# Patient Record
Sex: Male | Born: 1970 | Race: Black or African American | Hispanic: No | Marital: Married | Smoking: Current every day smoker
Health system: Southern US, Community
[De-identification: ages and names within clinical notes are randomized; demographics above are authoritative.]

## PROBLEM LIST (undated history)

## (undated) DIAGNOSIS — L309 Dermatitis, unspecified: Secondary | ICD-10-CM

## (undated) DIAGNOSIS — M549 Dorsalgia, unspecified: Secondary | ICD-10-CM

## (undated) DIAGNOSIS — M1990 Unspecified osteoarthritis, unspecified site: Secondary | ICD-10-CM

## (undated) DIAGNOSIS — I10 Essential (primary) hypertension: Secondary | ICD-10-CM

## (undated) DIAGNOSIS — I219 Acute myocardial infarction, unspecified: Secondary | ICD-10-CM

## (undated) DIAGNOSIS — M5126 Other intervertebral disc displacement, lumbar region: Secondary | ICD-10-CM

## (undated) HISTORY — PX: NO PAST SURGERIES: SHX2092

---

## 2003-03-05 ENCOUNTER — Encounter: Payer: Self-pay | Admitting: Emergency Medicine

## 2003-03-05 ENCOUNTER — Emergency Department (HOSPITAL_COMMUNITY): Admission: EM | Admit: 2003-03-05 | Discharge: 2003-03-05 | Payer: Self-pay | Admitting: Emergency Medicine

## 2008-06-02 ENCOUNTER — Emergency Department (HOSPITAL_COMMUNITY): Admission: EM | Admit: 2008-06-02 | Discharge: 2008-06-02 | Payer: Self-pay | Admitting: Emergency Medicine

## 2008-09-21 ENCOUNTER — Emergency Department (HOSPITAL_COMMUNITY): Admission: EM | Admit: 2008-09-21 | Discharge: 2008-09-21 | Payer: Self-pay | Admitting: Emergency Medicine

## 2008-09-30 ENCOUNTER — Emergency Department (HOSPITAL_COMMUNITY): Admission: EM | Admit: 2008-09-30 | Discharge: 2008-09-30 | Payer: Self-pay | Admitting: Emergency Medicine

## 2008-10-04 ENCOUNTER — Emergency Department (HOSPITAL_COMMUNITY): Admission: EM | Admit: 2008-10-04 | Discharge: 2008-10-04 | Payer: Self-pay | Admitting: Emergency Medicine

## 2008-10-23 ENCOUNTER — Emergency Department (HOSPITAL_COMMUNITY): Admission: EM | Admit: 2008-10-23 | Discharge: 2008-10-23 | Payer: Self-pay | Admitting: Emergency Medicine

## 2008-12-11 ENCOUNTER — Emergency Department (HOSPITAL_COMMUNITY): Admission: EM | Admit: 2008-12-11 | Discharge: 2008-12-11 | Payer: Self-pay | Admitting: Emergency Medicine

## 2009-10-15 IMAGING — CR DG FEMUR 2V*L*
4 series · 4 of 4 positions shown · non-contrast
Comparison: None available.

CLINICAL DATA: Pain.

LEFT FEMUR - 2 VIEW

[view not recorded (1 of 4)]
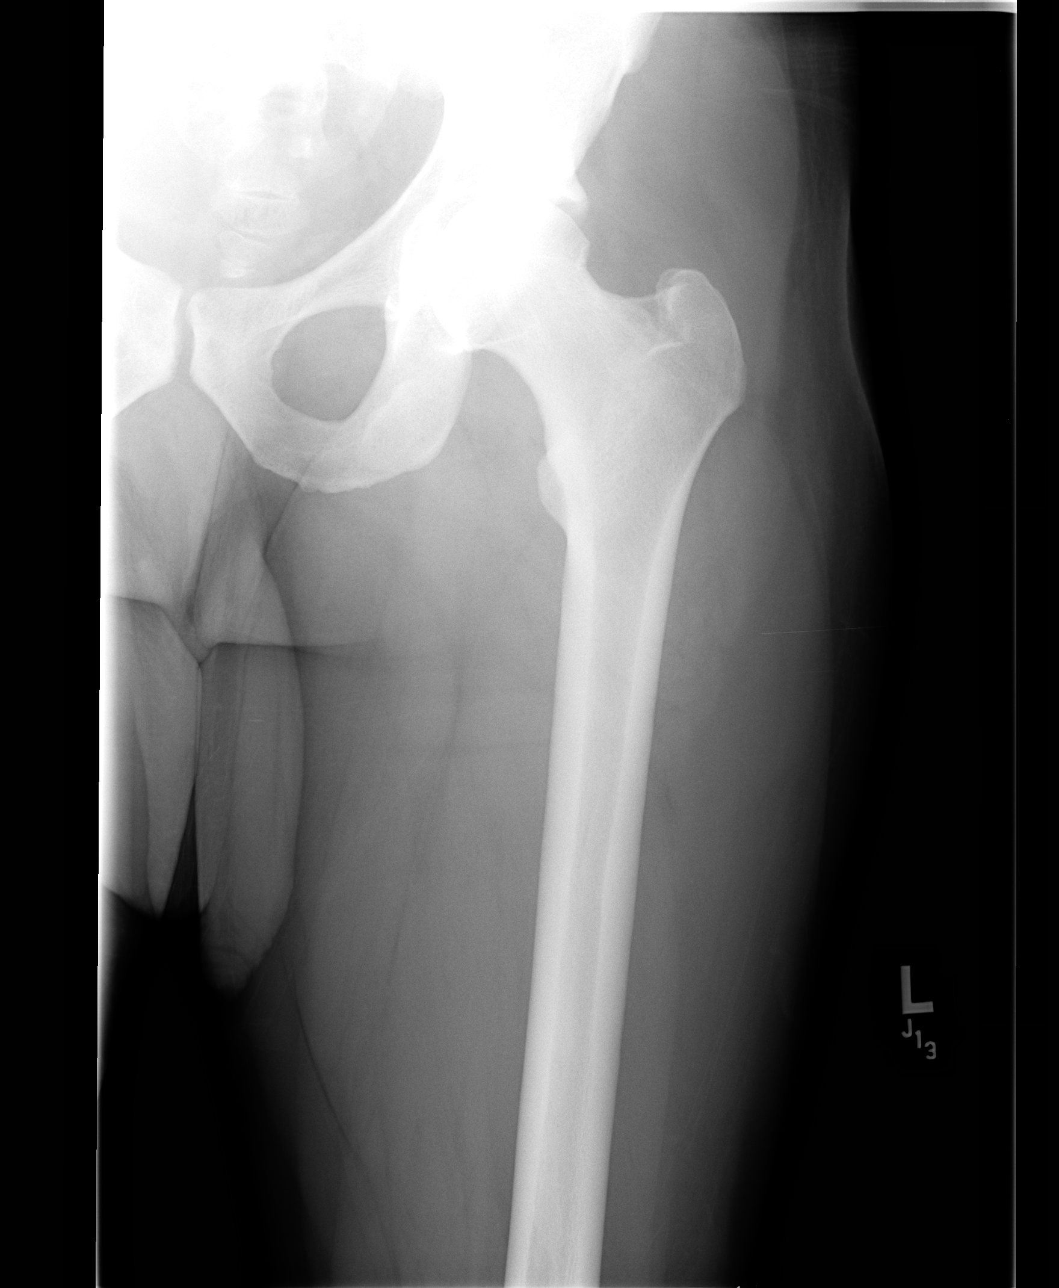

[view not recorded (2 of 4)]
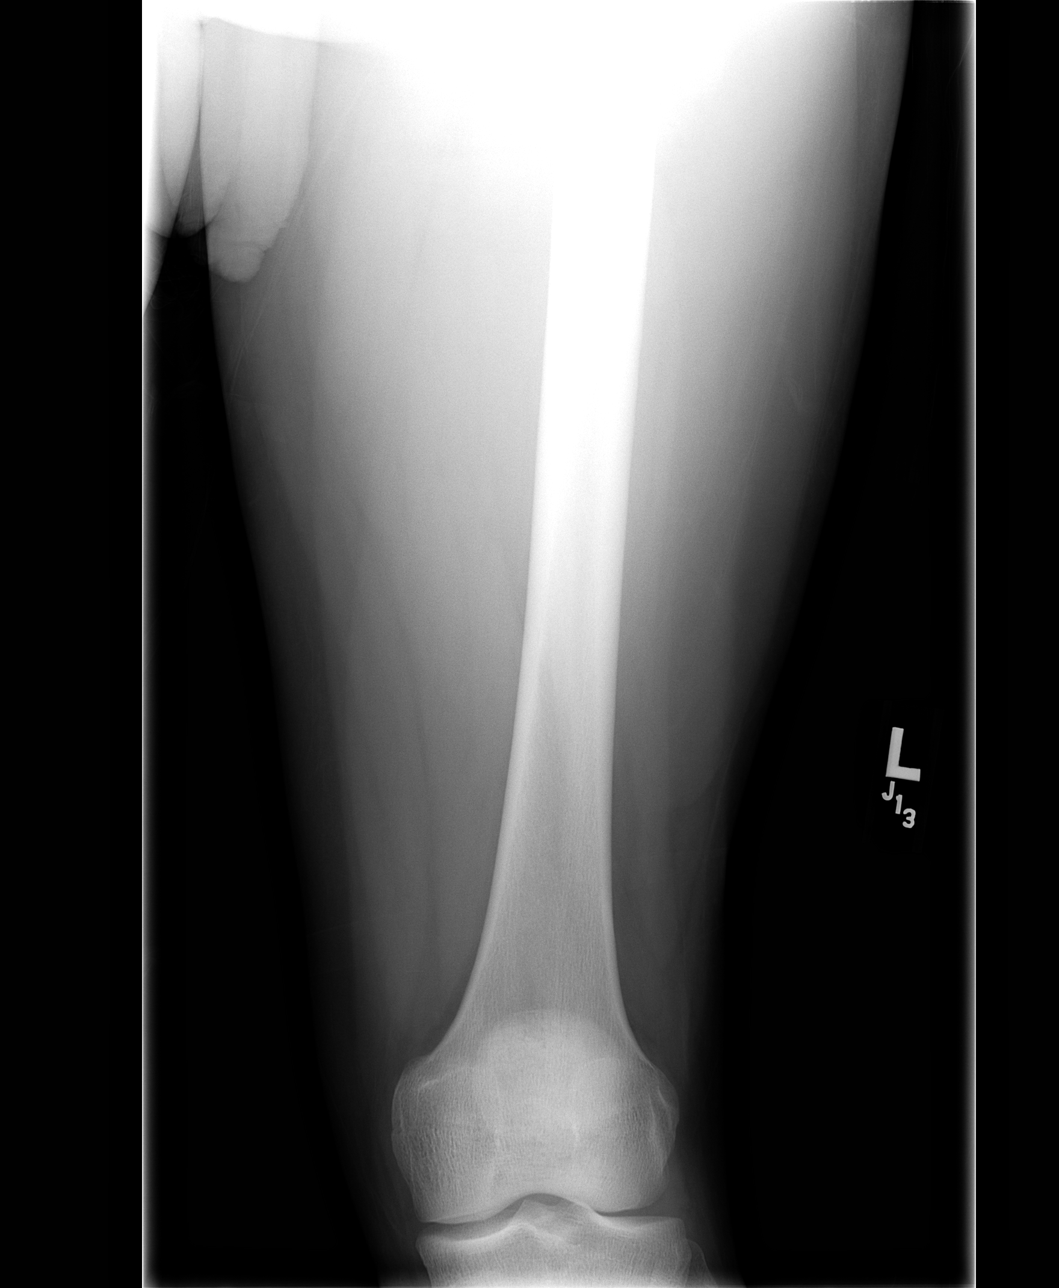

[view not recorded (3 of 4)]
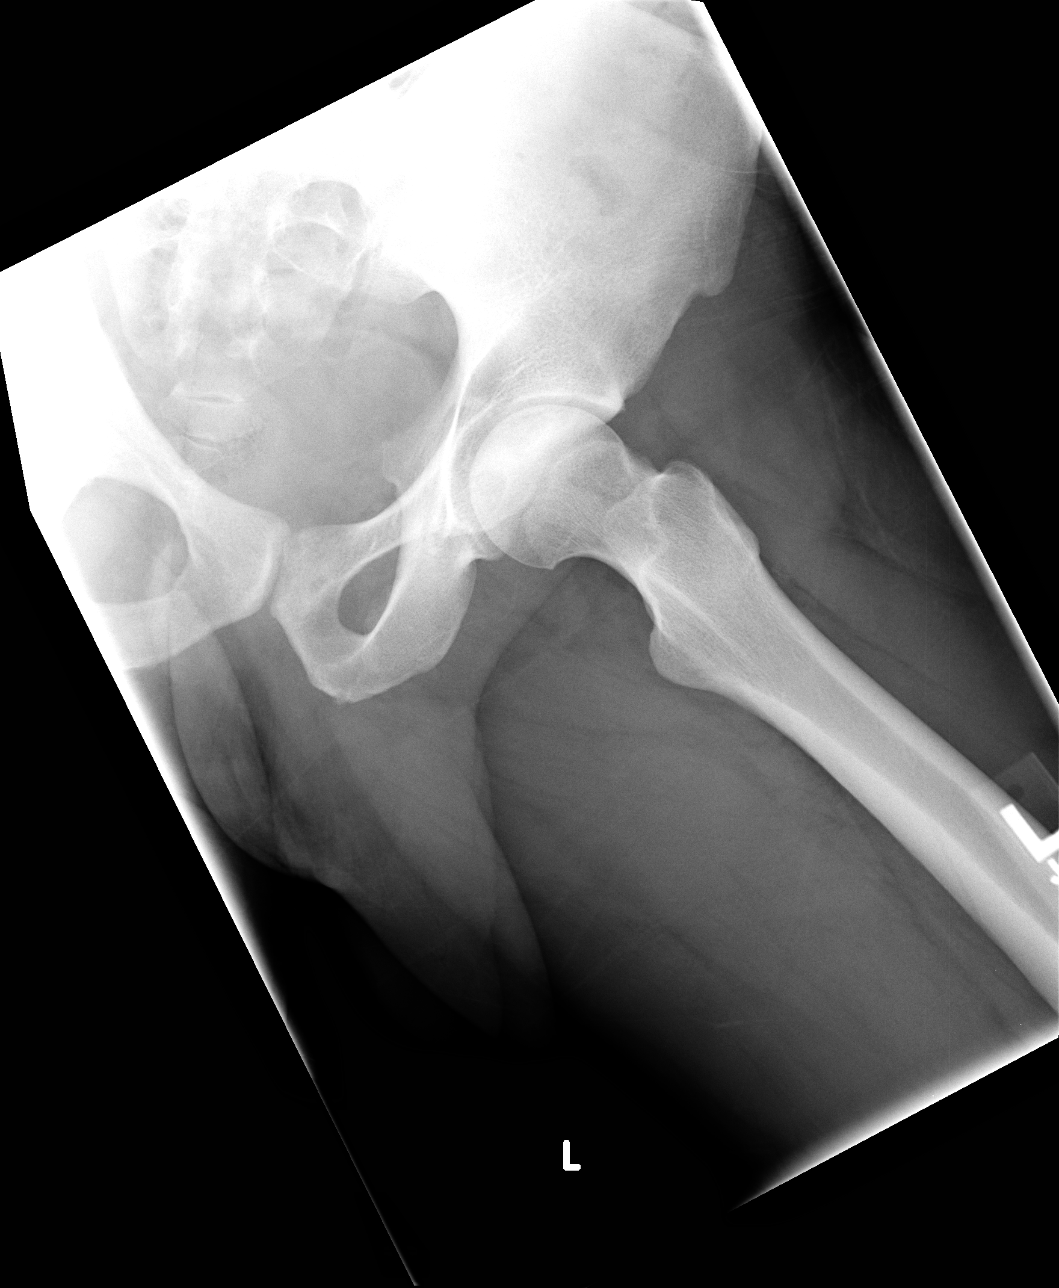

[view not recorded (4 of 4)]
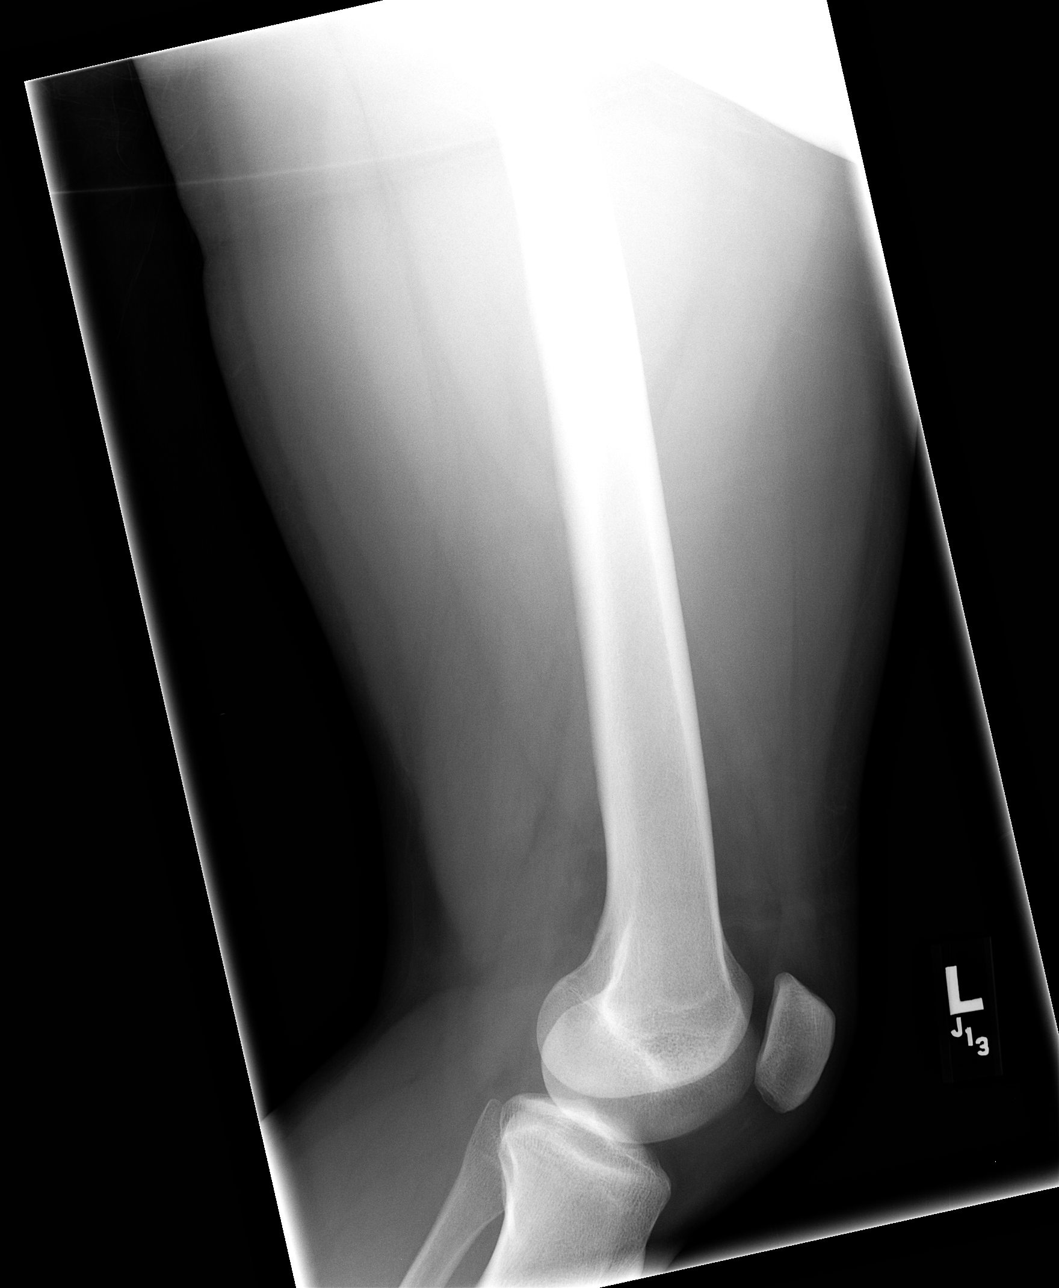

[4 of 4 positions shown; findings below may reference images not displayed]

FINDINGS: Imaged bones, joints and soft tissues appear normal.
IMPRESSION: Negative exam.

## 2010-02-01 ENCOUNTER — Emergency Department (HOSPITAL_COMMUNITY): Admission: EM | Admit: 2010-02-01 | Discharge: 2010-02-01 | Payer: Self-pay | Admitting: Emergency Medicine

## 2010-08-27 LAB — CBC
HCT: 42.5 % (ref 39.0–52.0)
Hemoglobin: 14.6 g/dL (ref 13.0–17.0)
MCHC: 34.3 g/dL (ref 30.0–36.0)
MCV: 89.7 fL (ref 78.0–100.0)
Platelets: 254 10*3/uL (ref 150–400)
RDW: 13.1 % (ref 11.5–15.5)

## 2010-08-27 LAB — RAPID URINE DRUG SCREEN, HOSP PERFORMED
Amphetamines: NOT DETECTED
Barbiturates: NOT DETECTED
Opiates: NOT DETECTED
Tetrahydrocannabinol: NOT DETECTED

## 2010-08-27 LAB — DIFFERENTIAL
Basophils Absolute: 0.1 10*3/uL (ref 0.0–0.1)
Basophils Relative: 1 % (ref 0–1)
Lymphocytes Relative: 41 % (ref 12–46)
Monocytes Absolute: 0.6 10*3/uL (ref 0.1–1.0)
Monocytes Relative: 7 % (ref 3–12)
Neutro Abs: 3.6 10*3/uL (ref 1.7–7.7)
Neutrophils Relative %: 47 % (ref 43–77)

## 2010-08-27 LAB — COMPREHENSIVE METABOLIC PANEL
Albumin: 4.4 g/dL (ref 3.5–5.2)
Alkaline Phosphatase: 90 U/L (ref 39–117)
BUN: 9 mg/dL (ref 6–23)
Creatinine, Ser: 1.19 mg/dL (ref 0.4–1.5)
Glucose, Bld: 88 mg/dL (ref 70–99)
Total Protein: 8.2 g/dL (ref 6.0–8.3)

## 2010-08-27 LAB — TYPE AND SCREEN
ABO/RH(D): O POS
Antibody Screen: NEGATIVE

## 2010-08-27 LAB — PROTIME-INR: INR: 1 (ref 0.00–1.49)

## 2010-10-01 NOTE — Consult Note (Signed)
NAMEMARLON, Tony Reynolds                ACCOUNT NO.:  [id number]   MEDICAL RECORD NO.:  [id number]          PATIENT TYPE:  EMS   LOCATION:  ED                            FACILITY:  [facility]   PHYSICIAN:  J. Darreld Mclean, M.D. DATE OF BIRTH:  1970/09/12   DATE OF CONSULTATION:  10/23/2008  DATE OF DISCHARGE:  10/23/2008                                 CONSULTATION   The patient seen in the ER at the request of Dr. [name].   The patient is a 40 year old male with a history of pain and tenderness  in his left knee.  It is progressive over the last several months.  He  denies any definitive trauma.  He has swelling at the knee.  He has  medial pain at the knee, it has altered his gait, and he is having some  back pain.  He has swelling at times, giving way at times, popping at  times.  He did have some ankle pain, but that is resolved.  X-rays today  are negative.  Please refer to the ER record for his vital signs and  other medical information and I will not repeat those, they can be  incorporated by reference.   His knee is stable.  Range of motion is from 5 degrees to 95 with pain.  He has got medial joint line tenderness.  Drawer sign is negative.  Lachman negative.  He has no distal edema.  He has got ankle bracelet  around his ankle, copper.  He has got some slight tenderness to his  back.  Range of motion of his back is good.  Reflex is normal.  CNS is  intact.  Skin in intact.   IMPRESSION:  Left knee pain, possible meniscus injury.   I have injected his left knee with solution of Xylocaine 1% and Depo-  Medrol 40, 1 mL.  He has knee immobilizer.  He is to use this.  Dr.  [name] has given some medication for pain.  I will see him in the  office next week,  for any difficulties he is to let me know.  He may  need an MRI to rule out meniscal injury.           ______________________________  Shela Commons. Darreld Mclean, M.D.     JWK/MEDQ  D:  10/23/2008  T:  10/24/2008  Job:  [id number]

## 2011-06-11 ENCOUNTER — Emergency Department (HOSPITAL_COMMUNITY)
Admission: EM | Admit: 2011-06-11 | Discharge: 2011-06-11 | Payer: Self-pay | Attending: Emergency Medicine | Admitting: Emergency Medicine

## 2011-06-11 ENCOUNTER — Encounter (HOSPITAL_COMMUNITY): Payer: Self-pay

## 2011-06-11 DIAGNOSIS — M25559 Pain in unspecified hip: Secondary | ICD-10-CM | POA: Insufficient documentation

## 2011-06-11 DIAGNOSIS — M25569 Pain in unspecified knee: Secondary | ICD-10-CM

## 2011-06-11 MED ORDER — OXYCODONE-ACETAMINOPHEN 5-325 MG PO TABS: 1.0000 | ORAL_TABLET | ORAL | Status: AC | PRN

## 2011-06-11 MED ORDER — IBUPROFEN 800 MG PO TABS: 800.0000 mg | ORAL_TABLET | Freq: Three times a day (TID) | ORAL | Status: AC

## 2011-06-11 MED ORDER — IBUPROFEN 800 MG PO TABS
800.0000 mg | ORAL_TABLET | Freq: Once | ORAL | Status: AC
  Administered 2011-06-11: 800 mg via ORAL
  Filled 2011-06-11: qty 1

## 2011-06-11 MED ORDER — OXYCODONE-ACETAMINOPHEN 5-325 MG PO TABS
1.0000 | ORAL_TABLET | Freq: Once | ORAL | Status: AC
  Administered 2011-06-11: 1 via ORAL
  Filled 2011-06-11: qty 1

## 2011-06-11 NOTE — ED Notes (Signed)
Pt asking about wait, says he needs to leave.  Mayer Camel, NP, notified.

## 2011-06-11 NOTE — ED Notes (Signed)
Alert, NAd, sitting up in chair, c/o pain rt thigh, says no known injury. Has had x-ray of hip for this pain.in past. And injection in rt knee.

## 2011-06-11 NOTE — ED Notes (Signed)
Pt says has been having pain in both hips over the past year.  Says pain getting worse.  Reports woke up this am and r leg was numb.

## 2011-06-11 NOTE — ED Provider Notes (Signed)
History     CSN: [id number]  Arrival date & time 06/11/11  1011   First MD Initiated Contact with Patient 06/11/11 1123      Chief Complaint  Patient presents with  . Hip Pain    HPILeonard KRISHNA DANCEL is a 41 y.o. male who presents to the ED for right knee pain. 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 The pain starts in the right knee and radiates to the hip. He denies fever, chills or other problems. The pain is better with rest and worse with activity. The history was provided by the patient.  History reviewed. No pertinent past medical history.  History reviewed. No pertinent past surgical history.  No family history on file.  History  Substance Use Topics  . Smoking status: Current Everyday Smoker  . Smokeless tobacco: Not on file  . Alcohol Use: Yes     occ      Review of Systems  Musculoskeletal:       Right knee, thigh and hip pain.  All other systems reviewed and are negative.    Allergies  Review of patient's allergies indicates no known allergies.  Home Medications  No current outpatient prescriptions on file.  BP 148/99  Pulse 67  Temp(Src) 98.4 F (36.9 C) (Oral)  Resp 18  Ht 5\' 6"  (1.676 m)  Wt 179 lb (81.194 kg)  BMI 28.89 kg/m2  SpO2 100%  Physical Exam  Nursing note and vitals reviewed. Constitutional: He is oriented to person, place, and time. He appears well-developed and well-nourished. No distress.  HENT:  Head: Normocephalic and atraumatic.  Eyes: EOM are normal.  Neck: Neck supple.  Cardiovascular: Normal rate.   Pulmonary/Chest: Effort normal.  Abdominal: Soft. There is no tenderness.  Musculoskeletal:       Right knee: He exhibits decreased range of motion. He exhibits no swelling, no effusion, no deformity and no erythema.       Pain with ROM of right knee. Tender lateral aspect on palpation. Unable to  reproduce the pain on palpation that the patient describes in the thigh and hip. Only present with ambulation. Full ROM of back without pain. Pedal pulses present and equal. Adequate circulation.   Neurological: He is alert and oriented to person, place, and time. No cranial nerve deficit.  Skin: Skin is warm and dry.  Psychiatric: He has a normal mood and affect. His behavior is normal. Judgment and thought content normal.   Assessment: Knee pain  Plan:  Knee immobilizer   Ibuprofen   Percocet   Follow up with ortho ED Course  Procedures    MDM          Kerrie Buffalo, NP 06/11/11 1215

## 2011-06-11 NOTE — ED Notes (Signed)
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  And that the KI will impede his walking.

## 2011-06-14 NOTE — ED Provider Notes (Signed)
Medical screening examination/treatment/procedure(s) were performed by non-physician practitioner and as supervising physician I was immediately available for consultation/collaboration.  Angeleah Labrake L Richa Shor, MD 06/14/11 1549 

## 2011-09-16 ENCOUNTER — Emergency Department (HOSPITAL_COMMUNITY)
Admission: EM | Admit: 2011-09-16 | Discharge: 2011-09-16 | Disposition: A | Discharge status: Home / Self Care | Payer: Self-pay | Attending: Emergency Medicine | Admitting: Emergency Medicine

## 2011-09-16 ENCOUNTER — Encounter (HOSPITAL_COMMUNITY): Payer: Self-pay

## 2011-09-16 DIAGNOSIS — M79609 Pain in unspecified limb: Secondary | ICD-10-CM | POA: Insufficient documentation

## 2011-09-16 DIAGNOSIS — M79651 Pain in right thigh: Secondary | ICD-10-CM

## 2011-09-16 DIAGNOSIS — F17200 Nicotine dependence, unspecified, uncomplicated: Secondary | ICD-10-CM | POA: Insufficient documentation

## 2011-09-16 MED ORDER — NAPROXEN 500 MG PO TABS: 500.0000 mg | ORAL_TABLET | Freq: Two times a day (BID) | ORAL | Status: DC

## 2011-09-16 MED ORDER — HYDROCODONE-ACETAMINOPHEN 5-325 MG PO TABS: 1.0000 | ORAL_TABLET | ORAL | Status: AC | PRN

## 2011-09-16 MED ORDER — CYCLOBENZAPRINE HCL 10 MG PO TABS: 10.0000 mg | ORAL_TABLET | Freq: Three times a day (TID) | ORAL | Status: AC | PRN

## 2011-09-16 NOTE — ED Notes (Signed)
Right leg pain off/on for "a long time", reports being to er several times for same, denies any known injury

## 2011-09-16 NOTE — ED Provider Notes (Signed)
History   This chart was scribed for Dione Booze, MD by Sofie Rower. The patient was seen in room [facility] and the patient's care was started at 9:11 AM     CSN: [id number]  Arrival date & time 09/16/11  8413   First MD Initiated Contact with Patient 09/16/11 [phone]      Chief Complaint  Patient presents with  . Leg Pain    (Consider location/radiation/quality/duration/timing/severity/associated sxs/prior treatment) HPI  Tony Reynolds is a 41 y.o. male who presents to the Emergency Department complaining of moderate, intermittent leg pain located in the right leg onset today with associated symptoms of right hip pain, difficulty sleeping due to pain. The pt states "the pain has began acting up this month, progressively getting worse this last week." The pt describes the pain as an "aching pain." The pt informs the EDP that "the pain is a 8.5/10". Modifying factors include raising and lowering the right leg which intensifies the pain, sitting too long which intensifies the pain. Pt has a hx of falling off of a truck in 2009, hx of being seen for similar symptoms at [facility] on 06/11/11.   Pt denies any known recent injury.   The pt states "he drinks a beer every now and then." Pt also informs EDP that he "is a heavy smoker." Smokes 1/2 ppd. Pt also informs EDP "he was unable to follow up with Orthopedist following visit at [facility] on 06/11/11."  History  Substance Use Topics  . Smoking status: Current Everyday Smoker    Types: Cigarettes  . Smokeless tobacco: Not on file  . Alcohol Use: Yes     occ      Review of Systems  All other systems reviewed and are negative.   10 Systems reviewed and all are negative for acute change except as noted in the HPI.    Allergies  Review of patient's allergies indicates no known allergies.  Home Medications  No current outpatient prescriptions on file.  BP 143/91  Pulse 76  Temp(Src) 98.4 F (36.9 C) (Oral)  Resp 18  Ht 5\' 6"   (1.676 m)  Wt 175 lb (79.379 kg)  BMI 28.25 kg/m2  SpO2 100%  Physical Exam  Nursing note and vitals reviewed. Constitutional: He is oriented to person, place, and time. He appears well-developed and well-nourished.  HENT:  Head: Normocephalic and atraumatic.  Right Ear: External ear normal.  Left Ear: External ear normal.  Nose: Nose normal.  Eyes: Conjunctivae and EOM are normal. Right eye exhibits no discharge. Left eye exhibits no discharge.  Neck: Normal range of motion. Neck supple.  Cardiovascular: Normal rate, regular rhythm and normal heart sounds.   Pulmonary/Chest: Effort normal.  Abdominal: Soft.  Musculoskeletal: He exhibits tenderness (Mild pain with range of motion of the right hip. Negative straght leg raise test. ). He exhibits no edema.  Neurological: He is alert and oriented to person, place, and time.  Skin: Skin is warm and dry.  Psychiatric: He has a normal mood and affect. His behavior is normal.    ED Course  Procedures (including critical care time)  DIAGNOSTIC STUDIES: Oxygen Saturation is 100% on room air, normal by my interpretation.    COORDINATION OF CARE:    1. Pain of right thigh     9:21AM- EDP at bedside discusses treatment plan concerning pain management and physical therapy.   MDM  Chronic right leg pain. Old records were reviewed and he was seen in January at which time  femur x-ray was negative. Do not see any indication for additional imaging at this point. He states he got no relief from ibuprofen and Percocet which was prescribed previously. You will be sent home with a prescription for Norco, naproxen, and Flexeril. I feel that he is most likely to get benefit from physical therapy. He was referred to Dr. Romeo Apple is on call for orthopedics. Of note, he had been referred to Dr. Romeo Apple previously and had not made a followup appointment.      I personally performed the services described in this documentation, which was scribed in  my presence. The recorded information has been reviewed and considered.        Dione Booze, MD 09/16/11 0930

## 2011-09-16 NOTE — Discharge Instructions (Signed)
Please make a followup appointment with the orthopedic doctor, Dr. Romeo Apple. Your leg pain has been going on a long time and probably will need physical therapy to get it improved and this needs to be set up through a physician.  Smoking Hazards Smoking cigarettes is extremely bad for your health. Tobacco smoke has over 200 known poisons in it. There are over 60 chemicals in tobacco smoke that cause cancer. Some of the chemicals found in cigarette smoke include:   Cyanide.   Benzene.   Formaldehyde.   Methanol (wood alcohol).   Acetylene (fuel used in welding torches).   Ammonia.  Cigarette smoke also contains the poisonous gases nitrogen oxide and carbon monoxide.  Cigarette smokers have an increased risk of many serious medical problems, including:  Lung cancer.   Lung disease (such as pneumonia, bronchitis, and emphysema).   Heart attack and chest pain due to the heart not getting enough oxygen (angina).   Heart disease and peripheral blood vessel disease.   Hypertension.   Stroke.   Oral cancer (cancer of the lip, mouth, or voice box).   Bladder cancer.   Pancreatic cancer.   Cervical cancer.   Pregnancy complications, including premature birth.   Low birthweight babies.   Early menopause.   Lower estrogen level for women.   Infertility.   Facial wrinkles.   Blindness.   Increased risk of broken bones (fractures).   Senile dementia.   Stillbirths and smaller newborn babies, birth defects, and genetic damage to sperm.   Stomach ulcers and internal bleeding.  Children of smokers have an increased risk of the following, because of secondhand smoke exposure:   Sudden infant death syndrome (SIDS).   Respiratory infections.   Lung cancer.   Heart disease.   Ear infections.  Smoking causes approximately:  90% of all lung cancer deaths in men.   80% of all lung cancer deaths in women.   90% of deaths from chronic obstructive lung disease.    Compared with nonsmokers, smoking increases the risk of:  Coronary heart disease by 2 to 4 times.   Stroke by 2 to 4 times.   Men developing lung cancer by 23 times.   Women developing lung cancer by 13 times.   Dying from chronic obstructive lung diseases by 12 times.  Someone who smokes 2 packs a day loses about 8 years of his or her life. Even smoking lightly shortens your life expectancy by several years. You can greatly reduce the risk of medical problems for you and your family by stopping now. Smoking is the most preventable cause of death and disease in our society. Within days of quitting smoking, your circulation returns to normal, you decrease the risk of having a heart attack, and your lung capacity improves. There may be some increased phlegm in the first few days after quitting, and it may take months for your lungs to clear up completely. Quitting for 10 years cuts your lung cancer risk to almost that of a nonsmoker. WHY IS SMOKING ADDICTIVE?  Nicotine is the chemical agent in tobacco that is capable of causing addiction or dependence.   When you smoke and inhale, nicotine is absorbed rapidly into the bloodstream through your lungs. Nicotine absorbed through the lungs is capable of creating a powerful addiction. Both inhaled and non-inhaled nicotine may be addictive.   Addiction studies of cigarettes and spit tobacco show that addiction to nicotine occurs mainly during the teen years, when young people begin using tobacco products.  WHAT ARE THE BENEFITS OF QUITTING?  There are many health benefits to quitting smoking.   Likelihood of developing cancer and heart disease decreases. Health improvements are seen almost immediately.   Blood pressure, pulse rate, and breathing patterns start returning to normal soon after quitting.   People who quit may see an improvement in their overall quality of life.  Some people choose to quit all at once. Other options include nicotine  replacement products, such as patches, gum, and nasal sprays. Do not use these products without first checking with your caregiver. QUITTING SMOKING It is not easy to quit smoking. Nicotine is addicting, and longtime habits are hard to change. To start, you can write down all your reasons for quitting, tell your family and friends you want to quit, and ask for their help. Throw your cigarettes away, chew gum or cinnamon sticks, keep your hands busy, and drink extra water or juice. Go for walks and practice deep breathing to relax. Think of all the money you are saving: around $1,000 a year, for the average pack-a-day smoker. Nicotine patches and gum have been shown to improve success at efforts to stop smoking. Zyban (bupropion) is an anti-depressant drug that can be prescribed to reduce nicotine withdrawal symptoms and to suppress the urge to smoke. Smoking is an addiction with both physical and psychological effects. Joining a stop-smoking support group can help you cope with the emotional issues. For more information and advice on programs to stop smoking, call your doctor, your local hospital, or these organizations:  American Lung Association - 1-800-LUNGUSA   American [employer] Society - 1-800-ACS-2345  Document Released: 06/12/2004 Document Revised: 04/24/2011 Document Reviewed: 02/14/2009 [facility] Patient Information 2012 [place], [place].  Naproxen and naproxen sodium oral immediate-release tablets What is this medicine? NAPROXEN (na PROX en) is a non-steroidal anti-inflammatory drug (NSAID). It is used to reduce swelling and to treat pain. This medicine may be used for dental pain, headache, or painful monthly periods. It is also used for painful joint and muscular problems such as arthritis, tendinitis, bursitis, and gout. This medicine may be used for other purposes; ask your health care provider or pharmacist if you have questions. What should I tell my health care provider before I take this  medicine? They need to know if you have any of these conditions: -asthma -cigarette smoker -drink more than 3 alcohol containing drinks a day -heart disease or circulation problems such as heart failure or leg edema (fluid retention) -high blood pressure -kidney disease -liver disease -stomach bleeding or ulcers -an unusual or allergic reaction to naproxen, aspirin, other NSAIDs, other medicines, foods, dyes, or preservatives -pregnant or trying to get pregnant -breast-feeding How should I use this medicine? Take this medicine by mouth with a glass of water. Follow the directions on the prescription label. Take it with food if your stomach gets upset. Try to not lie down for at least 10 minutes after you take it. Take your medicine at regular intervals. Do not take your medicine more often than directed. Long-term, continuous use may increase the risk of heart attack or stroke. A special MedGuide will be given to you by the pharmacist with each prescription and refill. Be sure to read this information carefully each time. Talk to your pediatrician regarding the use of this medicine in children. Special care may be needed. Overdosage: If you think you have taken too much of this medicine contact a poison control center or emergency room at once. NOTE: This medicine is  only for you. Do not share this medicine with others. What if I miss a dose? If you miss a dose, take it as soon as you can. If it is almost time for your next dose, take only that dose. Do not take double or extra doses. What may interact with this medicine? -alcohol -aspirin -cidofovir -diuretics -lithium -methotrexate -other drugs for inflammation like ketorolac or prednisone -pemetrexed -probenecid -warfarin This list may not describe all possible interactions. Give your health care provider a list of all the medicines, herbs, non-prescription drugs, or dietary supplements you use. Also tell them if you smoke, drink  alcohol, or use illegal drugs. Some items may interact with your medicine. What should I watch for while using this medicine? Tell your doctor or health care professional if your pain does not get better. Talk to your doctor before taking another medicine for pain. Do not treat yourself. This medicine does not prevent heart attack or stroke. In fact, this medicine may increase the chance of a heart attack or stroke. The chance may increase with longer use of this medicine and in people who have heart disease. If you take aspirin to prevent heart attack or stroke, talk with your doctor or health care professional. Do not take other medicines that contain aspirin, ibuprofen, or naproxen with this medicine. Side effects such as stomach upset, nausea, or ulcers may be more likely to occur. Many medicines available without a prescription should not be taken with this medicine. This medicine can cause ulcers and bleeding in the stomach and intestines at any time during treatment. Do not smoke cigarettes or drink alcohol. These increase irritation to your stomach and can make it more susceptible to damage from this medicine. Ulcers and bleeding can happen without warning symptoms and can cause death. You may get drowsy or dizzy. Do not drive, use machinery, or do anything that needs mental alertness until you know how this medicine affects you. Do not stand or sit up quickly, especially if you are an older patient. This reduces the risk of dizzy or fainting spells. This medicine can cause you to bleed more easily. Try to avoid damage to your teeth and gums when you brush or floss your teeth. What side effects may I notice from receiving this medicine? Side effects that you should report to your doctor or health care professional as soon as possible: -black or bloody stools, blood in the urine or vomit -blurred vision -chest pain -difficulty breathing or wheezing -nausea or vomiting -severe stomach  pain -skin rash, skin redness, blistering or peeling skin, hives, or itching -slurred speech or weakness on one side of the body -swelling of eyelids, throat, lips -unexplained weight gain or swelling -unusually weak or tired -yellowing of eyes or skin Side effects that usually do not require medical attention (report to your doctor or health care professional if they continue or are bothersome): -constipation -headache -heartburn This list may not describe all possible side effects. Call your doctor for medical advice about side effects. You may report side effects to FDA at 1-800-FDA-1088. Where should I keep my medicine? Keep out of the reach of children. Store at room temperature between 15 and 30 degrees C (59 and 86 degrees F). Keep container tightly closed. Throw away any unused medicine after the expiration date. NOTE: This sheet is a summary. It may not cover all possible information. If you have questions about this medicine, talk to your doctor, pharmacist, or health care provider.  2012, [employer]  Standard. (05/07/2009 8:10:16 PM)  Acetaminophen; Hydrocodone tablets or capsules What is this medicine? ACETAMINOPHEN; HYDROCODONE (a set a MEE noe fen; hye droe KOE done) is a pain reliever. It is used to treat mild to moderate pain. This medicine may be used for other purposes; ask your health care provider or pharmacist if you have questions. What should I tell my health care provider before I take this medicine? They need to know if you have any of these conditions: -brain tumor -Crohn's disease, inflammatory bowel disease, or ulcerative colitis -drink more than 3 alcohol-containing drinks per day -drug abuse or addiction -head injury -heart or circulation problems -kidney disease or problems going to the bathroom -liver disease -lung disease, asthma, or breathing problems -an unusual or allergic reaction to acetaminophen, hydrocodone, other opioid analgesics, other  medicines, foods, dyes, or preservatives -pregnant or trying to get pregnant -breast-feeding How should I use this medicine? Take this medicine by mouth. Swallow it with a full glass of water. Follow the directions on the prescription label. If the medicine upsets your stomach, take the medicine with food or milk. Do not take more than you are told to take. Talk to your pediatrician regarding the use of this medicine in children. This medicine is not approved for use in children. Overdosage: If you think you have taken too much of this medicine contact a poison control center or emergency room at once. NOTE: This medicine is only for you. Do not share this medicine with others. What if I miss a dose? If you miss a dose, take it as soon as you can. If it is almost time for your next dose, take only that dose. Do not take double or extra doses. What may interact with this medicine? -alcohol or medicines that contain alcohol -antihistamines -isoniazid -medicines for depression, anxiety, or psychotic disturbances -medicines for pain including pentazocine, buprenorphine, butorphanol, nalbuphine, tramadol, and propoxyphene -medicines for sleep -muscle relaxants -naltrexone -phenobarbital -ritonavir This list may not describe all possible interactions. Give your health care provider a list of all the medicines, herbs, non-prescription drugs, or dietary supplements you use. Also tell them if you smoke, drink alcohol, or use illegal drugs. Some items may interact with your medicine. What should I watch for while using this medicine? Tell your doctor or health care professional if your pain does not go away, if it gets worse, or if you have new or a different type of pain. You may develop tolerance to the medicine. Tolerance means that you will need a higher dose of the medicine for pain relief. Tolerance is normal and is expected if you take the medicine for a long time. Do not suddenly stop taking  your medicine because you may develop a severe reaction. Your body becomes used to the medicine. This does NOT mean you are addicted. Addiction is a behavior related to getting and using a drug for a non-medical reason. If you have pain, you have a medical reason to take pain medicine. Your doctor will tell you how much medicine to take. If your doctor wants you to stop the medicine, the dose will be slowly lowered over time to avoid any side effects. You may get drowsy or dizzy when you first start taking the medicine or change doses. Do not drive, use machinery, or do anything that may be dangerous until you know how the medicine affects you. Stand or sit up slowly. The medicine will cause constipation. Try to have a bowel movement at least every  2 to 3 days. If you do not have a bowel movement for 3 days, call your doctor or health care professional. Too much acetaminophen can be very dangerous. Do not take Tylenol (acetaminophen) or medicines that contain acetaminophen with this medicine. Many non-prescription medicines contain acetaminophen. Always read the labels carefully. What side effects may I notice from receiving this medicine? Side effects that you should report to your doctor or health care professional as soon as possible: -allergic reactions like skin rash, itching or hives, swelling of the face, lips, or tongue -breathing problems -confusion -feeling faint or lightheaded, falls -stomach pain -yellowing of the eyes or skin Side effects that usually do not require medical attention (report to your doctor or health care professional if they continue or are bothersome): -nausea, vomiting -stomach upset This list may not describe all possible side effects. Call your doctor for medical advice about side effects. You may report side effects to FDA at 1-800-FDA-1088. Where should I keep my medicine? Keep out of the reach of children. This medicine can be abused. Keep your medicine in a safe  place to protect it from theft. Do not share this medicine with anyone. Selling or giving away this medicine is dangerous and against the law. Store at room temperature between 15 and 30 degrees C (59 and 86 degrees F). Protect from light. Keep container tightly closed. Throw away any unused medicine after the expiration date. NOTE: This sheet is a summary. It may not cover all possible information. If you have questions about this medicine, talk to your doctor, pharmacist, or health care provider.  2012, [employer] Standard. (07/27/2007 10:25:07 AM)   Cyclobenzaprine tablets What is this medicine? CYCLOBENZAPRINE (sye kloe BEN za preen) is a muscle relaxer. It is used to treat muscle pain, spasms, and stiffness. This medicine may be used for other purposes; ask your health care provider or pharmacist if you have questions. What should I tell my health care provider before I take this medicine? They need to know if you have any of these conditions: -heart disease, irregular heartbeat, or previous heart attack -liver disease -thyroid problem -an unusual or allergic reaction to cyclobenzaprine, tricyclic antidepressants, lactose, other medicines, foods, dyes, or preservatives -pregnant or trying to get pregnant -breast-feeding How should I use this medicine? Take this medicine by mouth with a glass of water. Follow the directions on the prescription label. If this medicine upsets your stomach, take it with food or milk. Take your medicine at regular intervals. Do not take it more often than directed. Talk to your pediatrician regarding the use of this medicine in children. Special care may be needed. Overdosage: If you think you have taken too much of this medicine contact a poison control center or emergency room at once. NOTE: This medicine is only for you. Do not share this medicine with others. What if I miss a dose? If you miss a dose, take it as soon as you can. If it is almost time  for your next dose, take only that dose. Do not take double or extra doses. What may interact with this medicine? Do not take this medicine with any of the following medications: -cisapride -droperidol -flecainide -grepafloxacin -halofantrine -levomethadyl -MAOIs like Carbex, Eldepryl, Marplan, Nardil, and Parnate -nilotinib -pimozide -probucol -sertindole This medicine may also interact with the following medications: -abarelix -alcohol -contrast dyes -dolasetron -guanethidine -medicines for cancer -medicines for depression, anxiety, or psychotic disturbances -medicines to treat an irregular heartbeat -medicines used for sleep or numbness  during surgery or procedure -methadone -octreotide -ondansetron -palonosetron -phenothiazines like chlorpromazine, mesoridazine, prochlorperazine, thioridazine -some medicines for infection like alfuzosin, chloroquine, clarithromycin, levofloxacin, mefloquine, pentamidine, troleandomycin -tramadol -vardenafil This list may not describe all possible interactions. Give your health care provider a list of all the medicines, herbs, non-prescription drugs, or dietary supplements you use. Also tell them if you smoke, drink alcohol, or use illegal drugs. Some items may interact with your medicine. What should I watch for while using this medicine? Check with your doctor or health care professional if your condition does not improve within 1 to 3 weeks. You may get drowsy or dizzy when you first start taking the medicine or change doses. Do not drive, use machinery, or do anything that may be dangerous until you know how the medicine affects you. Stand or sit up slowly. Your mouth may get dry. Drinking water, chewing sugarless gum, or sucking on hard candy may help. What side effects may I notice from receiving this medicine? Side effects that you should report to your doctor or health care professional as soon as possible: -allergic reactions like  skin rash, itching or hives, swelling of the face, lips, or tongue -chest pain -fast heartbeat -hallucinations -seizures -vomiting Side effects that usually do not require medical attention (report to your doctor or health care professional if they continue or are bothersome): -headache This list may not describe all possible side effects. Call your doctor for medical advice about side effects. You may report side effects to FDA at 1-800-FDA-1088. Where should I keep my medicine? Keep out of the reach of children. Store at room temperature between 15 and 30 degrees C (59 and 86 degrees F). Keep container tightly closed. Throw away any unused medicine after the expiration date. NOTE: This sheet is a summary. It may not cover all possible information. If you have questions about this medicine, talk to your doctor, pharmacist, or health care provider.  2012, [employer] Standard. (08/16/2007 10:26:21 PM)

## 2011-12-01 ENCOUNTER — Encounter (HOSPITAL_COMMUNITY): Payer: Self-pay | Admitting: *Deleted

## 2011-12-01 ENCOUNTER — Emergency Department (HOSPITAL_COMMUNITY)
Admission: EM | Admit: 2011-12-01 | Discharge: 2011-12-01 | Discharge status: Against Medical Advice | Payer: Self-pay | Attending: Emergency Medicine | Admitting: Emergency Medicine

## 2011-12-01 DIAGNOSIS — Z0389 Encounter for observation for other suspected diseases and conditions ruled out: Secondary | ICD-10-CM | POA: Insufficient documentation

## 2011-12-01 HISTORY — DX: Dermatitis, unspecified: L30.9

## 2011-12-01 NOTE — ED Notes (Signed)
No answer

## 2011-12-01 NOTE — ED Notes (Signed)
Eczema rash to arms and neck. For 5 days

## 2011-12-02 ENCOUNTER — Encounter (HOSPITAL_COMMUNITY): Payer: Self-pay

## 2011-12-02 ENCOUNTER — Emergency Department (HOSPITAL_COMMUNITY)
Admission: EM | Admit: 2011-12-02 | Discharge: 2011-12-02 | Disposition: A | Discharge status: Home / Self Care | Payer: Self-pay | Attending: Emergency Medicine | Admitting: Emergency Medicine

## 2011-12-02 DIAGNOSIS — L309 Dermatitis, unspecified: Secondary | ICD-10-CM

## 2011-12-02 DIAGNOSIS — R21 Rash and other nonspecific skin eruption: Secondary | ICD-10-CM | POA: Insufficient documentation

## 2011-12-02 DIAGNOSIS — F17200 Nicotine dependence, unspecified, uncomplicated: Secondary | ICD-10-CM | POA: Insufficient documentation

## 2011-12-02 MED ORDER — DEXAMETHASONE 4 MG PO TABS: ORAL_TABLET | ORAL | Status: DC

## 2011-12-02 MED ORDER — DEXAMETHASONE SODIUM PHOSPHATE 4 MG/ML IJ SOLN
8.0000 mg | Freq: Once | INTRAMUSCULAR | Status: AC
  Administered 2011-12-02: 8 mg via INTRAMUSCULAR
  Filled 2011-12-02: qty 2

## 2011-12-02 MED ORDER — HYDROXYZINE PAMOATE 25 MG PO CAPS: ORAL_CAPSULE | ORAL | Status: DC

## 2011-12-02 NOTE — ED Provider Notes (Signed)
Medical screening examination/treatment/procedure(s) were performed by non-physician practitioner and as supervising physician I was immediately available for consultation/collaboration.   Lynda Wanninger L Nikkie Liming, MD 12/02/11 0828 

## 2011-12-02 NOTE — ED Notes (Addendum)
Pt reports has eczema and working out in the heat has caused it to flare up.

## 2011-12-02 NOTE — ED Provider Notes (Signed)
History     CSN: [id number]  Arrival date & time 12/02/11  7846   First MD Initiated Contact with Patient 12/02/11 [phone]      Chief Complaint  Patient presents with  . Rash    (Consider location/radiation/quality/duration/timing/severity/associated sxs/prior treatment) Patient is a 41 y.o. male presenting with rash. The history is provided by the patient.  Rash  This is a recurrent problem. The current episode started more than 1 week ago. The problem has been gradually worsening. Associated with: Pt has hx of eczema.  There has been no fever. The rash is present on the neck, back, left arm and right arm. The patient is experiencing no pain. Associated symptoms include itching.    Past Medical History  Diagnosis Date  . Eczema     History reviewed. No pertinent past surgical history.  No family history on file.  History  Substance Use Topics  . Smoking status: Current Everyday Smoker    Types: Cigarettes  . Smokeless tobacco: Not on file  . Alcohol Use: Yes     occ      Review of Systems  Constitutional: Negative for activity change.       All ROS Neg except as noted in HPI  HENT: Negative for nosebleeds and neck pain.   Eyes: Negative for photophobia and discharge.  Respiratory: Negative for cough, shortness of breath and wheezing.   Cardiovascular: Negative for chest pain and palpitations.  Gastrointestinal: Negative for abdominal pain and blood in stool.  Genitourinary: Negative for dysuria, frequency and hematuria.  Musculoskeletal: Negative for back pain and arthralgias.  Skin: Positive for itching and rash.  Neurological: Negative for dizziness, seizures and speech difficulty.  Psychiatric/Behavioral: Negative for hallucinations and confusion.    Allergies  Review of patient's allergies indicates no known allergies.  Home Medications  No current outpatient prescriptions on file.  BP 147/94  Pulse 76  Temp 98.4 F (36.9 C)  Resp 18  Ht 5\' 6"   (1.676 m)  Wt 175 lb (79.379 kg)  BMI 28.25 kg/m2  SpO2 97%  Physical Exam  Nursing note and vitals reviewed. Constitutional: He is oriented to person, place, and time. He appears well-developed and well-nourished.  Non-toxic appearance.  HENT:  Head: Normocephalic.  Right Ear: Tympanic membrane and external ear normal.  Left Ear: Tympanic membrane and external ear normal.  Eyes: EOM and lids are normal. Pupils are equal, round, and reactive to light.  Neck: Normal range of motion. Neck supple. Carotid bruit is not present.  Cardiovascular: Normal rate, regular rhythm, normal heart sounds, intact distal pulses and normal pulses.   Pulmonary/Chest: Breath sounds normal. No respiratory distress.  Abdominal: Soft. Bowel sounds are normal. There is no tenderness. There is no guarding.  Musculoskeletal: Normal range of motion.  Lymphadenopathy:       Head (right side): No submandibular adenopathy present.       Head (left side): No submandibular adenopathy present.    He has no cervical adenopathy.  Neurological: He is alert and oriented to person, place, and time. He has normal strength. No cranial nerve deficit or sensory deficit.  Skin: Skin is warm and dry. Rash noted.       Pt has a dry scaling rash of the neck, arms and upper back.   Psychiatric: He has a normal mood and affect. His speech is normal.    ED Course  Procedures (including critical care time)  Labs Reviewed - No data to display No results found.  No diagnosis found.    MDM  I have reviewed nursing notes, vital signs, and all appropriate lab and imaging results for this patient. Pt has hx of eczema. It usually gets worse during the summer in the heat. Rx for dexamethasone and vistaril given to the patient.       Kathie Dike, [place] 12/02/11 [phone]

## 2012-01-18 ENCOUNTER — Emergency Department (HOSPITAL_COMMUNITY)
Admission: EM | Admit: 2012-01-18 | Discharge: 2012-01-18 | Disposition: A | Discharge status: Home / Self Care | Payer: Self-pay | Attending: Emergency Medicine | Admitting: Emergency Medicine

## 2012-01-18 ENCOUNTER — Encounter (HOSPITAL_COMMUNITY): Payer: Self-pay | Admitting: Emergency Medicine

## 2012-01-18 DIAGNOSIS — L309 Dermatitis, unspecified: Secondary | ICD-10-CM

## 2012-01-18 DIAGNOSIS — F17200 Nicotine dependence, unspecified, uncomplicated: Secondary | ICD-10-CM | POA: Insufficient documentation

## 2012-01-18 DIAGNOSIS — L259 Unspecified contact dermatitis, unspecified cause: Secondary | ICD-10-CM | POA: Insufficient documentation

## 2012-01-18 MED ORDER — PREDNISONE 20 MG PO TABS: 40.0000 mg | ORAL_TABLET | Freq: Every day | ORAL | Status: AC

## 2012-01-18 MED ORDER — PREDNISONE 20 MG PO TABS
40.0000 mg | ORAL_TABLET | Freq: Once | ORAL | Status: AC
  Administered 2012-01-18: 40 mg via ORAL
  Filled 2012-01-18: qty 2

## 2012-01-18 MED ORDER — HYDROXYZINE HCL 25 MG PO TABS: 25.0000 mg | ORAL_TABLET | Freq: Four times a day (QID) | ORAL | Status: AC

## 2012-01-18 NOTE — ED Notes (Signed)
Patient c/o eczema exacerbation; states has not been able to sleep due to itching.

## 2012-01-18 NOTE — ED Provider Notes (Signed)
History     CSN: [id number]  Arrival date & time 01/18/12  1478   First MD Initiated Contact with Patient 01/18/12 [phone]      Chief Complaint  Patient presents with  . Eczema    (Consider location/radiation/quality/duration/timing/severity/associated sxs/prior treatment) HPI Comments: 50 male with a history of eczema presents with a complaint of rash over his upper extremities, lower strumming his trunk and face. This is a dry scaling rash which is consistent with his prior eczema flares and has gotten worse over the last 2 days. He denies any pustules, petechiae or purpura and has no lesions in his mouth. He denies fevers chills nausea or vomiting. He has not had a medication recently for this. It is gradually getting worse  The history is provided by the patient and medical records.    Past Medical History  Diagnosis Date  . Eczema     History reviewed. No pertinent past surgical history.  No family history on file.  History  Substance Use Topics  . Smoking status: Current Everyday Smoker    Types: Cigarettes  . Smokeless tobacco: Not on file  . Alcohol Use: Yes     occ      Review of Systems  Constitutional: Negative for fever and chills.  Skin: Positive for rash.    Allergies  Review of patient's allergies indicates no known allergies.  Home Medications   Current Outpatient Rx  Name Route Sig Dispense Refill  . DEXAMETHASONE 4 MG PO TABS  1 po daily with food 6 tablet 1  . HYDROXYZINE HCL 25 MG PO TABS Oral Take 1 tablet (25 mg total) by mouth every 6 (six) hours. 12 tablet 0  . HYDROXYZINE PAMOATE 25 MG PO CAPS  1 po q6h prn itching. 20 capsule 0  . PREDNISONE 20 MG PO TABS Oral Take 2 tablets (40 mg total) by mouth daily. 10 tablet 0    BP 152/95  Pulse 72  Temp 98.3 F (36.8 C) (Oral)  Resp 18  Ht 5\' 6"  (1.676 m)  Wt 170 lb (77.111 kg)  BMI 27.44 kg/m2  SpO2 100%  Physical Exam  Nursing note and vitals reviewed. Constitutional: He appears  well-developed and well-nourished. No distress.  HENT:       Normocephalic, atraumatic, oropharynx clear without lesions, mucous membranes moist  Eyes: Conjunctivae are normal. No scleral icterus.  Pulmonary/Chest: Effort normal.  Neurological: He is alert. Coordination normal.       Normal gait, normal speech  Skin: Skin is warm and dry. Rash ( Scaling dry rash to the bilateral upper extremities, trunk, face) noted. He is not diaphoretic.       No petechiae or purpura.  Psychiatric: He has a normal mood and affect.    ED Course  Procedures (including critical care time)  Labs Reviewed - No data to display No results found.   1. Eczema       MDM  The patient has an eczema flare which is diffuse, will require oral steroids, Vistaril prescribed as well.        Vida Roller, MD 01/18/12 [phone]

## 2012-01-30 ENCOUNTER — Emergency Department (HOSPITAL_COMMUNITY)
Admission: EM | Admit: 2012-01-30 | Discharge: 2012-01-30 | Disposition: A | Discharge status: Home / Self Care | Payer: Self-pay | Attending: Emergency Medicine | Admitting: Emergency Medicine

## 2012-01-30 ENCOUNTER — Encounter (HOSPITAL_COMMUNITY): Payer: Self-pay | Admitting: Emergency Medicine

## 2012-01-30 DIAGNOSIS — N489 Disorder of penis, unspecified: Secondary | ICD-10-CM

## 2012-01-30 DIAGNOSIS — N4889 Other specified disorders of penis: Secondary | ICD-10-CM | POA: Insufficient documentation

## 2012-01-30 DIAGNOSIS — F17200 Nicotine dependence, unspecified, uncomplicated: Secondary | ICD-10-CM | POA: Insufficient documentation

## 2012-01-30 LAB — RPR: RPR Ser Ql: NONREACTIVE

## 2012-01-30 MED ORDER — PENICILLIN G BENZATHINE 1200000 UNIT/2ML IM SUSP
2.4000 10*6.[IU] | Freq: Once | INTRAMUSCULAR | Status: AC
  Administered 2012-01-30: 2.4 10*6.[IU] via INTRAMUSCULAR
  Filled 2012-01-30 (×2): qty 2

## 2012-01-30 NOTE — ED Notes (Signed)
Pt states he has a sore on his penis and has had unprotected sex.

## 2012-01-30 NOTE — ED Notes (Signed)
Pt refused to wait shot time.  Notified edp

## 2012-01-30 NOTE — ED Provider Notes (Signed)
History  This chart was scribed for Donnetta Hutching, MD by Shari Heritage. The patient was seen in room APA19/APA19. Patient's care was started at 0823.     CSN: [id number]  Arrival date & time 01/30/12  0734   First MD Initiated Contact with Patient 01/30/12 [phone]      Chief Complaint  Patient presents with  . Penis Pain    The history is provided by the patient. No language interpreter was used.    Tony Reynolds is a 41 y.o. male who presents to the Emergency Department complaining of a nontender ulcer to the glans of the distal penis onset a couple of days ago. Patient denies urethral discharge, fever, chills, or dysuria. Patient is sexually active and has had unprotected sex recently. Patient has a history of eczema and is a current everyday smoker.   Past Medical History  Diagnosis Date  . Eczema     History reviewed. No pertinent past surgical history.  History reviewed. No pertinent family history.  History  Substance Use Topics  . Smoking status: Current Every Day Smoker    Types: Cigarettes  . Smokeless tobacco: Not on file  . Alcohol Use: Yes     occ      Review of Systems A complete 10 system review of systems was obtained and all systems are negative except as noted in the HPI and PMH.   Allergies  Review of patient's allergies indicates no known allergies.  Home Medications   Current Outpatient Rx  Name Route Sig Dispense Refill  . DEXAMETHASONE 4 MG PO TABS  1 po daily with food 6 tablet 1  . HYDROXYZINE PAMOATE 25 MG PO CAPS  1 po q6h prn itching. 20 capsule 0    BP 153/104  Pulse 78  Temp 98.2 F (36.8 C) (Oral)  Resp 18  SpO2 99%  Physical Exam  Nursing note and vitals reviewed. Constitutional: He is oriented to person, place, and time. He appears well-developed and well-nourished.  HENT:  Head: Normocephalic and atraumatic.  Eyes: Conjunctivae normal and EOM are normal. Pupils are equal, round, and reactive to light.  Genitourinary:  Rectum normal and prostate normal. No penile tenderness. No discharge found.       Several 2-3 mm painless ulcerations on his distal glans. No urethral discharge.   Musculoskeletal: Normal range of motion.  Neurological: He is alert and oriented to person, place, and time.  Skin: Skin is warm and dry.  Psychiatric: He has a normal mood and affect.    ED Course  Procedures (including critical care time) DIAGNOSTIC STUDIES: Oxygen Saturation is 99% on room air, normal by my interpretation.    COORDINATION OF CARE: 8:35am- Patient informed of current plan for treatment and evaluation and agrees with plan at this time. Will order syphilis test, GC chlamydia and herpes culture. Patient's symptoms are consistent with syphilis, will treat with IM penicillin.  9:13am- Collected samples for culture tests.   Labs Reviewed - No data to display  No results found.   No diagnosis found.    MDM  Painless ulceration on the glans of penis suggestive. Rx IM Bicillin LA 2.4 million units. Cultures for herpes, gonorrhea, Chlamydia;  RPR blood work      I personally performed the services described in this documentation, which was scribed in my presence. The recorded information has been reviewed and considered.     Donnetta Hutching, MD 01/30/12 [phone]

## 2012-01-31 LAB — GC/CHLAMYDIA PROBE AMP, GENITAL: Chlamydia, DNA Probe: NEGATIVE

## 2012-02-02 LAB — HERPES SIMPLEX VIRUS CULTURE

## 2012-02-06 ENCOUNTER — Telehealth (HOSPITAL_COMMUNITY): Payer: Self-pay | Admitting: *Deleted

## 2012-02-06 NOTE — ED Notes (Signed)
Patient informed of positive results after id'd x 2 and informed of need to notify partner to be treated. Patient requests that rx be called to [facility] pharmacy [phone]

## 2012-02-06 NOTE — ED Notes (Signed)
+   Herpes  Order written for Acyclovir 400 mg po TID x 7 days . All sexual partners and future sexual partners should be notified,tested, and treated per Trixie Dredge.

## 2012-02-07 NOTE — ED Notes (Signed)
rx called to pharmacy by Kaye [facility]. 

## 2012-04-11 ENCOUNTER — Emergency Department (HOSPITAL_COMMUNITY)
Admission: EM | Admit: 2012-04-11 | Discharge: 2012-04-11 | Disposition: A | Discharge status: Home / Self Care | Payer: Self-pay | Attending: Emergency Medicine | Admitting: Emergency Medicine

## 2012-04-11 ENCOUNTER — Encounter (HOSPITAL_COMMUNITY): Payer: Self-pay | Admitting: *Deleted

## 2012-04-11 DIAGNOSIS — F17200 Nicotine dependence, unspecified, uncomplicated: Secondary | ICD-10-CM | POA: Insufficient documentation

## 2012-04-11 DIAGNOSIS — L259 Unspecified contact dermatitis, unspecified cause: Secondary | ICD-10-CM | POA: Insufficient documentation

## 2012-04-11 DIAGNOSIS — L309 Dermatitis, unspecified: Secondary | ICD-10-CM

## 2012-04-11 MED ORDER — HYDROXYZINE HCL 25 MG PO TABS
50.0000 mg | ORAL_TABLET | Freq: Once | ORAL | Status: AC
  Administered 2012-04-11: 50 mg via ORAL
  Filled 2012-04-11: qty 2

## 2012-04-11 MED ORDER — HYDROXYZINE HCL 50 MG PO TABS: ORAL_TABLET | ORAL | Status: DC

## 2012-04-11 MED ORDER — PREDNISONE 20 MG PO TABS: ORAL_TABLET | ORAL | Status: DC

## 2012-04-11 MED ORDER — PREDNISONE 50 MG PO TABS
60.0000 mg | ORAL_TABLET | Freq: Once | ORAL | Status: AC
  Administered 2012-04-11: 60 mg via ORAL
  Filled 2012-04-11: qty 1

## 2012-04-11 NOTE — ED Notes (Signed)
eczema to bil hands, arms, legs and back x 1 wk.

## 2012-04-11 NOTE — ED Provider Notes (Signed)
History     CSN: [id number]  Arrival date & time 04/11/12  1140   First MD Initiated Contact with Patient 04/11/12 1249      Chief Complaint  Patient presents with  . Rash    (Consider location/radiation/quality/duration/timing/severity/associated sxs/prior treatment) Patient is a 40 y.o. male presenting with rash. The history is provided by the patient.  Rash  This is a recurrent problem. The current episode started more than 2 days ago. The problem has been gradually worsening. Associated with: eczema flare up. There has been no fever. The rash is present on the right hand, left hand, right wrist, left arm and right arm (Elbow creases and bilateral posterior knees). The pain is at a severity of 0/10. The patient is experiencing no pain. Associated symptoms include itching. Pertinent negatives include no blisters and no weeping. He has tried antihistamines for the symptoms. The treatment provided no relief.    Past Medical History  Diagnosis Date  . Eczema     History reviewed. No pertinent past surgical history.  No family history on file.  History  Substance Use Topics  . Smoking status: Current Every Day Smoker    Types: Cigarettes  . Smokeless tobacco: Not on file  . Alcohol Use: Yes     Comment: occ      Review of Systems  Constitutional: Negative for fever and chills.  HENT: Negative for facial swelling.   Respiratory: Negative for shortness of breath and wheezing.   Skin: Positive for itching and rash.  Neurological: Negative for numbness.    Allergies  Review of patient's allergies indicates no known allergies.  Home Medications   Current Outpatient Rx  Name  Route  Sig  Dispense  Refill  . GOODYS BODY PAIN PO   Oral   Take 1 Package by mouth 2 (two) times daily as needed. For leg pain         . HYDROXYZINE HCL 50 MG PO TABS      Take one or two tablets every 6 hours as needed for itching   30 tablet   0   . PREDNISONE 20 MG PO TABS     Take 6 tabs daily by mouth for 1 day,  Then 5 tabs daily for 2 days,  4 tabs daily for 2 days,  3 tabs daily for 2 days,  2 tabs daily for 2 days,  Then 1 tab daily for 2 days.   36 tablet   0     BP 164/102  Pulse 83  Temp 98.2 F (36.8 C) (Oral)  Resp 16  Ht 5\' 6"  (1.676 m)  Wt 175 lb (79.379 kg)  BMI 28.25 kg/m2  SpO2 100%  Physical Exam  Constitutional: He appears well-developed and well-nourished. No distress.  HENT:  Head: Normocephalic.  Neck: Neck supple.  Cardiovascular: Normal rate.   Pulmonary/Chest: Effort normal. He has no wheezes.  Musculoskeletal: Normal range of motion. He exhibits no edema.  Skin: Rash noted. Rash is macular. No erythema.       Slightly raised, plaque-like patches of dry,  Scaly,  Thickened skin with excoriations,  Worst on palmar hands,  In finger webs,  Volar wrists,  Elbows and posterior knees,  Finer less obvious sandpaper quality rash on neck and upper back.    ED Course  Procedures (including critical care time)  Labs Reviewed - No data to display No results found.   1. Eczema       MDM  Prescribed  prednisone,  First dose given in ed.  Encouraged trial of atarax in place of benadryl for itching,  eucerin cream applied to damp skin.  Referral to Dr Margo Aye prn worsened or persistent sx.        Burgess Amor, [place] 04/11/12 2133

## 2012-04-12 NOTE — ED Provider Notes (Signed)
Medical screening examination/treatment/procedure(s) were performed by non-physician practitioner and as supervising physician I was immediately available for consultation/collaboration.   Mckayla Mulcahey L Kervin Bones, MD 04/12/12 1309 

## 2012-05-13 ENCOUNTER — Emergency Department (HOSPITAL_COMMUNITY)
Admission: EM | Admit: 2012-05-13 | Discharge: 2012-05-13 | Disposition: A | Discharge status: Home / Self Care | Payer: Self-pay | Attending: Emergency Medicine | Admitting: Emergency Medicine

## 2012-05-13 ENCOUNTER — Emergency Department (HOSPITAL_COMMUNITY): Payer: Self-pay

## 2012-05-13 ENCOUNTER — Encounter (HOSPITAL_COMMUNITY): Payer: Self-pay

## 2012-05-13 DIAGNOSIS — M5416 Radiculopathy, lumbar region: Secondary | ICD-10-CM

## 2012-05-13 DIAGNOSIS — F17200 Nicotine dependence, unspecified, uncomplicated: Secondary | ICD-10-CM | POA: Insufficient documentation

## 2012-05-13 DIAGNOSIS — IMO0002 Reserved for concepts with insufficient information to code with codable children: Secondary | ICD-10-CM | POA: Insufficient documentation

## 2012-05-13 MED ORDER — PREDNISONE 20 MG PO TABS: 60.0000 mg | ORAL_TABLET | Freq: Every day | ORAL | Status: DC

## 2012-05-13 MED ORDER — PREDNISONE 50 MG PO TABS
60.0000 mg | ORAL_TABLET | Freq: Once | ORAL | Status: AC
  Administered 2012-05-13: 60 mg via ORAL
  Filled 2012-05-13: qty 1

## 2012-05-13 MED ORDER — OXYCODONE-ACETAMINOPHEN 5-325 MG PO TABS: 1.0000 | ORAL_TABLET | ORAL | Status: AC | PRN

## 2012-05-13 MED ORDER — OXYCODONE-ACETAMINOPHEN 5-325 MG PO TABS
1.0000 | ORAL_TABLET | Freq: Once | ORAL | Status: AC
  Administered 2012-05-13: 1 via ORAL
  Filled 2012-05-13: qty 1

## 2012-05-13 NOTE — ED Notes (Signed)
C/o back pain that radiates down leg. Denies any injury. Ambulated without difficulty to triage

## 2012-05-13 NOTE — ED Provider Notes (Signed)
History     CSN: [id number]  Arrival date & time 05/13/12  1310   First MD Initiated Contact with Patient 05/13/12 1541      Chief Complaint  Patient presents with  . Back Pain    (Consider location/radiation/quality/duration/timing/severity/associated sxs/prior treatment) HPI Comments: Tony Reynolds  presents with acute on chronic low back pain which has which has been present for the past several months,  Worse the past few days.   Patient denies any new injury specifically.  There is constant aching  radiation into his right leg to the mid calf level.  There has been no weakness or numbness in the lower extremities and no urinary or bowel retention or incontinence.  Patient does not have a history of cancer or IVDU.  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   The history is provided by the patient.    Past Medical History  Diagnosis Date  . Eczema     History reviewed. No pertinent past surgical history.  No family history on file.  History  Substance Use Topics  . Smoking status: Current Every Day Smoker    Types: Cigarettes  . Smokeless tobacco: Not on file  . Alcohol Use: Yes     Comment: occ      Review of Systems  Constitutional: Negative for fever.  Respiratory: Negative for shortness of breath.   Cardiovascular: Negative for chest pain and leg swelling.  Gastrointestinal: Negative for abdominal pain, constipation and abdominal distention.  Genitourinary: Negative for dysuria, urgency, frequency, flank pain and difficulty urinating.  Musculoskeletal: Positive for back pain. Negative for joint swelling and gait problem.  Skin: Negative for rash.  Neurological: Negative for weakness and numbness.    Allergies  Review of patient's allergies indicates no known allergies.  Home Medications   Current Outpatient Rx  Name  Route  Sig  Dispense  Refill  . GOODYS BODY PAIN PO   Oral   Take 1 Package by mouth 2 (two) times  daily as needed. For leg pain         . HYDROXYZINE HCL 50 MG PO TABS      Take one or two tablets every 6 hours as needed for itching   30 tablet   0   . OXYCODONE-ACETAMINOPHEN 5-325 MG PO TABS   Oral   Take 1 tablet by mouth every 4 (four) hours as needed for pain.   20 tablet   0   . PREDNISONE 20 MG PO TABS      Take 6 tabs daily by mouth for 1 day,  Then 5 tabs daily for 2 days,  4 tabs daily for 2 days,  3 tabs daily for 2 days,  2 tabs daily for 2 days,  Then 1 tab daily for 2 days.   36 tablet   0   . PREDNISONE 20 MG PO TABS   Oral   Take 3 tablets (60 mg total) by mouth daily.   15 tablet   0     BP 174/97  Pulse 66  Temp 97.9 F (36.6 C)  Resp 16  Ht 5\' 6"  (1.676 m)  Wt 175 lb (79.379 kg)  BMI 28.25 kg/m2  SpO2 100%  Physical Exam  Nursing note and vitals reviewed. Constitutional: He appears well-developed and well-nourished.  HENT:  Head: Normocephalic.  Eyes: Conjunctivae normal are normal.  Neck: Normal range of motion. Neck supple.  Cardiovascular: Normal rate and intact distal  pulses.        Pedal pulses normal.  Pulmonary/Chest: Effort normal.  Abdominal: Soft. Bowel sounds are normal. He exhibits no distension and no mass.  Musculoskeletal: Normal range of motion. He exhibits no edema.       Lumbar back: He exhibits tenderness. He exhibits no swelling, no edema, no spasm and normal pulse.       Right paralumbar ttp with no midline pain and no SI joint pain.    Neurological: He is alert. He has normal strength. He displays no atrophy and no tremor. No sensory deficit. Gait normal.  Reflex Scores:      Patellar reflexes are 2+ on the right side and 2+ on the left side.      Achilles reflexes are 2+ on the right side and 2+ on the left side.      No strength deficit noted in hip and knee flexor and extensor muscle groups.  Ankle flexion and extension intact.  Skin: Skin is warm and dry.  Psychiatric: He has a normal mood and affect.    ED  Course  Procedures (including critical care time)  Labs Reviewed - No data to display No results found.   1. Lumbar radiculopathy, chronic    Pt given prednisone,  Oxycodone tablet in ed   MDM  Patients labs and/or radiological studies were reviewed during the medical decision making and disposition process. Patient with chronic low back pain with radiculopathy.  No neuro deficit on exam or by history to suggest emergent or surgical presentation.  Also discussed worsened sx that should prompt immediate re-evaluation including distal weakness, bowel/bladder retention/incontinence.   Prescribed prednisone 60 mg 5 day pulse dose,  Oxycodone.  Referral to Dr. Romeo Apple prn.             Burgess Amor, [place] 05/13/12 1724

## 2012-05-13 NOTE — Discharge Instructions (Signed)
Lumbosacral Radiculopathy Lumbosacral radiculopathy is a pinched nerve or nerves in the low back (lumbosacral area). When this happens you may have weakness in your legs and may not be able to stand on your toes. You may have pain going down into your legs. There may be difficulties with walking normally. There are many causes of this problem. Sometimes this may happen from an injury, or simply from arthritis or boney problems. It may also be caused by other illnesses such as diabetes. If there is no improvement after treatment, further studies may be done to find the exact cause. DIAGNOSIS  X-rays may be needed if the problems become long standing. Electromyograms may be done. This study is one in which the working of nerves and muscles is studied. HOME CARE INSTRUCTIONS   Applications of ice packs may be helpful. Ice can be used in a plastic bag with a towel around it to prevent frostbite to skin. This may be used every 2 hours for 20 to 30 minutes, or as needed, while awake, or as directed by your caregiver.  Only take over-the-counter or prescription medicines for pain, discomfort, or fever as directed by your caregiver.  If physical therapy was prescribed, follow your caregiver's directions. SEEK IMMEDIATE MEDICAL CARE IF:   You have pain not controlled with medications.  You seem to be getting worse rather than better.  You develop increasing weakness in your legs.  You develop loss of bowel or bladder control.  You have difficulty with walking or balance, or develop clumsiness in the use of your legs.  You have a fever. MAKE SURE YOU:   Understand these instructions.  Will watch your condition.  Will get help right away if you are not doing well or get worse. Document Released: 05/05/2005 Document Revised: 07/28/2011 Document Reviewed: 12/24/2007 [facility] Patient Information 2013 [place], [place].   Take your next dose of prednisone tomorrow morning.  Use the the other  medicine as directed.  Do not drive within 4 hours of taking oxycodone as this will make you drowsy.  Avoid lifting,  Bending,  Twisting or any other activity that worsens your pain over the next week.  Apply a heating pad to your lower back 20 minutes 3-4 times daily.  You should get rechecked if your symptoms are not better over the next 5 days,  Or you develop increased pain,  Weakness in your leg(s) or loss of bladder or bowel function - these are symptoms of a worse injury.

## 2012-05-24 NOTE — ED Provider Notes (Signed)
Medical screening examination/treatment/procedure(s) were performed by non-physician practitioner and as supervising physician I was immediately available for consultation/collaboration.   Reyes Aldaco M Itai Barbian, DO 05/24/12 1510 

## 2012-06-29 ENCOUNTER — Other Ambulatory Visit (HOSPITAL_COMMUNITY): Payer: Self-pay | Admitting: Physical Medicine and Rehabilitation

## 2012-06-29 DIAGNOSIS — M5126 Other intervertebral disc displacement, lumbar region: Secondary | ICD-10-CM

## 2012-07-01 ENCOUNTER — Ambulatory Visit (HOSPITAL_COMMUNITY): Payer: 59

## 2012-07-06 ENCOUNTER — Ambulatory Visit (HOSPITAL_COMMUNITY)
Admission: RE | Admit: 2012-07-06 | Discharge: 2012-07-06 | Disposition: A | Discharge status: Home / Self Care | Payer: 59 | Source: Ambulatory Visit | Attending: Physical Medicine and Rehabilitation | Admitting: Physical Medicine and Rehabilitation

## 2012-07-06 DIAGNOSIS — M5126 Other intervertebral disc displacement, lumbar region: Secondary | ICD-10-CM | POA: Insufficient documentation

## 2012-07-06 DIAGNOSIS — M545 Low back pain, unspecified: Secondary | ICD-10-CM | POA: Insufficient documentation

## 2012-07-06 DIAGNOSIS — R209 Unspecified disturbances of skin sensation: Secondary | ICD-10-CM | POA: Insufficient documentation

## 2012-12-06 ENCOUNTER — Emergency Department (HOSPITAL_COMMUNITY)
Admission: EM | Admit: 2012-12-06 | Discharge: 2012-12-06 | Disposition: A | Discharge status: Home / Self Care | Payer: 59 | Attending: Emergency Medicine | Admitting: Emergency Medicine

## 2012-12-06 ENCOUNTER — Encounter (HOSPITAL_COMMUNITY): Payer: Self-pay

## 2012-12-06 DIAGNOSIS — F17200 Nicotine dependence, unspecified, uncomplicated: Secondary | ICD-10-CM | POA: Insufficient documentation

## 2012-12-06 DIAGNOSIS — I10 Essential (primary) hypertension: Secondary | ICD-10-CM | POA: Insufficient documentation

## 2012-12-06 DIAGNOSIS — L259 Unspecified contact dermatitis, unspecified cause: Secondary | ICD-10-CM | POA: Insufficient documentation

## 2012-12-06 DIAGNOSIS — L309 Dermatitis, unspecified: Secondary | ICD-10-CM

## 2012-12-06 MED ORDER — HYDROCORTISONE 1 % EX CREA: TOPICAL_CREAM | Freq: Two times a day (BID) | CUTANEOUS | Status: DC

## 2012-12-06 NOTE — ED Notes (Signed)
Pt has hx eczema. Past 4 days symptoms have became worse with areas of hands that are cracking open.others areas to arms and back as well.  Pt works as a Market researcher.

## 2012-12-06 NOTE — ED Provider Notes (Signed)
History    This chart was scribed for Tony Roller, MD, by Yevette Edwards, ED Scribe. This patient was seen in room APA12/APA12 and the patient's care was started at 8:12 AM  CSN: [id number] Arrival date & time 12/06/12  4098  First MD Initiated Contact with Patient 12/06/12 [phone]     Chief Complaint  Patient presents with  . Eczema    The history is provided by the patient. No language interpreter was used.   HPI Comments: Tony Reynolds is a 42 y.o. male who presents to the Emergency Department complaining of a h/o of eczema which began 16 years ago but has recently increased in the past month. He states that he has experienced eczema to his arms, hands, back, torso, and legs. He denies experiencing any vision problems, headaches, cough, emesis, or diarrhea. He also denies a h/o of any other known medical problems. The pt states that he has used [employer], but with little resolution.  Past Medical History  Diagnosis Date  . Eczema    History reviewed. No pertinent past surgical history. No family history on file. History  Substance Use Topics  . Smoking status: Current Every Day Smoker    Types: Cigarettes  . Smokeless tobacco: Not on file  . Alcohol Use: Yes     Comment: occ    Review of Systems A complete 10 system review of systems was obtained, and all systems were negative except where indicated in the HPI and PE.   Allergies  Review of patient's allergies indicates no known allergies.  Home Medications   Current Outpatient Rx  Name  Route  Sig  Dispense  Refill  . Aspirin-Acetaminophen (GOODYS BODY PAIN PO)   Oral   Take 1 Package by mouth 2 (two) times daily as needed. For leg pain         . hydrocortisone cream 1 %   Topical   Apply topically 2 (two) times daily.   30 g   2   . hydrOXYzine (ATARAX/VISTARIL) 50 MG tablet      Take one or two tablets every 6 hours as needed for itching   30 tablet   0   . predniSONE (DELTASONE) 20 MG tablet       Take 6 tabs daily by mouth for 1 day,  Then 5 tabs daily for 2 days,  4 tabs daily for 2 days,  3 tabs daily for 2 days,  2 tabs daily for 2 days,  Then 1 tab daily for 2 days.   36 tablet   0   . predniSONE (DELTASONE) 20 MG tablet   Oral   Take 3 tablets (60 mg total) by mouth daily.   15 tablet   0    Triage Vitals: BP 164/113  Pulse 71  Temp(Src) 98.7 F (37.1 C) (Oral)  Resp 18  Ht 5\' 6"  (1.676 m)  Wt 175 lb (79.379 kg)  BMI 28.26 kg/m2  SpO2 100%  Physical Exam  Nursing note and vitals reviewed. Constitutional: He appears well-developed and well-nourished. No distress.  HENT:  Head: Normocephalic and atraumatic.  Mouth/Throat: Oropharynx is clear and moist. No oropharyngeal exudate.  No lesions.   Eyes: Conjunctivae and EOM are normal. Pupils are equal, round, and reactive to light. Right eye exhibits no discharge. Left eye exhibits no discharge. No scleral icterus.  Neck: Normal range of motion. Neck supple. No JVD present. No thyromegaly present.  Cardiovascular: Normal rate, regular rhythm, normal heart  sounds and intact distal pulses.  Exam reveals no gallop and no friction rub.   No murmur heard. Pulmonary/Chest: Effort normal. No respiratory distress. He has no wheezes.  Musculoskeletal: Normal range of motion.  Lymphadenopathy:    He has no cervical adenopathy.  Neurological: He is alert. Coordination normal.  Skin: Skin is warm and dry. No erythema.  Dry and scaling lesions to the bilateral hands with some cracking, no petechiae, no purpura, no vesicles or pustules.  Non tender.  Has some itching with these.  Also scattered over UE's, mild on the trunk and mild on the LE's.   Psychiatric: He has a normal mood and affect. His behavior is normal.    ED Course  Procedures (including critical care time)  DIAGNOSTIC STUDIES: Oxygen Saturation is 100% on room air, normal by my interpretation.    COORDINATION OF CARE:  8:14 AM-Discussed treatment plan with  patient, and the patient agreed to the plan.   Labs Reviewed - No data to display No results found. 1. Eczema   2. Hypertension     MDM  The pt is well apearing, he has been counceled about his BP and need for expedited f/u for recheck.  Start a steroid topical, stable for d/c.  Meds given in ED:  Medications - No data to display  New Prescriptions   HYDROCORTISONE CREAM 1 %    Apply topically 2 (two) times daily.      I personally performed the services described in this documentation, which was scribed in my presence. The recorded information has been reviewed and is accurate.      Tony Roller, MD 12/06/12 0830

## 2012-12-28 ENCOUNTER — Encounter (HOSPITAL_COMMUNITY): Payer: Self-pay | Admitting: *Deleted

## 2012-12-28 ENCOUNTER — Emergency Department (HOSPITAL_COMMUNITY)
Admission: EM | Admit: 2012-12-28 | Discharge: 2012-12-28 | Disposition: A | Discharge status: Home / Self Care | Payer: 59 | Attending: Emergency Medicine | Admitting: Emergency Medicine

## 2012-12-28 DIAGNOSIS — F17200 Nicotine dependence, unspecified, uncomplicated: Secondary | ICD-10-CM | POA: Insufficient documentation

## 2012-12-28 DIAGNOSIS — L259 Unspecified contact dermatitis, unspecified cause: Secondary | ICD-10-CM | POA: Insufficient documentation

## 2012-12-28 DIAGNOSIS — L309 Dermatitis, unspecified: Secondary | ICD-10-CM

## 2012-12-28 MED ORDER — TRIAMCINOLONE ACETONIDE 0.1 % EX CREA
TOPICAL_CREAM | Freq: Two times a day (BID) | CUTANEOUS | Status: DC
Start: 2012-12-28 — End: 2013-06-22

## 2012-12-28 MED ORDER — PREDNISONE 10 MG PO TABS: ORAL_TABLET | ORAL | Status: DC

## 2012-12-28 MED ORDER — HYDROXYZINE HCL 25 MG PO TABS: 25.0000 mg | ORAL_TABLET | Freq: Four times a day (QID) | ORAL | Status: DC

## 2012-12-28 NOTE — ED Notes (Signed)
Pt with eczema to right hand, hx of same, states the cream his doctor has given him is not working

## 2012-12-28 NOTE — ED Provider Notes (Signed)
CSN: [id number]     Arrival date & time 12/28/12  1537 History     First MD Initiated Contact with Patient 12/28/12 1631     Chief Complaint  Patient presents with  . Skin Problem   (Consider location/radiation/quality/duration/timing/severity/associated sxs/prior Treatment) Patient is a 42 y.o. male presenting with rash. The history is provided by the patient.  Rash Location:  Full body Quality: dryness, itchiness, peeling and scaling   Severity:  Severe Onset quality:  Gradual Duration:  8 weeks Timing:  Constant Progression:  Worsening Chronicity:  Chronic Relieved by:  Antihistamines, topical steroids and moisturizers Worsened by:  Nothing tried Ineffective treatments:  Anti-itch cream Associated symptoms: no abdominal pain, no fever, no joint pain, no shortness of breath and not wheezing     Past Medical History  Diagnosis Date  . Eczema    History reviewed. No pertinent past surgical history. History reviewed. No pertinent family history. History  Substance Use Topics  . Smoking status: Current Every Day Smoker    Types: Cigarettes  . Smokeless tobacco: Not on file  . Alcohol Use: Yes     Comment: occ    Review of Systems  Constitutional: Negative for fever and activity change.       All ROS Neg except as noted in HPI  HENT: Negative for nosebleeds and neck pain.   Eyes: Negative for photophobia and discharge.  Respiratory: Negative for cough, shortness of breath and wheezing.   Cardiovascular: Negative for chest pain and palpitations.  Gastrointestinal: Negative for abdominal pain and blood in stool.  Genitourinary: Negative for dysuria, frequency and hematuria.  Musculoskeletal: Negative for back pain and arthralgias.  Skin: Positive for rash.  Neurological: Negative for dizziness, seizures and speech difficulty.  Psychiatric/Behavioral: Negative for hallucinations and confusion.    Allergies  Tomato  Home Medications   Current Outpatient Rx   Name  Route  Sig  Dispense  Refill  . Aspirin-Acetaminophen (GOODYS BODY PAIN PO)   Oral   Take 1 Package by mouth 2 (two) times daily as needed. For leg pain         . hydrocortisone cream 1 %   Topical   Apply topically 2 (two) times daily.   30 g   2   . hydrOXYzine (ATARAX/VISTARIL) 50 MG tablet      Take one or two tablets every 6 hours as needed for itching   30 tablet   0   . predniSONE (DELTASONE) 20 MG tablet      Take 6 tabs daily by mouth for 1 day,  Then 5 tabs daily for 2 days,  4 tabs daily for 2 days,  3 tabs daily for 2 days,  2 tabs daily for 2 days,  Then 1 tab daily for 2 days.   36 tablet   0   . predniSONE (DELTASONE) 20 MG tablet   Oral   Take 3 tablets (60 mg total) by mouth daily.   15 tablet   0    BP 180/112  Pulse 84  Temp(Src) 98.9 F (37.2 C) (Oral)  Resp 18  Ht 5\' 6"  (1.676 m)  Wt 175 lb (79.379 kg)  BMI 28.26 kg/m2  SpO2 100% Physical Exam  Nursing note and vitals reviewed. Constitutional: He is oriented to person, place, and time. He appears well-developed and well-nourished.  Non-toxic appearance.  HENT:  Head: Normocephalic.  Right Ear: Tympanic membrane and external ear normal.  Left Ear: Tympanic membrane and external ear normal.  Eyes: EOM and lids are normal. Pupils are equal, round, and reactive to light.  Neck: Normal range of motion. Neck supple. Carotid bruit is not present.  Cardiovascular: Normal rate, regular rhythm, normal heart sounds, intact distal pulses and normal pulses.   Pulmonary/Chest: Breath sounds normal. No respiratory distress.  Abdominal: Soft. Bowel sounds are normal. There is no tenderness. There is no guarding.  Musculoskeletal: Normal range of motion.  Lymphadenopathy:       Head (right side): No submandibular adenopathy present.       Head (left side): No submandibular adenopathy present.    He has no cervical adenopathy.  Neurological: He is alert and oriented to person, place, and time. He  has normal strength. No cranial nerve deficit or sensory deficit.  Skin: Skin is warm and dry.  Dry scaling rash over most of the body. Hands, knees, and elbows, worse than other areas. No hot areas.   Psychiatric: He has a normal mood and affect. His speech is normal.    ED Course   Procedures (including critical care time)  Labs Reviewed - No data to display No results found. No diagnosis found.  MDM  **I have reviewed nursing notes, vital signs, and all appropriate lab and imaging results for this patient.* Pt has hx of eczema. States he is having a more difficult bout this week than usual. He has tried hydrocortisone 10 without relief. Rx for vistaril, prednisone taper, and triamcinolone given to the patient. Pt strongly encouraged to establish a primary MD for additional evaluation.  Kathie Dike, PA-C 12/28/12 1722

## 2012-12-29 NOTE — ED Provider Notes (Signed)
Medical screening examination/treatment/procedure(s) were performed by non-physician practitioner and as supervising physician I was immediately available for consultation/collaboration. Wake Conlee, MD, FACEP   Shruti Arrey L Avayah Raffety, MD 12/29/12 1150 

## 2013-02-26 ENCOUNTER — Encounter (HOSPITAL_COMMUNITY): Payer: Self-pay | Admitting: Emergency Medicine

## 2013-02-26 ENCOUNTER — Emergency Department (HOSPITAL_COMMUNITY)
Admission: EM | Admit: 2013-02-26 | Discharge: 2013-02-26 | Disposition: A | Discharge status: Home / Self Care | Payer: 59 | Attending: Emergency Medicine | Admitting: Emergency Medicine

## 2013-02-26 DIAGNOSIS — L259 Unspecified contact dermatitis, unspecified cause: Secondary | ICD-10-CM | POA: Insufficient documentation

## 2013-02-26 DIAGNOSIS — M79651 Pain in right thigh: Secondary | ICD-10-CM

## 2013-02-26 DIAGNOSIS — F17200 Nicotine dependence, unspecified, uncomplicated: Secondary | ICD-10-CM | POA: Insufficient documentation

## 2013-02-26 DIAGNOSIS — IMO0002 Reserved for concepts with insufficient information to code with codable children: Secondary | ICD-10-CM | POA: Insufficient documentation

## 2013-02-26 DIAGNOSIS — M25559 Pain in unspecified hip: Secondary | ICD-10-CM | POA: Insufficient documentation

## 2013-02-26 MED ORDER — IBUPROFEN 800 MG PO TABS: 800.0000 mg | ORAL_TABLET | Freq: Three times a day (TID) | ORAL | Status: DC

## 2013-02-26 MED ORDER — IBUPROFEN 800 MG PO TABS
800.0000 mg | ORAL_TABLET | Freq: Once | ORAL | Status: AC
  Administered 2013-02-26: 800 mg via ORAL
  Filled 2013-02-26: qty 1

## 2013-02-26 NOTE — ED Provider Notes (Signed)
CSN: [id number]     Arrival date & time 02/26/13  0810 History  This chart was scribed for Dagmar Hait, MD by Leone Payor, ED Scribe. This patient was seen in room APA09/APA09 and the patient's care was started 8:14 AM.    Chief Complaint  Patient presents with  . Inguinal Hernia    Patient is a 42 y.o. male presenting with musculoskeletal pain. The history is provided by the patient. No language interpreter was used.  Muscle Pain This is a chronic problem. The current episode started more than 1 week ago. The problem occurs constantly. The problem has not changed since onset.The symptoms are aggravated by walking. Nothing relieves the symptoms. He has tried nothing for the symptoms. The treatment provided no relief.    HPI Comments: Tony Reynolds is a 42 y.o. male who presents to the Emergency Department complaining of an ongoing, constant right upper thigh muscle pain that began 3-4 months ago. Pt states it does not hurt to touch. He states it hurts to walk. He states it does not change in size when standing or sitting. He denies dysuria, testicular pain, penile discharge, concern for STD.   Past Medical History  Diagnosis Date  . Eczema    History reviewed. No pertinent past surgical history. No family history on file. History  Substance Use Topics  . Smoking status: Current Every Day Smoker    Types: Cigarettes  . Smokeless tobacco: Not on file  . Alcohol Use: Yes     Comment: occ    Review of Systems  Genitourinary: Negative for dysuria, discharge and testicular pain.  Musculoskeletal: Positive for myalgias (right upper thigh).  All other systems reviewed and are negative.    Allergies  Tomato  Home Medications   Current Outpatient Rx  Name  Route  Sig  Dispense  Refill  . hydrOXYzine (ATARAX/VISTARIL) 25 MG tablet   Oral   Take 1 tablet (25 mg total) by mouth every 6 (six) hours.   20 tablet   0   . ibuprofen (ADVIL,MOTRIN) 800 MG tablet   Oral  Take 1 tablet (800 mg total) by mouth 3 (three) times daily.   21 tablet   0   . predniSONE (DELTASONE) 10 MG tablet      6,5,4,3,2,1 - take with food   21 tablet   0   . triamcinolone cream (KENALOG) 0.1 %   Topical   Apply topically 2 (two) times daily.   454 g   0    BP 161/104  Pulse 84  Temp(Src) 98.5 F (36.9 C) (Oral)  Resp 16  Ht 5\' 6"  (1.676 m)  Wt 175 lb (79.379 kg)  BMI 28.26 kg/m2  SpO2 96% Physical Exam  Nursing note and vitals reviewed. Constitutional: He is oriented to person, place, and time. He appears well-developed and well-nourished.  HENT:  Head: Normocephalic and atraumatic.  Eyes: Conjunctivae and EOM are normal. Pupils are equal, round, and reactive to light.  Neck: Normal range of motion. Neck supple.  Cardiovascular: Normal rate, regular rhythm and normal heart sounds.   Pulmonary/Chest: Effort normal and breath sounds normal.  Abdominal: Soft. Bowel sounds are normal. Hernia confirmed negative in the right inguinal area and confirmed negative in the left inguinal area.  Genitourinary: Right testis shows no mass and no tenderness. Left testis shows no mass and no tenderness. No penile tenderness. No discharge found.  Tender to the right medial thigh in the inguinal region.   Musculoskeletal:  Normal range of motion.  Lymphadenopathy:       Right: No inguinal adenopathy present.       Left: No inguinal adenopathy present.  Neurological: He is alert and oriented to person, place, and time.  Skin: Skin is warm and dry.  Psychiatric: He has a normal mood and affect.    ED Course  Procedures   DIAGNOSTIC STUDIES: Oxygen Saturation is 100% on RA, normal by my interpretation.    COORDINATION OF CARE: 8:14 AM Will prescribe anti-inflammatories. Discussed treatment plan with pt at bedside and pt agreed to plan.   Labs Review Labs Reviewed - No data to display Imaging Review No results found.  EKG Interpretation   None       MDM   1.  Thigh pain, musculoskeletal, right    42 year old male presents with eye pain. States it's been present for 3-4 months. Per nursing notes, he stated he had a right inguinal hernia. I hernia, he denied. Patient has his right thigh pain it just hurts. He denies any testicular pain, penile discharge, concern for STDs. Denies any genitourinary lesions. Hears vitals are stable. He has normal right lower extremity strength and sensation and reflexes. He has tenderness in the right proximal medial thigh just below the inguinal canal. He has no inguinal hernia. No testicular pain or penile pain. He has no inguinal lymphadenopathy. He does has tenderness to palpation of the muscles medially of his right proximal thigh. I will give NSAIDs for muscle pain and instructed PCP f/u.   I personally performed the services described in this documentation, which was scribed in my presence. The recorded information has been reviewed and is accurate.     Dagmar Hait, MD 02/26/13 [phone]

## 2013-02-26 NOTE — ED Notes (Signed)
Instructions, prescriptions, and f/u information given/reviewed - verbalizes understanding. A&ox4; in no apparent distress.  Ambulatory with steady gait.

## 2013-02-26 NOTE — ED Notes (Signed)
Pt states right inguinal hernia x 1 week.

## 2013-06-22 ENCOUNTER — Emergency Department (HOSPITAL_COMMUNITY)
Admission: EM | Admit: 2013-06-22 | Discharge: 2013-06-22 | Disposition: A | Discharge status: Home / Self Care | Payer: 59 | Attending: Emergency Medicine | Admitting: Emergency Medicine

## 2013-06-22 ENCOUNTER — Encounter (HOSPITAL_COMMUNITY): Payer: Self-pay | Admitting: Emergency Medicine

## 2013-06-22 DIAGNOSIS — L259 Unspecified contact dermatitis, unspecified cause: Secondary | ICD-10-CM

## 2013-06-22 DIAGNOSIS — L309 Dermatitis, unspecified: Secondary | ICD-10-CM

## 2013-06-22 DIAGNOSIS — F17200 Nicotine dependence, unspecified, uncomplicated: Secondary | ICD-10-CM | POA: Insufficient documentation

## 2013-06-22 DIAGNOSIS — I889 Nonspecific lymphadenitis, unspecified: Secondary | ICD-10-CM

## 2013-06-22 MED ORDER — CLOTRIMAZOLE-BETAMETHASONE 1-0.05 % EX CREA: TOPICAL_CREAM | CUTANEOUS | Status: DC

## 2013-06-22 MED ORDER — SULFAMETHOXAZOLE-TRIMETHOPRIM 800-160 MG PO TABS: 1.0000 | ORAL_TABLET | Freq: Two times a day (BID) | ORAL | Status: DC

## 2013-06-22 NOTE — ED Provider Notes (Addendum)
CSN: [id number]     Arrival date & time 06/22/13  [id number] History  This chart was scribed for Ward GivensIva L Sukaina Toothaker, MD by Leone PayorSonum Patel, ED Scribe. This patient was seen in room APA03/APA03 and the patient's care was started 11:56 AM.     Chief Complaint  Patient presents with  . Leg Pain    The history is provided by the patient. No language interpreter was used.    HPI Comments: Tony Reynolds is a 43 y.o. male with past medical history of eczema who presents to the Emergency Department who was initially a very difficult historian (states the rash started "a minute ago", or "a while back").  complaining of a flare up of eczema described as burning and dryness to the right knee that worsened 2 weeks ago. He reports the area worsened after wearing a right knee brace 2 weeks ago.  Pt reports being being seen by Dr. Margo AyeHall, dermatologist last year  and was prescribed a cream which he used with relief of all the excema but on his right knee. Pt states his eczema initially began 10 years ago and reports a paternal family history of it. He has not tried OTC creams for his symptoms. He also complains of an enlarged area to the right groin which he noticed a few months ago. He denies fever. He states his knee rash "is the same as it was last year when I saw Dr Margo AyeHall".   No PCP   Past Medical History  Diagnosis Date  . Eczema    History reviewed. No pertinent past surgical history. No family history on file. History  Substance Use Topics  . Smoking status: Current Every Day Smoker    Types: Cigarettes  . Smokeless tobacco: Not on file  . Alcohol Use: Yes     Comment: occ  employed Drinks 1-2 beers daily Smokes 1/2 ppd  Review of Systems  Constitutional: Negative for fever.  Genitourinary:       Pain and swelling to right groin  Skin: Positive for rash.  All other systems reviewed and are negative.    Allergies  Tomato  Home Medications   Current Outpatient Rx  Name  Route  Sig  Dispense  Refill   . ibuprofen (ADVIL,MOTRIN) 200 MG tablet   Oral   Take 600 mg by mouth every 6 (six) hours as needed for moderate pain.          BP 179/111  Pulse 78  Temp(Src) 98.7 F (37.1 C) (Oral)  Resp 20  Ht 5\' 6"  (1.676 m)  Wt 175 lb (79.379 kg)  BMI 28.26 kg/m2  SpO2 99%  Vital signs normal except hypertension   Physical Exam  Nursing note and vitals reviewed. Constitutional: He is oriented to person, place, and time. He appears well-developed and well-nourished.  Non-toxic appearance. He does not appear ill. No distress.  HENT:  Head: Normocephalic and atraumatic.  Right Ear: External ear normal.  Left Ear: External ear normal.  Nose: Nose normal. No mucosal edema or rhinorrhea.  Mouth/Throat: Oropharynx is clear and moist and mucous membranes are normal. No dental abscesses or uvula swelling.  Eyes: Conjunctivae and EOM are normal. Pupils are equal, round, and reactive to light.  Neck: Normal range of motion and full passive range of motion without pain. Neck supple.  Pulmonary/Chest: Effort normal. No respiratory distress. He has no rhonchi. He exhibits no crepitus.  Abdominal: Normal appearance.  Genitourinary:  Has tender swollen lymph node in femoral  triangle.   Musculoskeletal: Normal range of motion. He exhibits no edema and no tenderness.  Moves all extremities well.   Lymphadenopathy:  Pt has a tender right femoral lymph node  Neurological: He is alert and oriented to person, place, and time. He has normal strength. No cranial nerve deficit.  Skin: Skin is warm, dry and intact. No rash noted. No erythema. No pallor.  Well demarkated area of scaly skin and redness to right knee. See photo.   When asked if his knee brace was similar to the neoprene glove he was wearing on his hands he states "there isn't anything on my hands". Obviously there was.   Psychiatric: He has a normal mood and affect. His speech is normal and behavior is normal. His mood appears not anxious.          ED Course  Procedures (including critical care time)  DIAGNOSTIC STUDIES: Oxygen Saturation is 99% on RA, normal by my interpretation.    COORDINATION OF CARE: 11:56 AM Discussed treatment plan with pt at bedside and pt agreed to plan.   Labs Review Labs Reviewed - No data to display Imaging Review No results found.  EKG Interpretation   None       MDM  Pt with contact dermatitis vs eczema.    1. Eczema   2. Contact dermatitis   3. Lymphadenitis     Discharge Medication List as of 06/22/2013  1:14 PM    START taking these medications   Details  clotrimazole-betamethasone (LOTRISONE) cream Apply to affected area 2 times daily prn, Print    sulfamethoxazole-trimethoprim (SEPTRA DS) 800-160 MG per tablet Take 1 tablet by mouth every 12 (twelve) hours., Starting 06/22/2013, Until Discontinued, Print        Plan discharge   Devoria Albe, MD, FACEP   I personally performed the services described in this documentation, which was scribed in my presence. The recorded information has been reviewed and considered.  Devoria Albe, MD, FACEP   Ward Givens, MD 06/22/13 1555  Ward Givens, MD 06/22/13 1556

## 2013-06-22 NOTE — Discharge Instructions (Signed)
Stop wearing the knee brace if it is making your rash worse. Use the ointment on your rash, you can also use topical cortisone cream OTC mixed with lotrimin OTC cream. Take the antibiotic until gone in case you are trying to get an infection in the rash. You can take zyrtec OTC for itching. Have Dr Margo AyeHall recheck you if not improving in the next week.     Eczema Eczema, also called atopic dermatitis, is a skin disorder that causes inflammation of the skin. It causes a red rash and dry, scaly skin. The skin becomes very itchy. Eczema is generally worse during the cooler winter months and often improves with the warmth of summer. Eczema usually starts showing signs in infancy. Some children outgrow eczema, but it may last through adulthood.  CAUSES  The exact cause of eczema is not known, but it appears to run in families. People with eczema often have a family history of eczema, allergies, asthma, or hay fever. Eczema is not contagious. Flare-ups of the condition may be caused by:   Contact with something you are sensitive or allergic to.   Stress. SIGNS AND SYMPTOMS  Dry, scaly skin.   Red, itchy rash.   Itchiness. This may occur before the skin rash and may be very intense.  DIAGNOSIS  The diagnosis of eczema is usually made based on symptoms and medical history. TREATMENT  Eczema cannot be cured, but symptoms usually can be controlled with treatment and other strategies. A treatment plan might include:  Controlling the itching and scratching.   Use over-the-counter antihistamines as directed for itching. This is especially useful at night when the itching tends to be worse.   Use over-the-counter steroid creams as directed for itching.   Avoid scratching. Scratching makes the rash and itching worse. It may also result in a skin infection (impetigo) due to a break in the skin caused by scratching.   Keeping the skin well moisturized with creams every day. This will seal in  moisture and help prevent dryness. Lotions that contain alcohol and water should be avoided because they can dry the skin.   Limiting exposure to things that you are sensitive or allergic to (allergens).   Recognizing situations that cause stress.   Developing a plan to manage stress.  HOME CARE INSTRUCTIONS   Only take over-the-counter or prescription medicines as directed by your health care provider.   Do not use anything on the skin without checking with your health care provider.   Keep baths or showers short (5 minutes) in warm (not hot) water. Use mild cleansers for bathing. These should be unscented. You may add nonperfumed bath oil to the bath water. It is best to avoid soap and bubble bath.   Immediately after a bath or shower, when the skin is still damp, apply a moisturizing ointment to the entire body. This ointment should be a petroleum ointment. This will seal in moisture and help prevent dryness. The thicker the ointment, the better. These should be unscented.   Keep fingernails cut short. Children with eczema may need to wear soft gloves or mittens at night after applying an ointment.   Dress in clothes made of cotton or cotton blends. Dress lightly, because heat increases itching.   A child with eczema should stay away from anyone with fever blisters or cold sores. The virus that causes fever blisters (herpes simplex) can cause a serious skin infection in children with eczema. SEEK MEDICAL CARE IF:   Your itching  interferes with sleep.   Your rash gets worse or is not better within 1 week after starting treatment.   You see pus or soft yellow scabs in the rash area.   You have a fever.   You have a rash flare-up after contact with someone who has fever blisters.  Document Released: 05/02/2000 Document Revised: 02/23/2013 Document Reviewed: 12/06/2012 [facility] Patient Information 2014 [place], [place].

## 2013-06-22 NOTE — ED Notes (Signed)
Pt reports has pain and swelling in r groin for the past month.  Pt says came today because pain intensified after working last night.

## 2013-07-06 ENCOUNTER — Emergency Department (HOSPITAL_COMMUNITY)
Admission: EM | Admit: 2013-07-06 | Discharge: 2013-07-06 | Disposition: A | Discharge status: Home / Self Care | Payer: 59 | Attending: Emergency Medicine | Admitting: Emergency Medicine

## 2013-07-06 ENCOUNTER — Encounter (HOSPITAL_COMMUNITY): Payer: Self-pay | Admitting: Emergency Medicine

## 2013-07-06 DIAGNOSIS — Z79899 Other long term (current) drug therapy: Secondary | ICD-10-CM | POA: Insufficient documentation

## 2013-07-06 DIAGNOSIS — L259 Unspecified contact dermatitis, unspecified cause: Secondary | ICD-10-CM | POA: Insufficient documentation

## 2013-07-06 DIAGNOSIS — L309 Dermatitis, unspecified: Secondary | ICD-10-CM

## 2013-07-06 DIAGNOSIS — F17200 Nicotine dependence, unspecified, uncomplicated: Secondary | ICD-10-CM | POA: Insufficient documentation

## 2013-07-06 MED ORDER — METHYLPREDNISOLONE 4 MG PO KIT: PACK | ORAL | Status: DC

## 2013-07-06 MED ORDER — CETIRIZINE HCL 10 MG PO TABS: ORAL_TABLET | ORAL | Status: DC

## 2013-07-06 MED ORDER — HYDROXYZINE HCL 25 MG PO TABS
50.0000 mg | ORAL_TABLET | Freq: Once | ORAL | Status: AC
  Administered 2013-07-06: 50 mg via ORAL
  Filled 2013-07-06: qty 2

## 2013-07-06 MED ORDER — HYDROXYZINE HCL 50 MG/ML IM SOLN: 50.0000 mg | Freq: Once | INTRAMUSCULAR | Status: DC

## 2013-07-06 NOTE — ED Notes (Signed)
Patient c/o eczema itching; states having a hard time sleeping due to itching.

## 2013-07-06 NOTE — ED Notes (Signed)
Discharge instructions given and reviewed with patient.  Prescriptions given for Medrol dose pack and Zyrtec; effects and use explained.  Patient verbalized understanding to take medications as directed.  Patient ambulatory; discharged home in good condition.

## 2013-07-06 NOTE — ED Provider Notes (Signed)
CSN: [id number]     Arrival date & time 07/06/13  0035 History   First MD Initiated Contact with Patient 07/06/13 [phone]     Chief Complaint  Patient presents with  . Eczema     (Consider location/radiation/quality/duration/timing/severity/associated sxs/prior Treatment) HPI This is a 43 year old male with a history of eczema. He is here with an eczema flare that began about 4 days ago. It acutely worsened over the past few hours. It is generalized and severely pruritic. He is unable to sleep due to the itching. He has not taken anything for the itching. He previously had some Lotrisone cream but is out. He denies any difficulty breathing or throat swelling. He is not aware of any trigger for this.  Past Medical History  Diagnosis Date  . Eczema    History reviewed. No pertinent past surgical history. No family history on file. History  Substance Use Topics  . Smoking status: Current Every Day Smoker    Types: Cigarettes  . Smokeless tobacco: Not on file  . Alcohol Use: Yes     Comment: occ    Review of Systems  All other systems reviewed and are negative.   Allergies  Tomato  Home Medications   Current Outpatient Rx  Name  Route  Sig  Dispense  Refill  . clotrimazole-betamethasone (LOTRISONE) cream      Apply to affected area 2 times daily prn   15 g   0   . ibuprofen (ADVIL,MOTRIN) 200 MG tablet   Oral   Take 600 mg by mouth every 6 (six) hours as needed for moderate pain.         Marland Kitchen sulfamethoxazole-trimethoprim (SEPTRA DS) 800-160 MG per tablet   Oral   Take 1 tablet by mouth every 12 (twelve) hours.   10 tablet   0    BP 172/103  Pulse 100  Temp(Src) 98.1 F (36.7 C) (Oral)  Resp 20  Ht 5\' 6"  (1.676 m)  Wt 165 lb (74.844 kg)  BMI 26.64 kg/m2  SpO2 100%  Physical Exam General: Well-developed, well-nourished male in no acute distress; appearance consistent with age of record HENT: normocephalic; atraumatic Eyes: pupils equal, round and reactive  to light; extraocular muscles intact; arcus senilis bilaterally Neck: supple Heart: regular rate and rhythm Lungs: clear to auscultation bilaterally Abdomen: soft; nondistended Extremities: No deformity; full range of motion Neurologic: Awake, alert and oriented; motor function intact in all extremities and symmetric; no facial droop Skin: Warm and dry; generalized eczematous rash Psychiatric: Normal mood and affect     ED Course  Procedures (including critical care time)  MDM      Hanley Seamen, MD 07/06/13 3748

## 2013-08-28 ENCOUNTER — Emergency Department (HOSPITAL_COMMUNITY): Payer: 59

## 2013-08-28 ENCOUNTER — Encounter (HOSPITAL_COMMUNITY): Payer: Self-pay | Admitting: Emergency Medicine

## 2013-08-28 ENCOUNTER — Emergency Department (HOSPITAL_COMMUNITY)
Admission: EM | Admit: 2013-08-28 | Discharge: 2013-08-28 | Disposition: A | Discharge status: Home / Self Care | Payer: 59 | Attending: Emergency Medicine | Admitting: Emergency Medicine

## 2013-08-28 DIAGNOSIS — I10 Essential (primary) hypertension: Secondary | ICD-10-CM

## 2013-08-28 DIAGNOSIS — M545 Low back pain, unspecified: Secondary | ICD-10-CM | POA: Insufficient documentation

## 2013-08-28 DIAGNOSIS — R21 Rash and other nonspecific skin eruption: Secondary | ICD-10-CM | POA: Insufficient documentation

## 2013-08-28 DIAGNOSIS — IMO0002 Reserved for concepts with insufficient information to code with codable children: Secondary | ICD-10-CM | POA: Insufficient documentation

## 2013-08-28 DIAGNOSIS — M549 Dorsalgia, unspecified: Secondary | ICD-10-CM

## 2013-08-28 DIAGNOSIS — F17200 Nicotine dependence, unspecified, uncomplicated: Secondary | ICD-10-CM | POA: Insufficient documentation

## 2013-08-28 DIAGNOSIS — L259 Unspecified contact dermatitis, unspecified cause: Secondary | ICD-10-CM | POA: Insufficient documentation

## 2013-08-28 MED ORDER — LISINOPRIL 20 MG PO TABS
20.0000 mg | ORAL_TABLET | Freq: Every day | ORAL | Status: DC
Start: 2013-08-28 — End: 2013-10-21

## 2013-08-28 MED ORDER — METHYLPREDNISOLONE SODIUM SUCC 125 MG IJ SOLR
125.0000 mg | Freq: Once | INTRAMUSCULAR | Status: AC
  Administered 2013-08-28: 125 mg via INTRAMUSCULAR
  Filled 2013-08-28: qty 2

## 2013-08-28 MED ORDER — METHYLPREDNISOLONE SODIUM SUCC 125 MG IJ SOLR: 125.0000 mg | Freq: Once | INTRAMUSCULAR | Status: DC

## 2013-08-28 MED ORDER — HYDROMORPHONE HCL PF 1 MG/ML IJ SOLN
1.0000 mg | Freq: Once | INTRAMUSCULAR | Status: AC
  Administered 2013-08-28: 1 mg via INTRAMUSCULAR
  Filled 2013-08-28: qty 1

## 2013-08-28 MED ORDER — ONDANSETRON 8 MG PO TBDP
8.0000 mg | ORAL_TABLET | Freq: Once | ORAL | Status: AC
  Administered 2013-08-28: 8 mg via ORAL
  Filled 2013-08-28: qty 1

## 2013-08-28 MED ORDER — PREDNISONE 10 MG PO TABS: 20.0000 mg | ORAL_TABLET | Freq: Every day | ORAL | Status: DC

## 2013-08-28 MED ORDER — ONDANSETRON HCL 4 MG/2ML IJ SOLN: 4.0000 mg | Freq: Once | INTRAMUSCULAR | Status: DC

## 2013-08-28 MED ORDER — HYDROMORPHONE HCL PF 1 MG/ML IJ SOLN: 1.0000 mg | Freq: Once | INTRAMUSCULAR | Status: DC

## 2013-08-28 MED ORDER — TRAMADOL HCL 50 MG PO TABS: 50.0000 mg | ORAL_TABLET | Freq: Four times a day (QID) | ORAL | Status: DC | PRN

## 2013-08-28 NOTE — ED Notes (Signed)
Patient c/o back pain x 2-3 years.  Patient c/o right leg pain due to back pain.

## 2013-08-28 NOTE — ED Provider Notes (Signed)
CSN: [id number]     Arrival date & time 08/28/13  0604 History   First MD Initiated Contact with Patient 08/28/13 [phone]     Chief Complaint  Patient presents with  . Back Pain     (Consider location/radiation/quality/duration/timing/severity/associated sxs/prior Treatment) Patient is a 43 y.o. male presenting with back pain. The history is provided by the patient (the pt complains of back pain).  Back Pain Location:  Lumbar spine Quality:  Aching Radiates to:  R thigh Pain severity:  Moderate Onset quality:  Gradual Timing:  Constant Progression:  Worsening Chronicity:  New Associated symptoms: no abdominal pain, no chest pain and no headaches     Past Medical History  Diagnosis Date  . Eczema    History reviewed. No pertinent past surgical history. No family history on file. History  Substance Use Topics  . Smoking status: Current Every Day Smoker    Types: Cigarettes  . Smokeless tobacco: Not on file  . Alcohol Use: Yes     Comment: occ    Review of Systems  Constitutional: Negative for appetite change and fatigue.  HENT: Negative for congestion, ear discharge and sinus pressure.   Eyes: Negative for discharge.  Respiratory: Negative for cough.   Cardiovascular: Negative for chest pain.  Gastrointestinal: Negative for abdominal pain and diarrhea.  Genitourinary: Negative for frequency and hematuria.  Musculoskeletal: Positive for back pain.  Skin: Positive for rash.  Neurological: Negative for seizures and headaches.  Psychiatric/Behavioral: Negative for hallucinations.      Allergies  Tomato  Home Medications   Current Outpatient Rx  Name  Route  Sig  Dispense  Refill  . cetirizine (ZYRTEC) 10 MG tablet      Take 1 tablet at bedtime as needed for itching. If needed may be taken every 12 hours.         Marland Kitchen. ibuprofen (ADVIL,MOTRIN) 200 MG tablet   Oral   Take 600 mg by mouth every 6 (six) hours as needed for moderate pain.         .  clotrimazole-betamethasone (LOTRISONE) cream      Apply to affected area 2 times daily prn   15 g   0   . lisinopril (PRINIVIL,ZESTRIL) 20 MG tablet   Oral   Take 1 tablet (20 mg total) by mouth daily.   30 tablet   0   . methylPREDNISolone (MEDROL DOSEPAK) 4 MG tablet      Taper dose per package instructions.   21 tablet   0   . predniSONE (DELTASONE) 10 MG tablet   Oral   Take 2 tablets (20 mg total) by mouth daily.   14 tablet   0   . sulfamethoxazole-trimethoprim (SEPTRA DS) 800-160 MG per tablet   Oral   Take 1 tablet by mouth every 12 (twelve) hours.   10 tablet   0   . traMADol (ULTRAM) 50 MG tablet   Oral   Take 1 tablet (50 mg total) by mouth every 6 (six) hours as needed.   30 tablet   0    BP 165/111  Pulse 78  Temp(Src) 98.4 F (36.9 C)  Resp 18  Ht 5\' 6"  (1.676 m)  Wt 165 lb (74.844 kg)  BMI 26.64 kg/m2  SpO2 99% Physical Exam  Constitutional: He is oriented to person, place, and time. He appears well-developed.  HENT:  Head: Normocephalic.  Eyes: Conjunctivae and EOM are normal. No scleral icterus.  Neck: Neck supple. No thyromegaly present.  Cardiovascular: Normal rate and regular rhythm.  Exam reveals no gallop and no friction rub.   No murmur heard. Pulmonary/Chest: No stridor. He has no wheezes. He has no rales. He exhibits no tenderness.  Abdominal: He exhibits no distension. There is no tenderness. There is no rebound.  Musculoskeletal: Normal range of motion. He exhibits no edema.  Tender lumbar spine  Lymphadenopathy:    He has no cervical adenopathy.  Neurological: He is oriented to person, place, and time. He exhibits normal muscle tone. Coordination normal.  Pos. Straight leg raise right leg  Skin: Rash noted. No erythema.  Eczema right thigh and lower leg  Psychiatric: He has a normal mood and affect. His behavior is normal.    ED Course  Procedures (including critical care time) Labs Review Labs Reviewed - No data to  display Imaging Review Dg Lumbar Spine Complete  08/28/2013   CLINICAL DATA:  Lower back pain.  EXAM: LUMBAR SPINE - COMPLETE 4+ VIEW  COMPARISON:  May 13, 2012.  FINDINGS: There is no evidence of lumbar spine fracture. Alignment is normal. Intervertebral disc spaces are maintained. Posterior facet joints appear normal.  IMPRESSION: Normal lumbar spine.   Electronically Signed   By: Roque Lias M.D.   On: 08/28/2013 07:59     EKG Interpretation None      MDM   Final diagnoses:  Back pain  Hypertension        Benny Lennert, MD 08/28/13 [phone]

## 2013-08-28 NOTE — Discharge Instructions (Signed)
Follow up with Dr. Juanetta Gosling, or Dr. Phillips Odor or Dr. Lodema Hong next week for back and bp

## 2013-10-21 ENCOUNTER — Encounter (HOSPITAL_COMMUNITY): Payer: Self-pay | Admitting: Emergency Medicine

## 2013-10-21 ENCOUNTER — Emergency Department (HOSPITAL_COMMUNITY)
Admission: EM | Admit: 2013-10-21 | Discharge: 2013-10-21 | Disposition: A | Discharge status: Home / Self Care | Payer: 59 | Attending: Emergency Medicine | Admitting: Emergency Medicine

## 2013-10-21 DIAGNOSIS — IMO0002 Reserved for concepts with insufficient information to code with codable children: Secondary | ICD-10-CM | POA: Insufficient documentation

## 2013-10-21 DIAGNOSIS — L259 Unspecified contact dermatitis, unspecified cause: Secondary | ICD-10-CM | POA: Insufficient documentation

## 2013-10-21 DIAGNOSIS — L309 Dermatitis, unspecified: Secondary | ICD-10-CM

## 2013-10-21 DIAGNOSIS — R591 Generalized enlarged lymph nodes: Secondary | ICD-10-CM

## 2013-10-21 DIAGNOSIS — F17200 Nicotine dependence, unspecified, uncomplicated: Secondary | ICD-10-CM | POA: Insufficient documentation

## 2013-10-21 DIAGNOSIS — Z792 Long term (current) use of antibiotics: Secondary | ICD-10-CM | POA: Insufficient documentation

## 2013-10-21 DIAGNOSIS — M549 Dorsalgia, unspecified: Secondary | ICD-10-CM | POA: Insufficient documentation

## 2013-10-21 DIAGNOSIS — R599 Enlarged lymph nodes, unspecified: Secondary | ICD-10-CM | POA: Insufficient documentation

## 2013-10-21 HISTORY — DX: Dorsalgia, unspecified: M54.9

## 2013-10-21 MED ORDER — TRIAMCINOLONE ACETONIDE 0.1 % EX CREA: 1.0000 "application " | TOPICAL_CREAM | Freq: Three times a day (TID) | CUTANEOUS | Status: DC

## 2013-10-21 MED ORDER — DOXYCYCLINE HYCLATE 100 MG PO CAPS: 100.0000 mg | ORAL_CAPSULE | Freq: Two times a day (BID) | ORAL | Status: DC

## 2013-10-21 MED ORDER — HYDROCODONE-ACETAMINOPHEN 7.5-325 MG PO TABS: 1.0000 | ORAL_TABLET | Freq: Four times a day (QID) | ORAL | Status: DC | PRN

## 2013-10-21 NOTE — Discharge Instructions (Signed)
Eczema Eczema, also called atopic dermatitis, is a skin disorder that causes inflammation of the skin. It causes a red rash and dry, scaly skin. The skin becomes very itchy. Eczema is generally worse during the cooler winter months and often improves with the warmth of summer. Eczema usually starts showing signs in infancy. Some children outgrow eczema, but it may last through adulthood.  CAUSES  The exact cause of eczema is not known, but it appears to run in families. People with eczema often have a family history of eczema, allergies, asthma, or hay fever. Eczema is not contagious. Flare-ups of the condition may be caused by:   Contact with something you are sensitive or allergic to.   Stress. SIGNS AND SYMPTOMS  Dry, scaly skin.   Red, itchy rash.   Itchiness. This may occur before the skin rash and may be very intense.  DIAGNOSIS  The diagnosis of eczema is usually made based on symptoms and medical history. TREATMENT  Eczema cannot be cured, but symptoms usually can be controlled with treatment and other strategies. A treatment plan might include:  Controlling the itching and scratching.   Use over-the-counter antihistamines as directed for itching. This is especially useful at night when the itching tends to be worse.   Use over-the-counter steroid creams as directed for itching.   Avoid scratching. Scratching makes the rash and itching worse. It may also result in a skin infection (impetigo) due to a break in the skin caused by scratching.   Keeping the skin well moisturized with creams every day. This will seal in moisture and help prevent dryness. Lotions that contain alcohol and water should be avoided because they can dry the skin.   Limiting exposure to things that you are sensitive or allergic to (allergens).   Recognizing situations that cause stress.   Developing a plan to manage stress.  HOME CARE INSTRUCTIONS   Only take over-the-counter or  prescription medicines as directed by your health care provider.   Do not use anything on the skin without checking with your health care provider.   Keep baths or showers short (5 minutes) in warm (not hot) water. Use mild cleansers for bathing. These should be unscented. You may add nonperfumed bath oil to the bath water. It is best to avoid soap and bubble bath.   Immediately after a bath or shower, when the skin is still damp, apply a moisturizing ointment to the entire body. This ointment should be a petroleum ointment. This will seal in moisture and help prevent dryness. The thicker the ointment, the better. These should be unscented.   Keep fingernails cut short. Children with eczema may need to wear soft gloves or mittens at night after applying an ointment.   Dress in clothes made of cotton or cotton blends. Dress lightly, because heat increases itching.   A child with eczema should stay away from anyone with fever blisters or cold sores. The virus that causes fever blisters (herpes simplex) can cause a serious skin infection in children with eczema. SEEK MEDICAL CARE IF:   Your itching interferes with sleep.   Your rash gets worse or is not better within 1 week after starting treatment.   You see pus or soft yellow scabs in the rash area.   You have a fever.   You have a rash flare-up after contact with someone who has fever blisters.  Document Released: 05/02/2000 Document Revised: 02/23/2013 Document Reviewed: 12/06/2012 [employer] Patient Information 2014 [employer]  

## 2013-10-21 NOTE — ED Notes (Addendum)
Patient states he has had greater than 1 month pain in R leg and has a "knot" at the top of this thigh that pain radiates from.  States yesterday L hip and inner thigh began hurting.  Has been taking ibuprofen which hasn't helped.  Also having flare up of ezcema on R lower leg and ran out of his medication.

## 2013-10-21 NOTE — ED Provider Notes (Signed)
CSN: [id number]     Arrival date & time 10/21/13  1410 History   First MD Initiated Contact with Patient 10/21/13 1430     Chief Complaint  Patient presents with  . Leg Pain  . Eczema     (Consider location/radiation/quality/duration/timing/severity/associated sxs/prior Treatment) Patient is a 43 y.o. male presenting with leg pain. The history is provided by the patient.  Leg Pain Location:  Hip Injury: no   Hip location:  R hip Pain details:    Quality:  Aching and throbbing   Radiates to:  R leg   Severity:  Moderate   Onset quality:  Gradual   Timing:  Constant   Progression:  Unchanged Chronicity:  Chronic Dislocation: no   Prior injury to area:  Yes Relieved by:  Nothing Worsened by:  Activity and bearing weight Ineffective treatments:  NSAIDs Associated symptoms: back pain   Associated symptoms: no decreased ROM, no fatigue, no fever, no itching, no muscle weakness, no neck pain, no numbness, no swelling and no tingling     Patient also c/o exacerbation of his recurrent eczema to the right lower leg.  He states that he ran out of his cream that usually helps control his sx's.  He c/o worsening itching and states the "skin breaks open".  He states that he has seen a dermatologist for this in the past, but has not been evaluated recently.  He denies swelling, redness, drainage, fever, chills, numbness or weakness of the leg.    Past Medical History  Diagnosis Date  . Eczema   . Back pain    History reviewed. No pertinent past surgical history. History reviewed. No pertinent family history. History  Substance Use Topics  . Smoking status: Current Every Day Smoker -- 0.50 packs/day    Types: Cigarettes  . Smokeless tobacco: Not on file  . Alcohol Use: Yes     Comment: occ    Review of Systems  Constitutional: Negative for fever, chills and fatigue.  Genitourinary: Negative for dysuria, flank pain and difficulty urinating.  Musculoskeletal: Positive for  arthralgias and back pain. Negative for joint swelling, neck pain and neck stiffness.  Skin: Positive for rash. Negative for color change, itching and wound.       Itching, rash to the right lower leg  All other systems reviewed and are negative.     Allergies  Tomato  Home Medications   Prior to Admission medications   Medication Sig Start Date End Date Taking? Authorizing Provider  doxycycline (VIBRAMYCIN) 100 MG capsule Take 1 capsule (100 mg total) by mouth 2 (two) times daily. For 10 days 10/21/13   Torrion Witter L. Tipton Ballow, PA-C  HYDROcodone-acetaminophen (NORCO) 7.5-325 MG per tablet Take 1 tablet by mouth every 6 (six) hours as needed for moderate pain. 10/21/13   Greysen Swanton L. Earlene Bjelland, PA-C  triamcinolone cream (KENALOG) 0.1 % Apply 1 application topically 3 (three) times daily. 10/21/13   Jonothan Heberle L. Daren Yeagle, PA-C   BP 159/88  Pulse 75  Temp(Src) 98.6 F (37 C) (Oral)  Resp 20  Ht 5\' 6"  (1.676 m)  Wt 164 lb 14.4 oz (74.798 kg)  BMI 26.63 kg/m2  SpO2 100% Physical Exam  Nursing note and vitals reviewed. Constitutional: He is oriented to person, place, and time. He appears well-developed and well-nourished. No distress.  HENT:  Head: Normocephalic and atraumatic.  Cardiovascular: Normal rate, regular rhythm, normal heart sounds and intact distal pulses.   No murmur heard. Pulmonary/Chest: Effort normal and breath sounds normal.  No respiratory distress. He exhibits no tenderness.  Abdominal: Soft. He exhibits no distension and no mass. There is no tenderness. There is no rebound and no guarding. Hernia confirmed negative in the right inguinal area.  Genitourinary: Testes normal and penis normal. Cremasteric reflex is present. No penile tenderness.  No penile discharge, scrotum NT  Musculoskeletal: Normal range of motion.  Lymphadenopathy:       Right: Inguinal adenopathy present.  Several slightly enlarged, tender lymph nodes of the right groin.   Neurological: He is alert and oriented  to person, place, and time. He exhibits normal muscle tone. Coordination normal.  Skin: Skin is warm and dry.  Chronic appearing, plaque to the medial aspect of the right lower leg.  Appears excoriated w/o drainage, edema or erythema.      ED Course  Procedures (including critical care time) Labs Review Labs Reviewed - No data to display  Imaging Review No results found.   EKG Interpretation None      MDM   Final diagnoses:  Eczema  Lymphadenopathy   Previous ED chart reviewed, pt has been evaluated here in the past for same.  eczema appears chronic w/o secondary infection at this time.  Compartments of the right leg are soft.  NV intact.  Pt appears to have shaven the pubic region which may be cause for the lymphadenopathy.  No hx or concerning sx's for STD's    Pt is well appearing, ambulates with a steady gait.  No focal neuro deficits on exam.  I have advised pt that he will need to f/u with his PMD or dermatology for ongoing management of his eczema.  Pt agrees to plan and appears stable for d/c.     Malaney Mcbean L. Angeles Zehner, PA-C 10/23/13 1310

## 2013-10-21 NOTE — ED Notes (Signed)
Low back pain, with radiation down both legs.Also says he has a "knot" on rt upper thigh that is bothering him.

## 2013-10-25 NOTE — ED Provider Notes (Signed)
Medical screening examination/treatment/procedure(s) were performed by non-physician practitioner and as supervising physician I was immediately available for consultation/collaboration.   EKG Interpretation None        Emberlynn Riggan M Elvenia Godden, DO 10/25/13 1039 

## 2013-12-30 ENCOUNTER — Encounter (HOSPITAL_COMMUNITY): Payer: Self-pay | Admitting: Emergency Medicine

## 2013-12-30 ENCOUNTER — Emergency Department (HOSPITAL_COMMUNITY)
Admission: EM | Admit: 2013-12-30 | Discharge: 2013-12-30 | Disposition: A | Discharge status: Home / Self Care | Payer: 59 | Attending: Emergency Medicine | Admitting: Emergency Medicine

## 2013-12-30 DIAGNOSIS — M25519 Pain in unspecified shoulder: Secondary | ICD-10-CM | POA: Diagnosis not present

## 2013-12-30 DIAGNOSIS — Z791 Long term (current) use of non-steroidal anti-inflammatories (NSAID): Secondary | ICD-10-CM | POA: Insufficient documentation

## 2013-12-30 DIAGNOSIS — Z872 Personal history of diseases of the skin and subcutaneous tissue: Secondary | ICD-10-CM | POA: Diagnosis not present

## 2013-12-30 DIAGNOSIS — F17200 Nicotine dependence, unspecified, uncomplicated: Secondary | ICD-10-CM | POA: Diagnosis not present

## 2013-12-30 DIAGNOSIS — IMO0001 Reserved for inherently not codable concepts without codable children: Secondary | ICD-10-CM | POA: Diagnosis not present

## 2013-12-30 DIAGNOSIS — IMO0002 Reserved for concepts with insufficient information to code with codable children: Secondary | ICD-10-CM | POA: Diagnosis not present

## 2013-12-30 DIAGNOSIS — Z792 Long term (current) use of antibiotics: Secondary | ICD-10-CM | POA: Insufficient documentation

## 2013-12-30 DIAGNOSIS — M25561 Pain in right knee: Secondary | ICD-10-CM

## 2013-12-30 DIAGNOSIS — M25559 Pain in unspecified hip: Secondary | ICD-10-CM | POA: Diagnosis not present

## 2013-12-30 DIAGNOSIS — M25569 Pain in unspecified knee: Secondary | ICD-10-CM | POA: Diagnosis present

## 2013-12-30 DIAGNOSIS — M791 Myalgia, unspecified site: Secondary | ICD-10-CM

## 2013-12-30 MED ORDER — OXYCODONE-ACETAMINOPHEN 5-325 MG PO TABS: 1.0000 | ORAL_TABLET | ORAL | Status: DC | PRN

## 2013-12-30 MED ORDER — PREDNISONE 50 MG PO TABS: 50.0000 mg | ORAL_TABLET | Freq: Every day | ORAL | Status: DC

## 2013-12-30 MED ORDER — PREDNISONE 50 MG PO TABS
60.0000 mg | ORAL_TABLET | Freq: Once | ORAL | Status: AC
  Administered 2013-12-30: 60 mg via ORAL
  Filled 2013-12-30 (×2): qty 1

## 2013-12-30 NOTE — Discharge Instructions (Signed)
Arthritis, Nonspecific °Arthritis is inflammation of a joint. This usually means pain, redness, warmth or swelling are present. One or more joints may be involved. There are a number of types of arthritis. Your caregiver may not be able to tell what type of arthritis you have right away. °CAUSES  °The most common cause of arthritis is the wear and tear on the joint (osteoarthritis). This causes damage to the cartilage, which can break down over time. The knees, hips, back and neck are most often affected by this type of arthritis. °Other types of arthritis and common causes of joint pain include: °· Sprains and other injuries near the joint. Sometimes minor sprains and injuries cause pain and swelling that develop hours later. °· Rheumatoid arthritis. This affects hands, feet and knees. It usually affects both sides of your body at the same time. It is often associated with chronic ailments, fever, weight loss and general weakness. °· Crystal arthritis. Gout and pseudo gout can cause occasional acute severe pain, redness and swelling in the foot, ankle, or knee. °· Infectious arthritis. Bacteria can get into a joint through a break in overlying skin. This can cause infection of the joint. Bacteria and viruses can also spread through the blood and affect your joints. °· Drug, infectious and allergy reactions. Sometimes joints can become mildly painful and slightly swollen with these types of illnesses. °SYMPTOMS  °· Pain is the main symptom. °· Your joint or joints can also be red, swollen and warm or hot to the touch. °· You may have a fever with certain types of arthritis, or even feel overall ill. °· The joint with arthritis will hurt with movement. Stiffness is present with some types of arthritis. °DIAGNOSIS  °Your caregiver will suspect arthritis based on your description of your symptoms and on your exam. Testing may be needed to find the type of arthritis: °· Blood and sometimes urine tests. °· X-ray tests  and sometimes CT or MRI scans. °· Removal of fluid from the joint (arthrocentesis) is done to check for bacteria, crystals or other causes. Your caregiver (or a specialist) will numb the area over the joint with a local anesthetic, and use a needle to remove joint fluid for examination. This procedure is only minimally uncomfortable. °· Even with these tests, your caregiver may not be able to tell what kind of arthritis you have. Consultation with a specialist (rheumatologist) may be helpful. °TREATMENT  °Your caregiver will discuss with you treatment specific to your type of arthritis. If the specific type cannot be determined, then the following general recommendations may apply. °Treatment of severe joint pain includes: °· Rest. °· Elevation. °· Anti-inflammatory medication (for example, ibuprofen) may be prescribed. Avoiding activities that cause increased pain. °· Only take over-the-counter or prescription medicines for pain and discomfort as recommended by your caregiver. °· Cold packs over an inflamed joint may be used for 10 to 15 minutes every hour. Hot packs sometimes feel better, but do not use overnight. Do not use hot packs if you are diabetic without your caregiver's permission. °· A cortisone shot into arthritic joints may help reduce pain and swelling. °· Any acute arthritis that gets worse over the next 1 to 2 days needs to be looked at to be sure there is no joint infection. °Long-term arthritis treatment involves modifying activities and lifestyle to reduce joint stress jarring. This can include weight loss. Also, exercise is needed to nourish the joint cartilage and remove waste. This helps keep the muscles   around the joint strong. HOME CARE INSTRUCTIONS   Do not take aspirin to relieve pain if gout is suspected. This elevates uric acid levels.  Only take over-the-counter or prescription medicines for pain, discomfort or fever as directed by your caregiver.  Rest the joint as much as  possible.  If your joint is swollen, keep it elevated.  Use crutches if the painful joint is in your leg.  Drinking plenty of fluids may help for certain types of arthritis.  Follow your caregiver's dietary instructions.  Try low-impact exercise such as:  Swimming.  Water aerobics.  Biking.  Walking.  Morning stiffness is often relieved by a warm shower.  Put your joints through regular range-of-motion. SEEK MEDICAL CARE IF:   You do not feel better in 24 hours or are getting worse.  You have side effects to medications, or are not getting better with treatment. SEEK IMMEDIATE MEDICAL CARE IF:   You have a fever.  You develop severe joint pain, swelling or redness.  Many joints are involved and become painful and swollen.  There is severe back pain and/or leg weakness.  You have loss of bowel or bladder control. Document Released: 06/12/2004 Document Revised: 07/28/2011 Document Reviewed: 06/28/2008 [facility] Patient Information 2015 [place], [place]. This information is not intended to replace advice given to you by your health care provider. Make sure you discuss any questions you have with your health care provider.  Prednisone tablets What is this medicine? PREDNISONE (PRED ni sone) is a corticosteroid. It is commonly used to treat inflammation of the skin, joints, lungs, and other organs. Common conditions treated include asthma, allergies, and arthritis. It is also used for other conditions, such as blood disorders and diseases of the adrenal glands. This medicine may be used for other purposes; ask your health care provider or pharmacist if you have questions. COMMON BRAND NAME(S): Deltasone, Predone, Sterapred, Sterapred DS What should I tell my health care provider before I take this medicine? They need to know if you have any of these conditions: -Cushing's syndrome -diabetes -glaucoma -heart disease -high blood pressure -infection (especially a virus  infection such as chickenpox, cold sores, or herpes) -kidney disease -liver disease -mental illness -myasthenia gravis -osteoporosis -seizures -stomach or intestine problems -thyroid disease -an unusual or allergic reaction to lactose, prednisone, other medicines, foods, dyes, or preservatives -pregnant or trying to get pregnant -breast-feeding How should I use this medicine? Take this medicine by mouth with a glass of water. Follow the directions on the prescription label. Take this medicine with food. If you are taking this medicine once a day, take it in the morning. Do not take more medicine than you are told to take. Do not suddenly stop taking your medicine because you may develop a severe reaction. Your doctor will tell you how much medicine to take. If your doctor wants you to stop the medicine, the dose may be slowly lowered over time to avoid any side effects. Talk to your pediatrician regarding the use of this medicine in children. Special care may be needed. Overdosage: If you think you have taken too much of this medicine contact a poison control center or emergency room at once. NOTE: This medicine is only for you. Do not share this medicine with others. What if I miss a dose? If you miss a dose, take it as soon as you can. If it is almost time for your next dose, talk to your doctor or health care professional. You may need to  miss a dose or take an extra dose. Do not take double or extra doses without advice. What may interact with this medicine? Do not take this medicine with any of the following medications: -metyrapone -mifepristone This medicine may also interact with the following medications: -aminoglutethimide -amphotericin B -aspirin and aspirin-like medicines -barbiturates -certain medicines for diabetes, like glipizide or glyburide -cholestyramine -cholinesterase inhibitors -cyclosporine -digoxin -diuretics -ephedrine -male hormones, like estrogens and  birth control pills -isoniazid -ketoconazole -NSAIDS, medicines for pain and inflammation, like ibuprofen or naproxen -phenytoin -rifampin -toxoids -vaccines -warfarin This list may not describe all possible interactions. Give your health care provider a list of all the medicines, herbs, non-prescription drugs, or dietary supplements you use. Also tell them if you smoke, drink alcohol, or use illegal drugs. Some items may interact with your medicine. What should I watch for while using this medicine? Visit your doctor or health care professional for regular checks on your progress. If you are taking this medicine over a prolonged period, carry an identification card with your name and address, the type and dose of your medicine, and your doctor's name and address. This medicine may increase your risk of getting an infection. Tell your doctor or health care professional if you are around anyone with measles or chickenpox, or if you develop sores or blisters that do not heal properly. If you are going to have surgery, tell your doctor or health care professional that you have taken this medicine within the last twelve months. Ask your doctor or health care professional about your diet. You may need to lower the amount of salt you eat. This medicine may affect blood sugar levels. If you have diabetes, check with your doctor or health care professional before you change your diet or the dose of your diabetic medicine. What side effects may I notice from receiving this medicine? Side effects that you should report to your doctor or health care professional as soon as possible: -allergic reactions like skin rash, itching or hives, swelling of the face, lips, or tongue -changes in emotions or moods -changes in vision -depressed mood -eye pain -fever or chills, cough, sore throat, pain or difficulty passing urine -increased thirst -swelling of ankles, feet Side effects that usually do not require  medical attention (report to your doctor or health care professional if they continue or are bothersome): -confusion, excitement, restlessness -headache -nausea, vomiting -skin problems, acne, thin and shiny skin -trouble sleeping -weight gain This list may not describe all possible side effects. Call your doctor for medical advice about side effects. You may report side effects to FDA at 1-800-FDA-1088. Where should I keep my medicine? Keep out of the reach of children. Store at room temperature between 15 and 30 degrees C (59 and 86 degrees F). Protect from light. Keep container tightly closed. Throw away any unused medicine after the expiration date. NOTE: This sheet is a summary. It may not cover all possible information. If you have questions about this medicine, talk to your doctor, pharmacist, or health care provider.  2015, [employer] Standard. (2010-12-19 10:57:14)  Acetaminophen; Oxycodone tablets What is this medicine? ACETAMINOPHEN; OXYCODONE (a set a MEE noe fen; ox i KOE done) is a pain reliever. It is used to treat mild to moderate pain. This medicine may be used for other purposes; ask your health care provider or pharmacist if you have questions. COMMON BRAND NAME(S): Endocet, Magnacet, Narvox, Percocet, Perloxx, Primalev, Primlev, Roxicet, Xolox What should I tell my health care provider before  I take this medicine? They need to know if you have any of these conditions: -brain tumor -Crohn's disease, inflammatory bowel disease, or ulcerative colitis -drug abuse or addiction -head injury -heart or circulation problems -if you often drink alcohol -kidney disease or problems going to the bathroom -liver disease -lung disease, asthma, or breathing problems -an unusual or allergic reaction to acetaminophen, oxycodone, other opioid analgesics, other medicines, foods, dyes, or preservatives -pregnant or trying to get pregnant -breast-feeding How should I use this  medicine? Take this medicine by mouth with a full glass of water. Follow the directions on the prescription label. Take your medicine at regular intervals. Do not take your medicine more often than directed. Talk to your pediatrician regarding the use of this medicine in children. Special care may be needed. Patients over 41 years old may have a stronger reaction and need a smaller dose. Overdosage: If you think you have taken too much of this medicine contact a poison control center or emergency room at once. NOTE: This medicine is only for you. Do not share this medicine with others. What if I miss a dose? If you miss a dose, take it as soon as you can. If it is almost time for your next dose, take only that dose. Do not take double or extra doses. What may interact with this medicine? -alcohol -antihistamines -barbiturates like amobarbital, butalbital, butabarbital, methohexital, pentobarbital, phenobarbital, thiopental, and secobarbital -benztropine -drugs for bladder problems like solifenacin, trospium, oxybutynin, tolterodine, hyoscyamine, and methscopolamine -drugs for breathing problems like ipratropium and tiotropium -drugs for certain stomach or intestine problems like propantheline, homatropine methylbromide, glycopyrrolate, atropine, belladonna, and dicyclomine -general anesthetics like etomidate, ketamine, nitrous oxide, propofol, desflurane, enflurane, halothane, isoflurane, and sevoflurane -medicines for depression, anxiety, or psychotic disturbances -medicines for sleep -muscle relaxants -naltrexone -narcotic medicines (opiates) for pain -phenothiazines like perphenazine, thioridazine, chlorpromazine, mesoridazine, fluphenazine, prochlorperazine, promazine, and trifluoperazine -scopolamine -tramadol -trihexyphenidyl This list may not describe all possible interactions. Give your health care provider a list of all the medicines, herbs, non-prescription drugs, or dietary  supplements you use. Also tell them if you smoke, drink alcohol, or use illegal drugs. Some items may interact with your medicine. What should I watch for while using this medicine? Tell your doctor or health care professional if your pain does not go away, if it gets worse, or if you have new or a different type of pain. You may develop tolerance to the medicine. Tolerance means that you will need a higher dose of the medication for pain relief. Tolerance is normal and is expected if you take this medicine for a long time. Do not suddenly stop taking your medicine because you may develop a severe reaction. Your body becomes used to the medicine. This does NOT mean you are addicted. Addiction is a behavior related to getting and using a drug for a non-medical reason. If you have pain, you have a medical reason to take pain medicine. Your doctor will tell you how much medicine to take. If your doctor wants you to stop the medicine, the dose will be slowly lowered over time to avoid any side effects. You may get drowsy or dizzy. Do not drive, use machinery, or do anything that needs mental alertness until you know how this medicine affects you. Do not stand or sit up quickly, especially if you are an older patient. This reduces the risk of dizzy or fainting spells. Alcohol may interfere with the effect of this medicine. Avoid alcoholic drinks. There  are different types of narcotic medicines (opiates) for pain. If you take more than one type at the same time, you may have more side effects. Give your health care provider a list of all medicines you use. Your doctor will tell you how much medicine to take. Do not take more medicine than directed. Call emergency for help if you have problems breathing. The medicine will cause constipation. Try to have a bowel movement at least every 2 to 3 days. If you do not have a bowel movement for 3 days, call your doctor or health care professional. Do not take Tylenol  (acetaminophen) or medicines that have acetaminophen with this medicine. Too much acetaminophen can be very dangerous. Many nonprescription medicines contain acetaminophen. Always read the labels carefully to avoid taking more acetaminophen. What side effects may I notice from receiving this medicine? Side effects that you should report to your doctor or health care professional as soon as possible: -allergic reactions like skin rash, itching or hives, swelling of the face, lips, or tongue -breathing difficulties, wheezing -confusion -light headedness or fainting spells -severe stomach pain -unusually weak or tired -yellowing of the skin or the whites of the eyes Side effects that usually do not require medical attention (report to your doctor or health care professional if they continue or are bothersome): -dizziness -drowsiness -nausea -vomiting This list may not describe all possible side effects. Call your doctor for medical advice about side effects. You may report side effects to FDA at 1-800-FDA-1088. Where should I keep my medicine? Keep out of the reach of children. This medicine can be abused. Keep your medicine in a safe place to protect it from theft. Do not share this medicine with anyone. Selling or giving away this medicine is dangerous and against the law. Store at room temperature between 20 and 25 degrees C (68 and 77 degrees F). Keep container tightly closed. Protect from light. This medicine may cause accidental overdose and death if it is taken by other adults, children, or pets. Flush any unused medicine down the toilet to reduce the chance of harm. Do not use the medicine after the expiration date. NOTE: This sheet is a summary. It may not cover all possible information. If you have questions about this medicine, talk to your doctor, pharmacist, or health care provider.  2015, [employer] Standard. (2012-12-27 13:17:35)

## 2013-12-30 NOTE — ED Provider Notes (Signed)
CSN: [id number]     Arrival date & time 12/30/13  [id number] History   First MD Initiated Contact with Patient 12/30/13 [phone]     Chief Complaint  Patient presents with  . Muscle Pain     (Consider location/radiation/quality/duration/timing/severity/associated sxs/prior Treatment) Patient is a 43 y.o. male presenting with musculoskeletal pain. The history is provided by the patient.  Muscle Pain  He is a very difficult historian, but he apparently has been having pains in his shoulders and thighs and right knee for the last 2 weeks. This was pain is in his right knee. He rates pain at 10/10. This was with standing and walking and movement. On further questioning, he has been in the ED multiple times over the last one to 2 years and has had x-rays of his knee and MRI of his back which have not shown any obvious problem. He states he went him he actually got significant relief was when he was given a course of prednisone at an ED visit in about 18 months ago. Any history of trauma. His only medical problem is eczema.  Past Medical History  Diagnosis Date  . Eczema   . Back pain    History reviewed. No pertinent past surgical history. No family history on file. History  Substance Use Topics  . Smoking status: Current Every Day Smoker -- 0.50 packs/day    Types: Cigarettes  . Smokeless tobacco: Not on file  . Alcohol Use: Yes     Comment: occ    Review of Systems  All other systems reviewed and are negative.     Allergies  Tomato  Home Medications   Prior to Admission medications   Medication Sig Start Date End Date Taking? Authorizing Provider  ibuprofen (ADVIL,MOTRIN) 400 MG tablet Take 400 mg by mouth every 6 (six) hours as needed.   Yes Historical Provider, MD  doxycycline (VIBRAMYCIN) 100 MG capsule Take 1 capsule (100 mg total) by mouth 2 (two) times daily. For 10 days 10/21/13   Tammy L. Triplett, PA-C  HYDROcodone-acetaminophen (NORCO) 7.5-325 MG per tablet Take 1 tablet by  mouth every 6 (six) hours as needed for moderate pain. 10/21/13   Tammy L. Triplett, PA-C  triamcinolone cream (KENALOG) 0.1 % Apply 1 application topically 3 (three) times daily. 10/21/13   Tammy L. Triplett, PA-C   BP 144/103  Pulse 72  Temp(Src) 98.7 F (37.1 C) (Oral)  Resp 16  SpO2 100% Physical Exam  Nursing note and vitals reviewed.  43 year old male, resting comfortably and in no acute distress. Vital signs are significant for hypertension. Oxygen saturation is 100%, which is normal. Head is normocephalic and atraumatic. PERRLA, EOMI. Oropharynx is clear. Neck is nontender and supple without adenopathy or JVD. Back is nontender and there is no CVA tenderness. Lungs are clear without rales, wheezes, or rhonchi. Chest is nontender. Heart has regular rate and rhythm without murmur. Abdomen is soft, flat, nontender without masses or hepatosplenomegaly and peristalsis is normoactive. Extremities have no cyanosis or edema, full range of motion is present. There is a right inguinal lymph node which is moderately tender. There is pain on ROM right knee. There is no effusion or swelling. There is no palpable synovial thickening. Skin is warm and dry. Scattered plaques consistent with eczema. Neurologic: Mental status is normal, cranial nerves are intact, there are no motor or sensory deficits.  ED Course  Procedures (including critical care time)  MDM   Final diagnoses:  Pain in right  knee  Myalgia    Knee pain which appears to be chronic. There  Is also shoulder pain and thigh pain. I am concerned that he may have an autoimmune arthritis. He has eczema, but no nail changes to suggest psoriatic arthritis. Records are reviewed and he has had several visits for complaints of knee and shoulder pain and did receive a course of prednisone and an ED visit in December 2013. He will be referred to a rheumatologist, and is discharged with a prescription for prednisone and  oxycodone-acetaminophen.     Dione Booze, MD 12/30/13 [phone]

## 2013-12-30 NOTE — ED Notes (Signed)
Pt c/o pain in right leg and both arms x several weeks.

## 2014-02-16 ENCOUNTER — Encounter (HOSPITAL_COMMUNITY): Payer: Self-pay | Admitting: Emergency Medicine

## 2014-02-16 ENCOUNTER — Emergency Department (HOSPITAL_COMMUNITY)
Admission: EM | Admit: 2014-02-16 | Discharge: 2014-02-16 | Disposition: A | Discharge status: Home / Self Care | Payer: 59 | Attending: Emergency Medicine | Admitting: Emergency Medicine

## 2014-02-16 DIAGNOSIS — Z720 Tobacco use: Secondary | ICD-10-CM | POA: Diagnosis not present

## 2014-02-16 DIAGNOSIS — Z0000 Encounter for general adult medical examination without abnormal findings: Secondary | ICD-10-CM | POA: Diagnosis not present

## 2014-02-16 DIAGNOSIS — G8929 Other chronic pain: Secondary | ICD-10-CM | POA: Insufficient documentation

## 2014-02-16 DIAGNOSIS — I10 Essential (primary) hypertension: Secondary | ICD-10-CM

## 2014-02-16 DIAGNOSIS — M549 Dorsalgia, unspecified: Secondary | ICD-10-CM | POA: Insufficient documentation

## 2014-02-16 DIAGNOSIS — Z7952 Long term (current) use of systemic steroids: Secondary | ICD-10-CM | POA: Insufficient documentation

## 2014-02-16 DIAGNOSIS — Z792 Long term (current) use of antibiotics: Secondary | ICD-10-CM | POA: Insufficient documentation

## 2014-02-16 DIAGNOSIS — K625 Hemorrhage of anus and rectum: Secondary | ICD-10-CM | POA: Diagnosis present

## 2014-02-16 DIAGNOSIS — Z872 Personal history of diseases of the skin and subcutaneous tissue: Secondary | ICD-10-CM | POA: Insufficient documentation

## 2014-02-16 LAB — URINALYSIS, ROUTINE W REFLEX MICROSCOPIC
BILIRUBIN URINE: NEGATIVE
GLUCOSE, UA: NEGATIVE mg/dL
HGB URINE DIPSTICK: NEGATIVE
Ketones, ur: NEGATIVE mg/dL
Leukocytes, UA: NEGATIVE
Nitrite: NEGATIVE
PH: 6 (ref 5.0–8.0)
Protein, ur: NEGATIVE mg/dL
SPECIFIC GRAVITY, URINE: 1.015 (ref 1.005–1.030)
Urobilinogen, UA: 0.2 mg/dL (ref 0.0–1.0)

## 2014-02-16 LAB — I-STAT CHEM 8, ED
BUN: 14 mg/dL (ref 6–23)
CHLORIDE: 104 meq/L (ref 96–112)
CREATININE: 0.7 mg/dL (ref 0.50–1.35)
Calcium, Ion: 1.18 mmol/L (ref 1.12–1.23)
GLUCOSE: 90 mg/dL (ref 70–99)
HCT: 44 % (ref 39.0–52.0)
HEMOGLOBIN: 15 g/dL (ref 13.0–17.0)
POTASSIUM: 4 meq/L (ref 3.7–5.3)
SODIUM: 138 meq/L (ref 137–147)
TCO2: 25 mmol/L (ref 0–100)

## 2014-02-16 NOTE — ED Notes (Signed)
Pt reports he has some rectal bleeding for past 5 months. Came in to have "prostate checked". States last episode of blood in stool was 2 weeks ago. No abdominal pain.

## 2014-02-16 NOTE — Discharge Instructions (Signed)
Health Maintenance  As we discussed, your exam today does not rule out a prostate problem. Establish care with a regular Dr. Return to the ED if you develop new or worsening symptoms. A healthy lifestyle and preventative care can promote health and wellness.  Maintain regular health, dental, and eye exams.  Eat a healthy diet. Foods like vegetables, fruits, whole grains, low-fat dairy products, and lean protein foods contain the nutrients you need and are low in calories. Decrease your intake of foods high in solid fats, added sugars, and salt. Get information about a proper diet from your health care provider, if necessary.  Regular physical exercise is one of the most important things you can do for your health. Most adults should get at least 150 minutes of moderate-intensity exercise (any activity that increases your heart rate and causes you to sweat) each week. In addition, most adults need muscle-strengthening exercises on 2 or more days a week.   Maintain a healthy weight. The body mass index (BMI) is a screening tool to identify possible weight problems. It provides an estimate of body fat based on height and weight. Your health care provider can find your BMI and can help you achieve or maintain a healthy weight. For males 20 years and older:  A BMI below 18.5 is considered underweight.  A BMI of 18.5 to 24.9 is normal.  A BMI of 25 to 29.9 is considered overweight.  A BMI of 30 and above is considered obese.  Maintain normal blood lipids and cholesterol by exercising and minimizing your intake of saturated fat. Eat a balanced diet with plenty of fruits and vegetables. Blood tests for lipids and cholesterol should begin at age 77 and be repeated every 5 years. If your lipid or cholesterol levels are high, you are over age 60, or you are at high risk for heart disease, you may need your cholesterol levels checked more frequently.Ongoing high lipid and cholesterol levels should be  treated with medicines if diet and exercise are not working.  If you smoke, find out from your health care provider how to quit. If you do not use tobacco, do not start.  Lung cancer screening is recommended for adults aged 55-80 years who are at high risk for developing lung cancer because of a history of smoking. A yearly low-dose CT scan of the lungs is recommended for people who have at least a 30-pack-year history of smoking and are current smokers or have quit within the past 15 years. A pack year of smoking is smoking an average of 1 pack of cigarettes a day for 1 year (for example, a 30-pack-year history of smoking could mean smoking 1 pack a day for 30 years or 2 packs a day for 15 years). Yearly screening should continue until the smoker has stopped smoking for at least 15 years. Yearly screening should be stopped for people who develop a health problem that would prevent them from having lung cancer treatment.  If you choose to drink alcohol, do not have more than 2 drinks per day. One drink is considered to be 12 oz (360 mL) of beer, 5 oz (150 mL) of wine, or 1.5 oz (45 mL) of liquor.  Avoid the use of street drugs. Do not share needles with anyone. Ask for help if you need support or instructions about stopping the use of drugs.  High blood pressure causes heart disease and increases the risk of stroke. Blood pressure should be checked at least every 1-2  years. Ongoing high blood pressure should be treated with medicines if weight loss and exercise are not effective.  If you are 74-68 years old, ask your health care provider if you should take aspirin to prevent heart disease.  Diabetes screening involves taking a blood sample to check your fasting blood sugar level. This should be done once every 3 years after age 52 if you are at a normal weight and without risk factors for diabetes. Testing should be considered at a younger age or be carried out more frequently if you are overweight and  have at least 1 risk factor for diabetes.  Colorectal cancer can be detected and often prevented. Most routine colorectal cancer screening begins at the age of 13 and continues through age 69. However, your health care provider may recommend screening at an earlier age if you have risk factors for colon cancer. On a yearly basis, your health care provider may provide home test kits to check for hidden blood in the stool. A small camera at the end of a tube may be used to directly examine the colon (sigmoidoscopy or colonoscopy) to detect the earliest forms of colorectal cancer. Talk to your health care provider about this at age 12 when routine screening begins. A direct exam of the colon should be repeated every 5-10 years through age 65, unless early forms of precancerous polyps or small growths are found.  People who are at an increased risk for hepatitis B should be screened for this virus. You are considered at high risk for hepatitis B if:  You were born in a country where hepatitis B occurs often. Talk with your health care provider about which countries are considered high risk.  Your parents were born in a high-risk country and you have not received a shot to protect against hepatitis B (hepatitis B vaccine).  You have HIV or AIDS.  You use needles to inject street drugs.  You live with, or have sex with, someone who has hepatitis B.  You are a man who has sex with other men (MSM).  You get hemodialysis treatment.  You take certain medicines for conditions like cancer, organ transplantation, and autoimmune conditions.  Hepatitis C blood testing is recommended for all people born from [birth year] through 1963 and any individual with known risk factors for hepatitis C.  Healthy men should no longer receive prostate-specific antigen (PSA) blood tests as part of routine cancer screening. Talk to your health care provider about prostate cancer screening.  Testicular cancer screening is not  recommended for adolescents or adult males who have no symptoms. Screening includes self-exam, a health care provider exam, and other screening tests. Consult with your health care provider about any symptoms you have or any concerns you have about testicular cancer.  Practice safe sex. Use condoms and avoid high-risk sexual practices to reduce the spread of sexually transmitted infections (STIs).  You should be screened for STIs, including gonorrhea and chlamydia if:  You are sexually active and are younger than 24 years.  You are older than 24 years, and your health care provider tells you that you are at risk for this type of infection.  Your sexual activity has changed since you were last screened, and you are at an increased risk for chlamydia or gonorrhea. Ask your health care provider if you are at risk.  If you are at risk of being infected with HIV, it is recommended that you take a prescription medicine daily to prevent HIV infection.  This is called pre-exposure prophylaxis (PrEP). You are considered at risk if:  You are a man who has sex with other men (MSM).  You are a heterosexual man who is sexually active with multiple partners.  You take drugs by injection.  You are sexually active with a partner who has HIV.  Talk with your health care provider about whether you are at high risk of being infected with HIV. If you choose to begin PrEP, you should first be tested for HIV. You should then be tested every 3 months for as long as you are taking PrEP.  Use sunscreen. Apply sunscreen liberally and repeatedly throughout the day. You should seek shade when your shadow is shorter than you. Protect yourself by wearing long sleeves, pants, a wide-brimmed hat, and sunglasses year round whenever you are outdoors.  Tell your health care provider of new moles or changes in moles, especially if there is a change in shape or color. Also, tell your health care provider if a mole is larger  than the size of a pencil eraser.  A one-time screening for abdominal aortic aneurysm (AAA) and surgical repair of large AAAs by ultrasound is recommended for men aged 65-75 years who are current or former smokers.  Stay current with your vaccines (immunizations). Document Released: 11/01/2007 Document Revised: 05/10/2013 Document Reviewed: 09/30/2010 [facility] Patient Information 2015 [place], [place]. This information is not intended to replace advice given to you by your health care provider. Make sure you discuss any questions you have with your health care provider.  Emergency Department Resource Guide 1) Find a Doctor and Pay Out of Pocket Although you won't have to find out who is covered by your insurance plan, it is a good idea to ask around and get recommendations. You will then need to call the office and see if the doctor you have chosen will accept you as a new patient and what types of options they offer for patients who are self-pay. Some doctors offer discounts or will set up payment plans for their patients who do not have insurance, but you will need to ask so you aren't surprised when you get to your appointment.  2) Contact Your Local Health Department Not all health departments have doctors that can see patients for sick visits, but many do, so it is worth a call to see if yours does. If you don't know where your local health department is, you can check in your phone book. The CDC also has a tool to help you locate your state's health department, and many state websites also have listings of all of their local health departments.  3) Find a Walk-in Clinic If your illness is not likely to be very severe or complicated, you may want to try a walk in clinic. These are popping up all over the country in pharmacies, drugstores, and shopping centers. They're usually staffed by nurse practitioners or physician assistants that have been trained to treat common illnesses and complaints.  They're usually fairly quick and inexpensive. However, if you have serious medical issues or chronic medical problems, these are probably not your best option.  No Primary Care Doctor: - Call Health Connect at  [phone] - they can help you locate a primary care doctor that  accepts your insurance, provides certain services, etc. - Physician Referral Service- [phone]  Chronic Pain Problems: Organization         Address  Phone   Notes  [facility]  [phone]  [phone] Patients need to be referred by their primary care doctor.   Medication Assistance: Organization         Address  Phone   Notes  [facility] Medication [facility] [address] [place] [place], [place] [address] [phone] --Must be a resident of [facility] -- Must have NO insurance coverage whatsoever (no Medicaid/ Medicare, etc.) -- The pt. MUST have a primary care doctor that directs their care regularly and follows them in the community   [facility]  [phone]   [employer]  [phone]    Agencies that provide inexpensive medical care: Organization         Address  Phone   Notes  [facility]  [phone]   [facility]    [phone]   [facility] [address] [place], [place] [address] [phone]   [facility] [address] [place] [place] [phone]   Planned Parenthood    [phone]   [facility]    [phone]   [facility]  [address], [place] Phone:  [phone], Fax:  [phone] Hours of Operation:  9 am - 6 pm, M-F.  Also accepts Medicaid/Medicare and self-pay.  [facility]  [address], [place] Phone: [phone], Fax: [phone]. Hours of Operation:  8:30 am - 5:30 pm, M-F.  Also accepts Medicaid and self-pay.  [facility] [place] [address], [place]  Phone: [phone]   [facility] [address] Natasha Bence [place], [place] [phone], Ext. [phone] Mondays & Thursdays: 7-9 AM.  First 15 patients are seen on a first come, first serve basis.    Medicaid-accepting [facility] Providers:  Organization         Address  Phone   Notes  [facility] [address],  [phone] Also accepts self-pay patients.  [facility] [address] Laurell Josephs [place], [place]  [phone]   [facility] [address], [place] [phone]   [facility] [address] [place] [phone]   Renaye Rakers [address], [place]   [phone] Only accepts [place] patients after they have their name applied to their card.   Self-Pay (no insurance) in [facility]:  Organization         Address  Phone   Notes  Sickle Cell Patients, [facility] [address] [place], [place] [phone]   [facility] [address] [place], [place] [phone]   [facility] [place]  [address], [place] [phone]   [facility]/Dr. Osei-Bonsu  [address] [place] or [address], [place] [phone] Phone number for both [place] and [place] locations is the same.  [facility] [address], [place] [phone]   [facility] [address] [place] or [address] [phone]   [facility] [address], [place] [phone], phone; [phone], fax Sees patients 1st and 3rd Saturday of every month.  Must not qualify for public or private insurance (i.e. Medicaid, Medicare, [place] Health Choice, Veterans' Benefits)  Household  income should be no more than 200% of the poverty level The clinic cannot  treat you if you are pregnant or think you are pregnant  Sexually transmitted diseases are not treated at the clinic.    Dental Care: Organization         Address  Phone  Notes  [facility] Department of [facility] [address] [place], [place] [phone] Accepts children up to age 67 who are enrolled in [place] or [place] Health Choice; pregnant women with a Medicaid card; and children who have applied for Medicaid or [place] Health Choice, but were declined, whose parents can pay a reduced fee at time of service.  [facility] Department of [facility]  [address], [place] [phone] Accepts children up to age 36 who are enrolled in [place] or [place] Health Choice; pregnant women with a Medicaid card; and children who have applied for Medicaid or [place] Health Choice, but were declined, whose parents can pay a reduced fee at time of service.  [facility]  [address] [place], [place] [phone] Patients are seen by appointment only. Walk-ins are not accepted. [facility] will see patients 91 years of age and older. Monday - Tuesday (8am-5pm) Most Wednesdays (8:30-5pm) $30 per visit, cash only  [facility] Adult Dental Access PRO[facility]  [address], [facility] [phone] Patients are seen by appointment only. Walk-ins are not accepted. [facility] will see patients 31 years of age and older. One Wednesday Evening (Monthly: Volunteer Based).  $30 per visit, cash only  [employer]  [phone] for adults; Children under age 81, call Graduate Pediatric Dentistry at [phone]. Children aged 21-14, please call [phone] to request a pediatric application.  Dental services are provided in all areas of dental care including fillings, crowns and bridges, complete and partial dentures, implants, gum treatment, root canals, and extractions. Preventive care is also provided.  Treatment is provided to both adults and children. Patients are selected via a lottery and there is often a waiting list.   [facility] [address], [place]  [phone] [email or web address]   [facility] [address] [place], [place] [phone], Ext. [phone] Second and Fourth Thursday of each month, opens at 6:30 AM; Clinic ends at 9 AM.  Patients are seen on a first-come first-served basis, and a limited number are seen during each clinic.   [facility]  [address] Ether Griffins [place], [place] [phone]   Eligibility Requirements You must have lived in [place], [place], or [place] counties for at least the last three months.   You cannot be eligible for state or federal sponsored [place], including [employer], [place], or [employer].   You generally cannot be eligible for healthcare insurance through your employer.    How to apply: Eligibility screenings are held every Tuesday and Wednesday afternoon from 1:00 pm until 4:00 pm. You do not need an appointment for the interview!  [facility] [address], [place], [place] [phone]   [facility] Health Department  [phone]   [facility] Health Department  [phone]   [facility] Health Department  [phone]    Behavioral Health Resources in the Community: Intensive Outpatient Programs Organization         Address  Phone  Notes  [facility] [address], [place], [place]  [phone]   [facility] [address], [place], [place] [phone]   [facility] [address], [place], [place]  [phone]   [facility] [address],  [place], [place] [phone] or [phone]   Substance Abuse Resources Organization         Address  Phone  Notes  [facility]  [phone]   [facility]   [phone]   [facility]  [phone]   [facility]  [phone]   Residential & Outpatient Substance Abuse Program  [phone]   Psychological Services Organization         Address  Phone  Notes  [facility]  [phone]- [phone]   [facility]  [phone]- [phone]   [facility] [address], [place] [phone] or [phone]    Mobile Crisis Teams Organization         Address  Phone  Notes  [facility], Mobile Crisis Care Unit  [phone]   [facility]  [address] [place], [place] [phone]   Doristine LocksSharon DeEsch [address] [place] [phone]    Self-Help/Support Groups Organization         Address  Phone             Notes  [facility] [place] - variety of support groups  336- [phone] Call for more information  Narcotics Anonymous (NA), [facility] [address], [place]  2 meetings at this location   Statisticianesidential Treatment Programs Organization         Address  Phone  Notes  [facility] [address],    [place]  [phone]   [facility]  [address], [place] [phone], [place], [place] [phone]   [facility] [address] [place] [address], [place] [phone] Admissions: 8am-3pm M-F  [facility] [address]    [place], [place] [phone]   [facility] [address], [place], [place] [phone]   [facility] [address]  [place], [place] [phone]   [facility] - Intensive Outpatient [address], [place], [place] [phone]   [facility] ([facility] [address]  [place], [place] [phone] or [phone]   [facility] ([facility]) [address] [place], [place] [phone] Accepts Medicaid  [facility] [address]  [place] [phone] Substance  Abuse/Addiction Treatment   [facility] Resources Organization         Address  Phone  Notes  [facility]  [phone]   Angie FavaJulie Brannon, PhD [address] [place], [place]   [phone] or [phone]   [facility]   [address] [place], [place] [phone]   [facility] [address], [place], [place] [phone] Insurance/Medicaid/sponsorship through [facility] [address] [place] [address]                                    [place], [place] [phone] Therapy/tele-psych/case  [facility] [address]   [place], [place] [phone]    Dr. Lolly MustacheArfeen  [phone]   [facility] [place]  [employer] [facility] 1) [address], [place] 2) [address] [facility]  [address], [place] °3)  [address], [place] [phone] °([phone] ° °([phone]   °[place] Child Abuse Hotline (336) [phone] or ([phone] (After Hours)    ° ° ° °

## 2014-02-16 NOTE — ED Provider Notes (Signed)
CSN: [id number]     Arrival date & time 02/16/14  0725 History  This chart was scribed for Tony Octave, MD by Tonye Royalty, ED Scribe. This patient was seen in room APA09/APA09 and the patient's care was started at 7:41 AM.    Chief Complaint  Patient presents with  . Rectal Bleeding   The history is provided by the patient. No language interpreter was used.   HPI Comments: Tony Reynolds is a 43 y.o. male who presents to the Emergency Department requesting to have his prostate checked. He states that people have been telling him that he should do so because of his age. He reports noticing seeing some red streaks upon wiping after bowel movements "once in a blue moon" in the past 5 months, the last of which was 2 weeks ago. He states there is no particular reasons he came in today rather than another day; he states he does not have a PCP. He states he is making an appointment today with Dr. Loleta Chance in [place]. He states his cousin has prostate cancer but denies other familial history of it. He reports chronic back pain. He reports moderate alcohol use. He denies dysuria, difficulty urinating, abdominal pain, hematuria, chest pain, dizziness, lightheadedness, melena, or appetite loss.  Past Medical History  Diagnosis Date  . Eczema   . Back pain    History reviewed. No pertinent past surgical history. No family history on file. History  Substance Use Topics  . Smoking status: Current Every Day Smoker -- 0.50 packs/day    Types: Cigarettes  . Smokeless tobacco: Not on file  . Alcohol Use: Yes     Comment: occ    Review of Systems  Constitutional: Negative for appetite change.  Cardiovascular: Negative for chest pain.  Gastrointestinal: Positive for blood in stool. Negative for abdominal pain.  Genitourinary: Negative for dysuria, hematuria and difficulty urinating.  Musculoskeletal: Positive for back pain (chronic).  Neurological: Negative for dizziness and light-headedness.  All  other systems reviewed and are negative. A complete 10 system review of systems was obtained and all systems are negative except as noted in the HPI and PMH.    Allergies  Tomato  Home Medications   Prior to Admission medications   Medication Sig Start Date End Date Taking? Authorizing Provider  doxycycline (VIBRAMYCIN) 100 MG capsule Take 1 capsule (100 mg total) by mouth 2 (two) times daily. For 10 days 10/21/13   Tammy L. Triplett, PA-C  HYDROcodone-acetaminophen (NORCO) 7.5-325 MG per tablet Take 1 tablet by mouth every 6 (six) hours as needed for moderate pain. 10/21/13   Tammy L. Triplett, PA-C  ibuprofen (ADVIL,MOTRIN) 400 MG tablet Take 400 mg by mouth every 6 (six) hours as needed.    Historical Provider, MD  oxyCODONE-acetaminophen (PERCOCET/ROXICET) 5-325 MG per tablet Take 1 tablet by mouth every 4 (four) hours as needed. 12/30/13   Dione Booze, MD  predniSONE (DELTASONE) 50 MG tablet Take 1 tablet (50 mg total) by mouth daily. 12/30/13   Dione Booze, MD  triamcinolone cream (KENALOG) 0.1 % Apply 1 application topically 3 (three) times daily. 10/21/13   Tammy L. Triplett, PA-C   BP 160/103  Pulse 71  Temp(Src) 98 F (36.7 C) (Oral)  Resp 16  SpO2 98% Physical Exam  Nursing note and vitals reviewed. Constitutional: He is oriented to person, place, and time. He appears well-developed and well-nourished. No distress.  HENT:  Head: Normocephalic and atraumatic.  Mouth/Throat: Oropharynx is clear and moist. No  oropharyngeal exudate.  Eyes: Conjunctivae and EOM are normal. Pupils are equal, round, and reactive to light.  Neck: Normal range of motion. Neck supple.  No meningismus.  Cardiovascular: Normal rate, regular rhythm, normal heart sounds and intact distal pulses.   No murmur heard. Pulmonary/Chest: Effort normal and breath sounds normal. No respiratory distress.  Abdominal: Soft. There is no tenderness. There is no rebound and no guarding.  Genitourinary:  No hemorrhoids,  firm prostate, no nodules, hemacult negative  Musculoskeletal: Normal range of motion. He exhibits no edema and no tenderness.  Neurological: He is alert and oriented to person, place, and time. No cranial nerve deficit. He exhibits normal muscle tone. Coordination normal.  No ataxia on finger to nose bilaterally. No pronator drift. 5/5 strength throughout. CN 2-12 intact. Negative Romberg. Equal grip strength. Sensation intact. Gait is normal.   Skin: Skin is warm.  Psychiatric: He has a normal mood and affect. His behavior is normal.    ED Course  Procedures (including critical care time) Labs Review Labs Reviewed  URINALYSIS, ROUTINE W REFLEX MICROSCOPIC  POC OCCULT BLOOD, ED  I-STAT CHEM 8, ED    Imaging Review No results found.   EKG Interpretation None     DIAGNOSTIC STUDIES: Oxygen Saturation is 98% on room air, normal by my interpretation.    COORDINATION OF CARE: 7:49 AM Discussed treatment plan with patient at beside, the patient agrees with the plan and has no further questions at this time.   MDM   Final diagnoses:  Well adult exam  Essential hypertension   patient says he is here for "prostate checked." He denies any symptoms. Denies any abdominal pain, vomiting, difficulty with urination or blood in the urine. Admits to having intermittent bright red blood with bowel movements after straining over the past 5 months  Exam is normal. Prostate is firm and nontender. Hemoccult negative. Hemoglobin 15. UA negative.  Informed patient that prostate problems cannot be ruled out in the ED. He needs PCP followup. He says his wife is scheduling an appointment with her doctor. He also needs his BP addressed as it is somewhat elevated today.  Referrals and resource guide given.  Return precautions discussed.  I personally performed the services described in this documentation, which was scribed in my presence. The recorded information has been reviewed and is  accurate.   Tony OctaveStephen Taronda Comacho, MD 02/16/14 [phone]

## 2014-07-29 ENCOUNTER — Emergency Department (HOSPITAL_COMMUNITY)
Admission: EM | Admit: 2014-07-29 | Discharge: 2014-07-29 | Disposition: A | Discharge status: Home / Self Care | Payer: 59 | Attending: Emergency Medicine | Admitting: Emergency Medicine

## 2014-07-29 ENCOUNTER — Encounter (HOSPITAL_COMMUNITY): Payer: Self-pay | Admitting: *Deleted

## 2014-07-29 DIAGNOSIS — Z872 Personal history of diseases of the skin and subcutaneous tissue: Secondary | ICD-10-CM | POA: Insufficient documentation

## 2014-07-29 DIAGNOSIS — Z720 Tobacco use: Secondary | ICD-10-CM | POA: Diagnosis not present

## 2014-07-29 DIAGNOSIS — H578 Other specified disorders of eye and adnexa: Secondary | ICD-10-CM | POA: Diagnosis present

## 2014-07-29 DIAGNOSIS — H109 Unspecified conjunctivitis: Secondary | ICD-10-CM | POA: Insufficient documentation

## 2014-07-29 MED ORDER — DIPHENHYDRAMINE HCL 25 MG PO CAPS
25.0000 mg | ORAL_CAPSULE | Freq: Once | ORAL | Status: AC
  Administered 2014-07-29: 25 mg via ORAL
  Filled 2014-07-29: qty 1

## 2014-07-29 MED ORDER — TETRACAINE HCL 0.5 % OP SOLN
2.0000 [drp] | Freq: Once | OPHTHALMIC | Status: AC
  Administered 2014-07-29: 2 [drp] via OPHTHALMIC
  Filled 2014-07-29: qty 2

## 2014-07-29 MED ORDER — KETOROLAC TROMETHAMINE 0.5 % OP SOLN
1.0000 [drp] | Freq: Once | OPHTHALMIC | Status: AC
  Administered 2014-07-29: 1 [drp] via OPHTHALMIC
  Filled 2014-07-29: qty 5

## 2014-07-29 MED ORDER — TOBRAMYCIN 0.3 % OP SOLN
1.0000 [drp] | Freq: Once | OPHTHALMIC | Status: AC
  Administered 2014-07-29: 1 [drp] via OPHTHALMIC
  Filled 2014-07-29: qty 5

## 2014-07-29 MED ORDER — FLUORESCEIN SODIUM 1 MG OP STRP
1.0000 | ORAL_STRIP | Freq: Once | OPHTHALMIC | Status: AC
  Administered 2014-07-29: 1 via OPHTHALMIC
  Filled 2014-07-29: qty 1

## 2014-07-29 NOTE — ED Notes (Signed)
Pt with right eye swelling and itching, pt states hx of same due to allergies, pt states that he was outside today when it occurred

## 2014-07-29 NOTE — Discharge Instructions (Signed)

## 2014-07-31 NOTE — ED Provider Notes (Signed)
CSN: [id number]     Arrival date & time 07/29/14  1312 History   First MD Initiated Contact with Patient 07/29/14 1320     Chief Complaint  Patient presents with  . Eye Problem     (Consider location/radiation/quality/duration/timing/severity/associated sxs/prior Treatment) HPI   Tony Reynolds is a 44 y.o. male who presents to the Emergency Department complaining of intense itching, right eye and eyelid swelling and photophobia of both eyes that began earlier on the day of arrival.  He reports noticing symptoms in the right eye initially and now having similar symptoms to the left eye as well.  Patient's wife states that he has been rubbing his right eye excessively since onset of symptoms.  Patient's wife has been applying visine without relief.  patient denies headaches, dizziness, vomiting, foreign body sensation or contact use.   Past Medical History  Diagnosis Date  . Eczema   . Back pain    History reviewed. No pertinent past surgical history. History reviewed. No pertinent family history. History  Substance Use Topics  . Smoking status: Current Every Day Smoker -- 0.50 packs/day    Types: Cigarettes  . Smokeless tobacco: Not on file  . Alcohol Use: Yes     Comment: occ    Review of Systems  Constitutional: Negative for fever, activity change and appetite change.  HENT: Negative for congestion.   Eyes: Positive for photophobia, discharge and itching. Negative for pain and redness.  Respiratory: Negative for cough.   Neurological: Negative for dizziness, facial asymmetry, numbness and headaches.  All other systems reviewed and are negative.     Allergies  Tomato  Home Medications   Prior to Admission medications   Medication Sig Start Date End Date Taking? Authorizing Provider  doxycycline (VIBRAMYCIN) 100 MG capsule Take 1 capsule (100 mg total) by mouth 2 (two) times daily. For 10 days Patient not taking: Reported on 07/29/2014 10/21/13   Severiano Gilbert, PA-C   HYDROcodone-acetaminophen (NORCO) 7.5-325 MG per tablet Take 1 tablet by mouth every 6 (six) hours as needed for moderate pain. Patient not taking: Reported on 07/29/2014 10/21/13   Severiano Gilbert, PA-C  oxyCODONE-acetaminophen (PERCOCET/ROXICET) 5-325 MG per tablet Take 1 tablet by mouth every 4 (four) hours as needed. Patient not taking: Reported on 07/29/2014 12/30/13   Dione Booze, MD  predniSONE (DELTASONE) 50 MG tablet Take 1 tablet (50 mg total) by mouth daily. Patient not taking: Reported on 07/29/2014 12/30/13   Dione Booze, MD  triamcinolone cream (KENALOG) 0.1 % Apply 1 application topically 3 (three) times daily. Patient not taking: Reported on 07/29/2014 10/21/13   Tammi Shaquel Josephson, PA-C   BP 174/104 mmHg  Pulse 73  Temp(Src) 99 F (37.2 C) (Oral)  Resp 16  SpO2 99% Physical Exam  Constitutional: He is oriented to person, place, and time. He appears well-developed and well-nourished. No distress.  HENT:  Head: Normocephalic and atraumatic.  Mouth/Throat: Oropharynx is clear and moist.  Eyes: EOM are normal. Lids are everted and swept, no foreign bodies found. Right eye exhibits chemosis and discharge. Right eye exhibits no exudate and no hordeolum. No foreign body present in the right eye. Left eye exhibits no chemosis, no discharge and no hordeolum. No foreign body present in the left eye. Right conjunctiva is injected. Left conjunctiva is injected. Left eye exhibits normal extraocular motion and no nystagmus. Right pupil is reactive.  Moderate chemosis of right conjunctiva.  Mild exudate present.  Neck: Normal range of motion. Neck supple.  Cardiovascular:  Normal rate and regular rhythm.   Pulmonary/Chest: Effort normal. No respiratory distress.  Musculoskeletal: Normal range of motion.  Neurological: He is alert and oriented to person, place, and time. Coordination normal.  Psychiatric: He has a normal mood and affect.    ED Course  Procedures (including critical care time) Labs  Review Labs Reviewed - No data to display  Imaging Review No results found.   EKG Interpretation None      MDM   Final diagnoses:  Bilateral conjunctivitis    Patient is well appearing, vitals stable.  Sx's likely related to allergic vs viral conjunctivitis.  Chemosis of the right eye due to trauma of patient rubbing his eye.  He agrees to cool compresses, benadryl and close ophthalmology f/u if needed.  Pt appears stable for d/c, dispensed acular and tobramycin   Severiano Gilbert, PA-C 07/31/14 0818  Donnetta Hutching, MD 08/03/14 1540

## 2014-11-25 ENCOUNTER — Emergency Department (HOSPITAL_COMMUNITY)
Admission: EM | Admit: 2014-11-25 | Discharge: 2014-11-25 | Disposition: A | Discharge status: Home / Self Care | Payer: 59 | Attending: Emergency Medicine | Admitting: Emergency Medicine

## 2014-11-25 ENCOUNTER — Encounter (HOSPITAL_COMMUNITY): Payer: Self-pay | Admitting: *Deleted

## 2014-11-25 DIAGNOSIS — Z872 Personal history of diseases of the skin and subcutaneous tissue: Secondary | ICD-10-CM | POA: Insufficient documentation

## 2014-11-25 DIAGNOSIS — I10 Essential (primary) hypertension: Secondary | ICD-10-CM | POA: Diagnosis not present

## 2014-11-25 DIAGNOSIS — G8929 Other chronic pain: Secondary | ICD-10-CM | POA: Insufficient documentation

## 2014-11-25 DIAGNOSIS — Z720 Tobacco use: Secondary | ICD-10-CM | POA: Insufficient documentation

## 2014-11-25 DIAGNOSIS — R109 Unspecified abdominal pain: Secondary | ICD-10-CM | POA: Diagnosis not present

## 2014-11-25 DIAGNOSIS — M79604 Pain in right leg: Secondary | ICD-10-CM | POA: Insufficient documentation

## 2014-11-25 HISTORY — DX: Essential (primary) hypertension: I10

## 2014-11-25 MED ORDER — IBUPROFEN 800 MG PO TABS
800.0000 mg | ORAL_TABLET | Freq: Once | ORAL | Status: AC
  Administered 2014-11-25: 800 mg via ORAL
  Filled 2014-11-25: qty 1

## 2014-11-25 NOTE — ED Provider Notes (Signed)
CSN: [id number]     Arrival date & time 11/25/14  0509 History   First MD Initiated Contact with Patient 11/25/14 [phone]     Chief Complaint  Patient presents with  . Leg Pain     Patient is a 44 y.o. male presenting with leg pain. The history is provided by the patient.  Leg Pain Location:  Leg Injury: no   Leg location:  R upper leg and R lower leg Pain details:    Quality:  Aching   Radiates to:  Does not radiate   Severity:  Moderate   Onset quality:  Gradual   Duration: "years"   Timing:  Constant   Progression:  Unchanged Chronicity:  Chronic Relieved by:  Nothing Worsened by:  Activity Associated symptoms: back pain   Associated symptoms: no fever and no muscle weakness   Associated symptoms comment:  Chronic back pain  patient reports chronic right upper/lower leg pain for "years" Denies trauma Reports chronic back pain but feels leg pain is separate from back pain He can ambulate  Pt admits he has had chronic daily pain for years.  Reports he saw chiropractor earlier in the spring and he had xrays of leg performed and told it is "bone on bone"  When asked if anything worsened to trigger ER visit for chronic daily right leg pain he reports "it just hurts"   Past Medical History  Diagnosis Date  . Eczema   . Back pain   . Hypertension    History reviewed. No pertinent past surgical history. History reviewed. No pertinent family history. History  Substance Use Topics  . Smoking status: Current Every Day Smoker -- 0.50 packs/day    Types: Cigarettes  . Smokeless tobacco: Not on file  . Alcohol Use: Yes     Comment: occ    Review of Systems  Constitutional: Negative for fever.  Cardiovascular: Negative for chest pain.  Gastrointestinal: Positive for abdominal pain.  Genitourinary: Negative for difficulty urinating.  Musculoskeletal: Positive for back pain and arthralgias.  Neurological: Negative for weakness.  All other systems reviewed and are  negative.     Allergies  Tomato  Home Medications   Prior to Admission medications   Not on File   BP 144/109 mmHg  Pulse 74  Temp(Src) 98.5 F (36.9 C) (Oral)  Resp 18  Ht 5\' 6"  (1.676 m)  Wt 180 lb (81.647 kg)  BMI 29.07 kg/m2  SpO2 99% Physical Exam CONSTITUTIONAL: Well developed/well nourished HEAD: Normocephalic/atraumatic EYES: EOMI ENMT: Mucous membranes moist NECK: supple no meningeal signs SPINE/BACK:entire spine nontender  CV: S1/S2 noted, no murmurs/rubs/gallops noted LUNGS: Lungs are clear to auscultation bilaterally, no apparent distress ABDOMEN: soft, nontender, no rebound or guarding NEURO: Awake/alert, equal motor 5/5 strength noted with the following:  knee flexion/extension, foot dorsi/plantar flexion, great toe extension intact bilaterally,Pt is able to ambulate unassisted. Right hip flexion is mildly limited due to pain EXTREMITIES: pulses normal, full ROM. Tenderness to palpation of right thigh.  No erythema/warmth/bruising noted to right lower extremity.  He can flex/extend right knee without difficulty.  There is no calf tenderness/edema.  Distal pulses equal/intact.  There is no deformity to right LE SKIN: warm, color normal PSYCH: no abnormalities of mood noted, alert and oriented to situation   ED Course  Procedures  Pt with chronic daily pain for years in his right leg Admits no acute changes today I don't feel he requires emergent imaging/workup at this time He can ambulate Will d/c  home with close ortho followup Advised use of NSAIDs for pain.  Medications  ibuprofen (ADVIL,MOTRIN) tablet 800 mg (800 mg Oral Given 11/25/14 0537)    MDM   Final diagnoses:  Right leg pain    Nursing notes including past medical history and social history reviewed and considered in documentation     Zadie Rhine, MD 11/25/14 [phone]

## 2014-11-25 NOTE — ED Notes (Addendum)
Pt reporting pain in right leg.  States pain begins in his hip and shoots down leg.  Reports most pain is around knee.  States symptoms have been going on "for a long time".   Pt reports not having PCP to follow up with regarding joint pain or HTN.

## 2015-07-04 ENCOUNTER — Encounter: Payer: Self-pay | Admitting: Orthopaedic Surgery

## 2015-07-04 ENCOUNTER — Ambulatory Visit (INDEPENDENT_AMBULATORY_CARE_PROVIDER_SITE_OTHER): Payer: 59

## 2015-07-04 ENCOUNTER — Ambulatory Visit (INDEPENDENT_AMBULATORY_CARE_PROVIDER_SITE_OTHER): Payer: 59 | Admitting: Orthopaedic Surgery

## 2015-07-04 VITALS — BP 160/107 | HR 75 | Temp 98.2°F | Resp 16 | Ht 66.0 in | Wt 171.0 lb

## 2015-07-04 DIAGNOSIS — M25551 Pain in right hip: Secondary | ICD-10-CM | POA: Diagnosis not present

## 2015-07-04 DIAGNOSIS — M87052 Idiopathic aseptic necrosis of left femur: Secondary | ICD-10-CM | POA: Insufficient documentation

## 2015-07-04 DIAGNOSIS — M87051 Idiopathic aseptic necrosis of right femur: Secondary | ICD-10-CM | POA: Insufficient documentation

## 2015-07-04 MED ORDER — NAPROXEN 500 MG PO TABS: 500.0000 mg | ORAL_TABLET | Freq: Two times a day (BID) | ORAL | Status: DC

## 2015-07-04 MED ORDER — HYDROCODONE-ACETAMINOPHEN 5-325 MG PO TABS: 1.0000 | ORAL_TABLET | ORAL | Status: DC | PRN

## 2015-07-04 NOTE — Patient Instructions (Addendum)
NEEDS AN APPOINTMENT WITH DR. HARRISON TO DISCUSS SURGERY   Smoking Cessation, Tips for Success If you are ready to quit smoking, congratulations! You have chosen to help yourself be healthier. Cigarettes bring nicotine, tar, carbon monoxide, and other irritants into your body. Your lungs, heart, and blood vessels will be able to work better without these poisons. There are many different ways to quit smoking. Nicotine gum, nicotine patches, a nicotine inhaler, or nicotine nasal spray can help with physical craving. Hypnosis, support groups, and medicines help break the habit of smoking. WHAT THINGS CAN I DO TO MAKE QUITTING EASIER?  Here are some tips to help you quit for good:  Pick a date when you will quit smoking completely. Tell all of your friends and family about your plan to quit on that date.  Do not try to slowly cut down on the number of cigarettes you are smoking. Pick a quit date and quit smoking completely starting on that day.  Throw away all cigarettes.   Clean and remove all ashtrays from your home, work, and car.  On a card, write down your reasons for quitting. Carry the card with you and read it when you get the urge to smoke.  Cleanse your body of nicotine. Drink enough water and fluids to keep your urine clear or pale yellow. Do this after quitting to flush the nicotine from your body.  Learn to predict your moods. Do not let a bad situation be your excuse to have a cigarette. Some situations in your life might tempt you into wanting a cigarette.  Never have "just one" cigarette. It leads to wanting another and another. Remind yourself of your decision to quit.  Change habits associated with smoking. If you smoked while driving or when feeling stressed, try other activities to replace smoking. Stand up when drinking your coffee. Brush your teeth after eating. Sit in a different chair when you read the paper. Avoid alcohol while trying to quit, and try to drink fewer  caffeinated beverages. Alcohol and caffeine may urge you to smoke.  Avoid foods and drinks that can trigger a desire to smoke, such as sugary or spicy foods and alcohol.  Ask people who smoke not to smoke around you.  Have something planned to do right after eating or having a cup of coffee. For example, plan to take a walk or exercise.  Try a relaxation exercise to calm you down and decrease your stress. Remember, you may be tense and nervous for the first 2 weeks after you quit, but this will pass.  Find new activities to keep your hands busy. Play with a pen, coin, or rubber band. Doodle or draw things on paper.  Brush your teeth right after eating. This will help cut down on the craving for the taste of tobacco after meals. You can also try mouthwash.   Use oral substitutes in place of cigarettes. Try using lemon drops, carrots, cinnamon sticks, or chewing gum. Keep them handy so they are available when you have the urge to smoke.  When you have the urge to smoke, try deep breathing.  Designate your home as a nonsmoking area.  If you are a heavy smoker, ask your health care provider about a prescription for nicotine chewing gum. It can ease your withdrawal from nicotine.  Reward yourself. Set aside the cigarette money you save and buy yourself something nice.  Look for support from others. Join a support group or smoking cessation program. Ask someone  at home or at work to help you with your plan to quit smoking.  Always ask yourself, "Do I need this cigarette or is this just a reflex?" Tell yourself, "Today, I choose not to smoke," or "I do not want to smoke." You are reminding yourself of your decision to quit.  Do not replace cigarette smoking with electronic cigarettes (commonly called e-cigarettes). The safety of e-cigarettes is unknown, and some may contain harmful chemicals.  If you relapse, do not give up! Plan ahead and think about what you will do the next time you get  the urge to smoke. HOW WILL I FEEL WHEN I QUIT SMOKING? You may have symptoms of withdrawal because your body is used to nicotine (the addictive substance in cigarettes). You may crave cigarettes, be irritable, feel very hungry, cough often, get headaches, or have difficulty concentrating. The withdrawal symptoms are only temporary. They are strongest when you first quit but will go away within 10-14 days. When withdrawal symptoms occur, stay in control. Think about your reasons for quitting. Remind yourself that these are signs that your body is healing and getting used to being without cigarettes. Remember that withdrawal symptoms are easier to treat than the major diseases that smoking can cause.  Even after the withdrawal is over, expect periodic urges to smoke. However, these cravings are generally short lived and will go away whether you smoke or not. Do not smoke! WHAT RESOURCES ARE AVAILABLE TO HELP ME QUIT SMOKING? Your health care provider can direct you to community resources or hospitals for support, which may include:  Group support.  Education.  Hypnosis.  Therapy.   This information is not intended to replace advice given to you by your health care provider. Make sure you discuss any questions you have with your health care provider.   Document Released: 02/01/2004 Document Revised: 05/26/2014 Document Reviewed: 10/21/2012 Elsevier Interactive Patient Education [employer].

## 2015-07-04 NOTE — Progress Notes (Signed)
Patient Tony Reynolds, male DOB:08-15-1970, 45 y.o. HEN:[id number]  Chief Complaint  Patient presents with  . Hip Pain    bilateral hip pain and bilateral knee pain     HPI  Tony Reynolds is a 45 y.o. male who presents with right hip pain for about two years.  He has no trauma.  He has no history of kidney disease, excessive alcohol use, being on steroids or any major injury to the hip.  He has some pain of the left hip now as well.  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  He has more pain in the hip this winter.  He was seen by chiropractor a year ago and was told his right hip is wearing out.  He has some right knee pain as well, more on days that his hip is hurting.  He has no giving way or swelling of the right knee.  He has no trauma of the right knee.   Hip Pain  There was no injury mechanism. The pain is present in the right hip. The quality of the pain is described as aching and cramping. The pain is at a severity of 5/10. The pain is moderate. The pain has been worsening since onset. Associated symptoms include an inability to bear weight and a loss of motion. Pertinent negatives include no loss of sensation, numbness or tingling. The symptoms are aggravated by movement and weight bearing. He has tried acetaminophen, heat, ice, non-weight bearing, NSAIDs and rest for the symptoms. The treatment provided no relief.    Body mass index is 27.61 kg/(m^2).  Review of Systems  Patient does not have Diabetes Mellitus. Patient has hypertension. Patient does not have COPD or shortness of breath. Patient does not have BMI > 35. Patient has current smoking history.  Review of Systems  Eyes: Positive for redness (at times secondary to allergies).  Cardiovascular:       Hypertension  Musculoskeletal: Positive for back pain, arthralgias (both hips and right knee pains.) and gait problem (right hip).  Allergic/Immunologic: Positive for environmental allergies (seasonal  allergies).  Neurological: Negative for tingling and numbness.  All other systems reviewed and are negative.   Past Medical History  Diagnosis Date  . Eczema   . Back pain   . Hypertension     No past surgical history on file.  No family history on file.  Social History Social History  Substance Use Topics  . Smoking status: Current Every Day Smoker -- 0.50 packs/day    Types: Cigarettes  . Smokeless tobacco: None  . Alcohol Use: Yes     Comment: occ    Allergies  Allergen Reactions  . Tomato     No acidic foods    Current Outpatient Prescriptions  Medication Sig Dispense Refill  . HYDROcodone-acetaminophen (NORCO/VICODIN) 5-325 MG tablet Take 1 tablet by mouth every 4 (four) hours as needed for moderate pain (Must last 14 days.Do not take and drive a car or use machinery.). 60 tablet 0  . naproxen (NAPROSYN) 500 MG tablet Take 1 tablet (500 mg total) by mouth 2 (two) times daily with a meal. 60 tablet 5   No current facility-administered medications for this visit.     Physical Exam  Blood pressure 160/107, pulse 75, temperature 98.2 F (36.8 C), resp. rate 16, height '5\' 6"'  (1.676 m), weight 171 lb (77.565 kg).  Constitutional: overall normal hygiene, normal nutrition, well developed, normal grooming, normal body habitus. Assistive device:none  Musculoskeletal: gait and station Limp right, muscle tone and strength are normal, no tremors or atrophy is present.  .  Neurological: coordination overall normal.  Deep tendon reflex/nerve stretch intact.  Sensation normal.  Cranial nerves II-XII intact.   Skin:normal overall no scars, lesions, ulcers or rash es. No psoriasis.  Psychiatric: Alert and oriented x 3.  Recent memory intact, remote memory unclear.  Normal mood and affect. Well groomed.  Good eye contact.  Cardiovascular: overall no swelling, no varicosities, no edema bilaterally, normal temperatures of the legs and arms, no clubbing, cyanosis and good  capillary refill.  Lymphatic: palpation is normal.   Extremities:right hip painful.  He has limp to the right.  He has no redness.  Inspection normal Strength and tone normal Range of motion right hip flexes to 90, extend 5, internal 5, external 5, abduct 20, adduct 15; left hip internal 15, external 10, flexion 100, extend 10, adduct 30, abduct 30.   NV is intact.  Additional services performed: reviewed x-rays from the chiropractor and reviewed notes.  X-rays were done in January 2016.  I showed him a model of the hip and explained how a total hip is done and used model of total hip as well.  I reviewed his current x-rays of the hip done today and showed him the films and explained the findings to him.  I explained a total hip procedure to him and went over some risks and imponderables.  He will need to cut back on smoking or stop to get best results from total hip surgery.  PLAN Call if any problems.  Precautions discussed.  Continue current medications.   Return to clinic To see Dr. Aline Brochure for a total hip procedure consideration.  Dr. Aline Brochure happened to be in office and met patient briefly.

## 2015-07-13 ENCOUNTER — Ambulatory Visit (INDEPENDENT_AMBULATORY_CARE_PROVIDER_SITE_OTHER): Payer: 59 | Admitting: Orthopedic Surgery

## 2015-07-13 ENCOUNTER — Telehealth: Payer: Self-pay | Admitting: Orthopedic Surgery

## 2015-07-13 ENCOUNTER — Encounter: Payer: Self-pay | Admitting: Orthopedic Surgery

## 2015-07-13 VITALS — BP 180/116 | Ht 66.0 in | Wt 171.0 lb

## 2015-07-13 DIAGNOSIS — M87051 Idiopathic aseptic necrosis of right femur: Secondary | ICD-10-CM | POA: Diagnosis not present

## 2015-07-13 DIAGNOSIS — M5136 Other intervertebral disc degeneration, lumbar region: Secondary | ICD-10-CM | POA: Diagnosis not present

## 2015-07-13 NOTE — Telephone Encounter (Addendum)
Regarding in-patient/admit surgery scheduled at [facility], 07/26/15, CPT 27130, ICD-10 M87.051, M25.551, M87.052, contacted insurer [employer] via online portal. Authorization in progress * REFERENCE/Pre-authorization #[id number]  - verified received online and per Marquis Lunch 07/16/15, 12:59p.m; status pending; nurse reviewer to contact our office if additional clinicals are needed.

## 2015-07-13 NOTE — Progress Notes (Signed)
Chief Complaint  Patient presents with  . Follow-up    DISCUSS RT THA, REFERRAL FROM DR Hilda Lias   HPI: Tony Reynolds is a 45 y.o. male who presents with right hip pain for about two years. He has no trauma. He has no history of kidney disease, excessive alcohol use, being on steroids or any major injury to the hip. He has some pain of the left hip now as well. 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 He has more pain in the hip this winter. He was seen by chiropractor a year ago and was told his right hip is wearing out.  He has some right knee pain as well, more on days that his hip is hurting. He has no giving way or swelling of the right knee. He has no trauma of the right knee.  The history I get from the patient is similar to what Dr. Hilda Lias got  He does note pain in his right hip that radiates down to his right foot and I looked at his MRI and he does have some disc disease. 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 Eczema   . Back pain   . Hypertension    He denies any previous surgery  No family history reported  Social History  Substance Use Topics  . Smoking status: Current Every Day Smoker -- 0.50 packs/day    Types: Cigarettes  . Smokeless tobacco: None  . Alcohol Use: Yes     Comment: occ    BP 180/116 mmHg  Ht  (1.676 m)  Wt 171 lb (77.565 kg)  BMI 27.61 kg/m2 Gen. appearance the patient is thin well-developed well-nourished ectomorphic body habitus no gross deformities and his grooming is excellent.  Peripheral vascular system. We observe no swelling or varicose veins and palpation of pulses temperature is normal without edema or tenderness  Lymph nodes in the groin area is negative  His gait and station show a moderate limp favoring  the right side he does have a short right leg by 5 mm  On inspection of the upper extremities we find normal alignment without asymmetry of crepitation defects tenderness masses or effusions. Range of motion is normal without contracture and stability shows no luxation or subluxation of the joint and the muscle strength and tone is normal without tremor or atrophy skin is warm dry and intact good coordination is noted between the extremities and reflexes are normal with normal sensation.  He is oriented to time person place mood and affect are normal  Right leg again is shorter than the left he does have painful internal rotation of the hip is flexion is painful on the right side he has no real limits in range of motion they are mild his hip is stable he has some weakness of hip flexion on the right  The left side inspection is normal range of motion is normal stability is normal and strength and muscle tone are normal  We should note that flexion internal rotation and internal rotation and extension cause groin and thigh pain. And he had a negative straight leg raise on the right.  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 Specifically we also noted he will need another procedure in his lifetime we discussed the different bearing surfaces and decided to go with ceramic on polyethylene.    The MRI below all shows shows that he has some disc disease and I discussed specifically that his radicular symptoms will not be eliminated by this replacement but we should be able to get rid of his thigh pain and groin pain   MRI LUMBAR SPINE WITHOUT CONTRAST   Technique:  Multiplanar and multiecho pulse sequences of the lumbar spine were obtained  without intravenous contrast.   Comparison: Lumbar spine radiographs 05/13/2012.   Findings: Normal signal is present in the conus medullaris which terminates at T12, within normal limits.  Marrow signal, vertebral body heights, and alignment are normal.  There is some straightening of the normal lumbar lordosis.  This is stable. Limited imaging of the abdomen is unremarkable.   The disc levels at L2-3 and above are normal.   L3-4:  Slight disc desiccation and mild broad-based bulge are present.  There is no significant stenosis.   L4-5:  Mild broad-based disc herniation is present.  There is slight extension into the left foramen.  The central canal and right foramen are patent.   L5-S1:  No significant disc herniation or stenosis is present.   IMPRESSION:   1.  Minimal left foraminal narrowing at L4-5. 2.  Mild broad-based disc herniation at L4-5 without significant stenosis otherwise. 3.  Slight desiccation and mild broad-based disc bulge at L3-4 without significant stenosis. 4.  Straightening of the normal lumbar lordosis.  No soft tissue injury is evident.  This can be seen setting of muscle strain or ongoing pain.     Original Report Authenticated By: Marin Roberts, M.D.

## 2015-07-13 NOTE — Patient Instructions (Addendum)
SURGERY MARCH 9, RIGHT TOTAL HIP REPLACEMENT   Total Hip Replacement Total hip replacement is a surgical procedure to remove damaged bone in your hip joint and replace it with an artificial hip joint (prosthetic hip joint). The purpose of this surgery is to reduce pain and to improve your hip function.  During a total hip replacement, one or both parts of the hip joint are replaced, depending on the type of joint damage you have. The hip is a ball-and-socket type of joint, and it has two main parts. The ball part of the joint (femoral head) is the top of the thigh bone (femur). The socket part of the joint is a large indent in the side of your pelvis (acetabulum) where the femur and pelvis meet. LET [facility] CARE PROVIDER KNOW ABOUT:  Any allergies you have.  All medicines you are taking, including vitamins, herbs, eye drops, creams, and over-the-counter medicines.  Previous problems you or members of your family have had with the use of anesthetics.  Any blood disorders you have.  Previous surgeries you have had.  Medical conditions you have. RISKS AND COMPLICATIONS  Generally, total hip replacement is a safe procedure. However, problems can occur, including:  Infection.  Dislocation (the ball of the hip-joint prosthesis comes out of contact with the socket).  Loosening of the piece (stem) that connects the prosthetic femoral head to the femur.  Fracture of the bone while inserting the prosthesis.  Formation of blood clots, which can break loose and travel to and injure your lungs (pulmonary embolus). BEFORE THE PROCEDURE   Plan to have someone take you home after the procedure.  Do not eat or drink anything after midnight on the night before the procedure or as directed by your health care provider.  Ask your health care provider about:  Changing or stopping your regular medicines. This is especially important if you are taking diabetes medicines or blood  thinners.  Taking medicines such as aspirin and ibuprofen. These medicines can thin your blood. Do not take these medicines before your procedure if your health care provider asks you not to.  Ask your health care provider about how your surgical site will be marked or identified.  You may be given antibiotic medicines to help prevent infection. PROCEDURE   To reduce your risk of infection:  Your health care team will wash or sanitize their hands.  Your skin will be washed with soap.  An IV tube will be inserted into one of your veins. You will be given one or more of the following:  A medicine that makes you drowsy (sedative).  A medicine that makes you fall asleep (general anesthetic).  A medicine injected into your spine that numbs your body below the waist (spinal anesthetic).  An incision will be made in your hip. Your surgeon will take out any damaged cartilage and bone.  Your surgeon will then:  Insert a prosthetic socket into the acetabulum of your pelvis. This is usually secured with screws.  Remove the femoral head and replace it with a prosthetic ball and stem secured into the top of your femur.  Place the ball into the socket and check the range of motion and stability of your new hip.  Close the incision and apply a bandage over the surgical site. AFTER THE PROCEDURE   You will stay in a recovery area until the medicines have worn off.  Your vital signs, such as your pulse and blood pressure, will be monitored.  Once you are awake and stable, you will be taken to a hospital room.  You may be directed to take actions to help prevent blood clots. These may include:  Walking soon after surgery, with someone assisting you. Moving around after surgery helps to improve blood flow.  Taking medicines to thin your blood (anticoagulants).  Wearing compression stockings or using different types of devices.  You will receive physical therapy until you are doing well  and your health care provider feels it is safe for you to go home.   This information is not intended to replace advice given to you by your health care provider. Make sure you discuss any questions you have with your health care provider.   Document Released: 08/11/2000 Document Revised: 01/24/2015 Document Reviewed: 07/06/2013 Elsevier Interactive Patient Education 2016 Else[employer] Inc. Avascular Necrosis Avascular necrosis is a disease resulting from the temporary or permanent loss of blood supply to a bone. This disease may also be known as:   Osteonecrosis.   Aseptic necrosis.   Ischemic bone necrosis. Without proper blood supply, the internal layer of the affected bone dies and the outer layer of the bone may break down. If this process affects a bone near a joint, it may lead to collapse of that joint. Common bones that are affected by this condition include:  The top of your thigh bone (femoral head).  One or more bones in your wrist (scaphoid orlunate).  One or more bones in your foot (metatarsals).  One of the bones in your ankle (navicular). The joint most commonly affected by this condition is the hip joint. Avascular necrosis rarely occurs in more than one bone at a time.  CAUSES   Damage or injury to a bone or joint.  Using corticosteroid medicine for a long period of time.  Changes in your immune or hormone systems.  Excessive exposure to radiation. RISK FACTORS  Alcohol abuse.  Previous traumatic injury to a joint.  Using corticosteroid medicines for a long period of time or often.  Having a medical condition such as:  HIV or AIDS.   Diabetes.   Sickle cell disease.  An autoimmune disease. SIGNS AND SYMPTOMS  The main symptoms of avascular necrosis are pain and decreased motion in the affected bone or joint. In the early stages the pain may be minor and occur only with activity. As avascular necrosis progresses, pain may gradually worsen and  occur while at rest. The pain may suddenly become severe if an affected joint collapses. DIAGNOSIS  Avascular necrosis may be diagnosed with:   A medical history.  A physical exam.   X-rays.  An MRI.  A bone scan. TREATMENT  Treatments may include:  Medicine to help relieve pain.  Avoiding placing any pressure or weight ontheaffected area. If avascular necrosis occurs in your hip, ankle, or foot, you may be instructed to use crutches or a rolling scooter.  Surgery, such as:   Core decompression. In this surgery, one or more holes are placed in the bone for new blood vessels to grow into. This provides a renewed blood supply to the bone. Core decompression can often reduce pain and pressure in the affected bone and slow the progression of bone and joint destruction.  Osteotomy. In this surgery, the bone is reshaped to reduce stress on the affected area of the joint.   Bone grafting. In this surgery, healthy bone from one part of your body is transplanted to the affected area.   Arthroplasty. Arthroplasty  is also known as total joint replacement. In this surgery, the affected surface on one or both sides of a joint is replaced with artificial parts (prostheses).  Electrical stimulation. This may help encourage new bone growth. HOME CARE INSTRUCTIONS  Take medicines only as directed by your health care provider.   Follow your health care provider's recommendations on limiting activities or using crutches to rest your affected joint.   Meet with aphysical therapist as directed by your health care provider.   Keep all follow-up visits as directed by your health care provider. This is important. SEEK MEDICAL CARE IF:   Your pain worsens.  You have decreased motion in your affected joint. SEEK IMMEDIATE MEDICAL CARE IF:  Your pain suddenly becomes severe.   This information is not intended to replace advice given to you by your health care provider. Make sure you  discuss any questions you have with your health care provider.   Document Released: 10/25/2001 Document Revised: 05/26/2014 Document Reviewed: 07/13/2013 Elsevier Interactive Patient Education [employer]

## 2015-07-13 NOTE — Addendum Note (Signed)
Addended by: Adella Hare B on: 07/13/2015 12:51 PM   Modules accepted: Orders

## 2015-07-17 NOTE — Telephone Encounter (Signed)
Call received today, 07/17/15, with a Denial for surgery per [employer], per voice message from [place], [place] for CPT [phone] Total hip arthroplasty, planned surgery date/admit date 07/26/15, REF# [id number].  We were not contacted for submission of clinicals, as typically requested to forward.  I called back to this phone #, and reached representative Tom L, who relays that our only option is Peer to Peer, as request has already been denied.  PH# for Peer to Peer requests: [phone].  I've called this phone # and have left a message, also offered to fax clinicals. Left message to contact our nurse, Adella Hare, LPN.

## 2015-07-18 ENCOUNTER — Telehealth: Payer: Self-pay | Admitting: *Deleted

## 2015-07-18 NOTE — Telephone Encounter (Signed)
Surgery scheduled for 07/26/15 denied per [facility]. Faxed office notes to [facility] at [phone] , and was informed a [employer] geologist will call the office 07/19/15 for a Peer to Peer with DR. Romeo Apple.  REFERENCE # [id number] (NEED TO PROVIDE THEM WITH THIS REFERENCE NUMBER)

## 2015-07-20 NOTE — Telephone Encounter (Signed)
INFORMED PATIENT THAT SURGERY WAS CANCELLED DUE TO INSURANCE DENIAL. I LEFT VOICE MESSAGE FOR SURGERY SCHEDULING AND NOTIFIED BRANDON WITH [employer].

## 2015-07-20 NOTE — Patient Instructions (Signed)
Tony Reynolds  07/20/2015     @PREFPERIOPPHARMACY @   Your procedure is scheduled on 08/05/2015.  Report to [facility] at 6:15 A.M.  Call this number if you have problems the morning of surgery:  [phone]   Remember:  Do not eat food or drink liquids after midnight.  Take these medicines the morning of surgery with A SIP OF WATER: Claritin and Vicodin   Do not wear jewelry, make-up or nail polish.  Do not wear lotions, powders, or perfumes.  You may wear deodorant.  Do not shave 48 hours prior to surgery.  Men may shave face and neck.  Do not bring valuables to the hospital.  [facility] is not responsible for any belongings or valuables.  Contacts, dentures or bridgework may not be worn into surgery.  Leave your suitcase in the car.  After surgery it may be brought to your room.  For patients admitted to the hospital, discharge time will be determined by your treatment team.  Patients discharged the day of surgery will not be allowed to drive home.   Name and phone number of your driver:   family Special instructions:  n/a  Please read over the following fact sheets that you were given. Care and Recovery After Surgery    Total Hip Replacement Total hip replacement is a surgical procedure to remove damaged bone in your hip joint and replace it with an artificial hip joint (prosthetic hip joint). The purpose of this surgery is to reduce pain and to improve your hip function.  During a total hip replacement, one or both parts of the hip joint are replaced, depending on the type of joint damage you have. The hip is a ball-and-socket type of joint, and it has two main parts. The ball part of the joint (femoral head) is the top of the thigh bone (femur). The socket part of the joint is a large indent in the side of your pelvis (acetabulum) where the femur and pelvis meet. LET [facility] CARE PROVIDER KNOW ABOUT:  Any allergies you have.  All medicines you are taking,  including vitamins, herbs, eye drops, creams, and over-the-counter medicines.  Previous problems you or members of your family have had with the use of anesthetics.  Any blood disorders you have.  Previous surgeries you have had.  Medical conditions you have. RISKS AND COMPLICATIONS  Generally, total hip replacement is a safe procedure. However, problems can occur, including:  Infection.  Dislocation (the ball of the hip-joint prosthesis comes out of contact with the socket).  Loosening of the piece (stem) that connects the prosthetic femoral head to the femur.  Fracture of the bone while inserting the prosthesis.  Formation of blood clots, which can break loose and travel to and injure your lungs (pulmonary embolus). BEFORE THE PROCEDURE   Plan to have someone take you home after the procedure.  Do not eat or drink anything after midnight on the night before the procedure or as directed by your health care provider.  Ask your health care provider about:  Changing or stopping your regular medicines. This is especially important if you are taking diabetes medicines or blood thinners.  Taking medicines such as aspirin and ibuprofen. These medicines can thin your blood. Do not take these medicines before your procedure if your health care provider asks you not to.  Ask your health care provider about how your surgical site will be marked or identified.  You may be given antibiotic  medicines to help prevent infection. PROCEDURE   To reduce your risk of infection:  Your health care team will wash or sanitize their hands.  Your skin will be washed with soap.  An IV tube will be inserted into one of your veins. You will be given one or more of the following:  A medicine that makes you drowsy (sedative).  A medicine that makes you fall asleep (general anesthetic).  A medicine injected into your spine that numbs your body below the waist (spinal anesthetic).  An incision  will be made in your hip. Your surgeon will take out any damaged cartilage and bone.  Your surgeon will then:  Insert a prosthetic socket into the acetabulum of your pelvis. This is usually secured with screws.  Remove the femoral head and replace it with a prosthetic ball and stem secured into the top of your femur.  Place the ball into the socket and check the range of motion and stability of your new hip.  Close the incision and apply a bandage over the surgical site. AFTER THE PROCEDURE   You will stay in a recovery area until the medicines have worn off.  Your vital signs, such as your pulse and blood pressure, will be monitored.  Once you are awake and stable, you will be taken to a hospital room.  You may be directed to take actions to help prevent blood clots. These may include:  Walking soon after surgery, with someone assisting you. Moving around after surgery helps to improve blood flow.  Taking medicines to thin your blood (anticoagulants).  Wearing compression stockings or using different types of devices.  You will receive physical therapy until you are doing well and your health care provider feels it is safe for you to go home.   This information is not intended to replace advice given to you by your health care provider. Make sure you discuss any questions you have with your health care provider.   Document Released: 08/11/2000 Document Revised: 01/24/2015 Document Reviewed: 07/06/2013 Elsevier Interactive Patient Education 2016 Else[employer] Inc. General Anesthesia, Adult General anesthesia is a sleep-like state of non-feeling produced by medicines (anesthetics). General anesthesia prevents you from being alert and feeling pain during a medical procedure. Your caregiver may recommend general anesthesia if your procedure:  Is long.  Is painful or uncomfortable.  Would be frightening to see or hear.  Requires you to be still.  Affects your breathing.  Causes  significant blood loss. LET YOUR CAREGIVER KNOW ABOUT:  Allergies to food or medicine.  Medicines taken, including vitamins, herbs, eyedrops, over-the-counter medicines, and creams.  Use of steroids (by mouth or creams).  Previous problems with anesthetics or numbing medicines, including problems experienced by relatives.  History of bleeding problems or blood clots.  Previous surgeries and types of anesthetics received.  Possibility of pregnancy, if this applies.  Use of cigarettes, alcohol, or illegal drugs.  Any health condition(s), especially diabetes, sleep apnea, and high blood pressure. RISKS AND COMPLICATIONS General anesthesia rarely causes complications. However, if complications do occur, they can be life threatening. Complications include:  A lung infection.  A stroke.  A heart attack.  Waking up during the procedure. When this occurs, the patient may be unable to move and communicate that he or she is awake. The patient may feel severe pain. Older adults and adults with serious medical problems are more likely to have complications than adults who are young and healthy. Some complications can be prevented by  answering all of your caregiver's questions thoroughly and by following all pre-procedure instructions. It is important to tell your caregiver if any of the pre-procedure instructions, especially those related to diet, were not followed. Any food or liquid in the stomach can cause problems when you are under general anesthesia. BEFORE THE PROCEDURE  Ask your caregiver if you will have to spend the night at the hospital. If you will not have to spend the night, arrange to have an adult drive you and stay with you for 24 hours.  Follow your caregiver's instructions if you are taking dietary supplements or medicines. Your caregiver may tell you to stop taking them or to reduce your dosage.  Do not smoke for as long as possible before your procedure. If possible,  stop smoking 3-6 weeks before the procedure.  Do not take new dietary supplements or medicines within 1 week of your procedure unless your caregiver approves them.  Do not eat within 8 hours of your procedure or as directed by your caregiver. Drink only clear liquids, such as water, black coffee (without milk or cream), and fruit juices (without pulp).  Do not drink within 3 hours of your procedure or as directed by your caregiver.  You may brush your teeth on the morning of the procedure, but make sure to spit out the toothpaste and water when finished. PROCEDURE  You will receive anesthetics through a mask, through an intravenous (IV) access tube, or through both. A doctor who specializes in anesthesia (anesthesiologist) or a nurse who specializes in anesthesia (nurse anesthetist) or both will stay with you throughout the procedure to make sure you remain unconscious. He or she will also watch your blood pressure, pulse, and oxygen levels to make sure that the anesthetics do not cause any problems. Once you are asleep, a breathing tube or mask may be used to help you breathe. AFTER THE PROCEDURE You will wake up after the procedure is complete. You may be in the room where the procedure was performed or in a recovery area. You may have a sore throat if a breathing tube was used. You may also feel:  Dizzy.  Weak.  Drowsy.  Confused.  Nauseous.  Cold. These are all normal responses and can be expected to last for up to 24 hours after the procedure is complete. A caregiver will tell you when you are ready to go home. This will usually be when you are fully awake and in stable condition.   This information is not intended to replace advice given to you by your health care provider. Make sure you discuss any questions you have with your health care provider.   Document Released: 08/12/2007 Document Revised: 05/26/2014 Document Reviewed: 09/03/2011 Elsevier Interactive Patient Education  [employer]

## 2015-07-23 ENCOUNTER — Other Ambulatory Visit: Payer: Self-pay | Admitting: *Deleted

## 2015-07-23 ENCOUNTER — Encounter (HOSPITAL_COMMUNITY)
Admission: RE | Admit: 2015-07-23 | Discharge: 2015-07-23 | Disposition: A | Discharge status: Home / Self Care | Payer: 59 | Source: Ambulatory Visit | Attending: Orthopedic Surgery | Admitting: Orthopedic Surgery

## 2015-07-23 NOTE — Telephone Encounter (Signed)
UPDATEColin Reynolds Surgery Total Hip Arthroplasty, 27130, HAS BEEN APPROVED per [employer], under same reference #.  Patient is aware.  Please advise regarding RE-Scheduling of surgery date, pre-op appointment date. Patient [phone]

## 2015-07-24 ENCOUNTER — Other Ambulatory Visit: Payer: Self-pay

## 2015-07-24 ENCOUNTER — Encounter (HOSPITAL_COMMUNITY)
Admission: RE | Admit: 2015-07-24 | Discharge: 2015-07-24 | Disposition: A | Discharge status: Home / Self Care | Payer: 59 | Source: Ambulatory Visit | Attending: Orthopedic Surgery | Admitting: Orthopedic Surgery

## 2015-07-24 NOTE — Pre-Procedure Instructions (Signed)
Patients blood pressure has been historically elevated per EPIC and patient admission.  Was extremely elevated today with abnormal EKG reading.  Dr Benancio Deeds reviewed records and suggested cardiology clearance prior to proceeding with surgery.  Will notify Dr Romeo Apple for further instruction.  Patient verbalized understanding of rationale.  Will touch base with him as soon as i have discussed case with Dr Romeo Apple.

## 2015-07-24 NOTE — Pre-Procedure Instructions (Signed)
Spoke with Dr Starling Manns.  Advised to make follow up in his office tomorrow at 1:30.  Appointment made with Dr Wyline Mood at [facility] in the [place] office at 11:40 for follow up prior to procedure, per Dr Mort Sawyers request.  Patient notified and verbalized understanding.

## 2015-07-25 ENCOUNTER — Ambulatory Visit (INDEPENDENT_AMBULATORY_CARE_PROVIDER_SITE_OTHER): Payer: 59 | Admitting: Orthopedic Surgery

## 2015-07-25 ENCOUNTER — Encounter: Payer: Self-pay | Admitting: Orthopedic Surgery

## 2015-07-25 VITALS — BP 188/120 | HR 80 | Ht 66.0 in | Wt 172.8 lb

## 2015-07-25 DIAGNOSIS — M87051 Idiopathic aseptic necrosis of right femur: Secondary | ICD-10-CM | POA: Diagnosis not present

## 2015-07-25 DIAGNOSIS — I10 Essential (primary) hypertension: Secondary | ICD-10-CM | POA: Diagnosis not present

## 2015-07-25 DIAGNOSIS — M5136 Other intervertebral disc degeneration, lumbar region: Secondary | ICD-10-CM | POA: Diagnosis not present

## 2015-07-25 NOTE — Patient Instructions (Addendum)
Out of Work note starting 07/26/15 to 08/02/15 We will st you up with primary care doctor

## 2015-07-25 NOTE — Progress Notes (Signed)
Chief Complaint  Patient presents with  . Follow-up    Right hip    Preop assessment patient had flipped T waves as well as hypertension. He failed his preop clearance and will now see the cardiologist on Monday and will need a primary care appointment to address his hypertension  We took him out of work.  He is in stable condition today although his blood pressure was 188/120  He will come back in approximately a week distal I don't lose track of him and what's happening with his blood pressure I've warned him that he is at risk for stroke and significant heart disease.

## 2015-07-26 ENCOUNTER — Encounter (HOSPITAL_COMMUNITY): Admission: RE | Payer: Self-pay | Source: Ambulatory Visit

## 2015-07-26 ENCOUNTER — Inpatient Hospital Stay (HOSPITAL_COMMUNITY): Admission: RE | Admit: 2015-07-26 | Payer: 59 | Source: Ambulatory Visit | Admitting: Orthopedic Surgery

## 2015-07-26 SURGERY — ARTHROPLASTY, HIP, TOTAL,POSTERIOR APPROACH
Anesthesia: Choice | Laterality: Right

## 2015-07-26 SURGERY — ARTHROPLASTY, KNEE, TOTAL
Anesthesia: Spinal | Laterality: Right

## 2015-07-30 ENCOUNTER — Encounter: Payer: Self-pay | Admitting: Cardiology

## 2015-07-30 ENCOUNTER — Ambulatory Visit (INDEPENDENT_AMBULATORY_CARE_PROVIDER_SITE_OTHER): Payer: 59 | Admitting: Cardiology

## 2015-07-30 VITALS — BP 156/103 | HR 66 | Ht 66.0 in | Wt 173.0 lb

## 2015-07-30 DIAGNOSIS — I10 Essential (primary) hypertension: Secondary | ICD-10-CM

## 2015-07-30 DIAGNOSIS — R9431 Abnormal electrocardiogram [ECG] [EKG]: Secondary | ICD-10-CM | POA: Diagnosis not present

## 2015-07-30 DIAGNOSIS — I517 Cardiomegaly: Secondary | ICD-10-CM

## 2015-07-30 DIAGNOSIS — Z136 Encounter for screening for cardiovascular disorders: Secondary | ICD-10-CM

## 2015-07-30 MED ORDER — NICOTINE 21 MG/24HR TD PT24: 21.0000 mg | MEDICATED_PATCH | Freq: Every day | TRANSDERMAL | Status: DC

## 2015-07-30 MED ORDER — NICOTINE 7 MG/24HR TD PT24: 7.0000 mg | MEDICATED_PATCH | Freq: Every day | TRANSDERMAL | Status: DC

## 2015-07-30 MED ORDER — NICOTINE 14 MG/24HR TD PT24: 14.0000 mg | MEDICATED_PATCH | Freq: Every day | TRANSDERMAL | Status: DC

## 2015-07-30 NOTE — Telephone Encounter (Signed)
As of 07/24/15, surgery, CPT 27130, REF [id number], has been cancelled per Dr Romeo Apple and per [facility], due to need for cardiac clearance. Patient has been scheduled for cardiology appointment 07/31/15. 07/30/15, I have contacted [employer], ph [phone]; notified Erskine Squibb B, of the cancellation at this time of patient's surgery.

## 2015-07-30 NOTE — Patient Instructions (Addendum)
   Labs for CMET, Magnesium, CBC, TSH, FLP - Reminder:  Nothing to eat or drink after 12 midnight prior to labs. Your physician has requested that you have an echocardiogram. Echocardiography is a painless test that uses sound waves to create images of your heart. It provides your doctor with information about the size and shape of your heart and how well your heart's chambers and valves are working. This procedure takes approximately one hour. There are no restrictions for this procedure. Office will contact with results via phone or letter.   Nicotine patch 21mg  daily x 6 weeks, then 14mg  daily x 2 weeks, then 7mg  daily x 2 weeks, then STOP - new sent to [facility] today.   Continue all other medications.   DASH diet info given today. Follow up in  3-4 weeks

## 2015-07-30 NOTE — Progress Notes (Signed)
Patient ID: Tony Reynolds, male   DOB: 07-24-1970, 45 y.o.   MRN: [id number]     Clinical Summary Tony Reynolds is a 45 y.o.male seen today as a new patient, he is referred by Dr Fuller Canada for the following medical problems.   1. HTN - elevated bp's noted on multiple previous medical visits. Reports long history of HTN and was on a bp med some years ago but stopped taking because "it wasn't working" - was being evaluated for hip surgery however due to significant HTN and abnormal EKG it has been postponed   2. Abnormal EKG - severe LVH on EKG with strain pattern - denies any SOB/DOE, no chest pain. 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   Past Medical History  Diagnosis Date  . Eczema   . Back pain   . Hypertension      Allergies  Allergen Reactions  . Tomato     No acidic foods     Current Outpatient Prescriptions  Medication Sig Dispense Refill  . HYDROcodone-acetaminophen (NORCO/VICODIN) 5-325 MG tablet Take 1 tablet by mouth every 4 (four) hours as needed for moderate pain (Must last 14 days.Do not take and drive a car or use machinery.). 60 tablet 0  . loratadine (CLARITIN) 10 MG tablet Take 10 mg by mouth daily as needed for allergies.    . naproxen (NAPROSYN) 500 MG tablet Take 1 tablet (500 mg total) by mouth 2 (two) times daily with a meal. 60 tablet 5   No current facility-administered medications for this visit.     No past surgical history on file.   Allergies  Allergen Reactions  . Tomato     No acidic foods      Family History Patient denies any significant family history of heart disease.    Social History Tony Reynolds reports that he has been smoking Cigarettes.  He has been smoking about 0.50 packs per day. He does not have any smokeless tobacco history on file. Tony Reynolds reports that he drinks alcohol.   Review of Systems CONSTITUTIONAL: No weight loss, fever, chills, weakness or fatigue.  HEENT:  Eyes: No visual loss, blurred vision, double vision or yellow sclerae.No hearing loss, sneezing, congestion, runny nose or sore throat.  SKIN: No rash or itching.  CARDIOVASCULAR: per HPI RESPIRATORY: No shortness of breath, cough or sputum.  GASTROINTESTINAL: No anorexia, nausea, vomiting or diarrhea. No abdominal pain or blood.  GENITOURINARY: No burning on urination, no polyuria NEUROLOGICAL: No headache, dizziness, syncope, paralysis, ataxia, numbness or tingling in the extremities. No change in bowel or bladder control.  MUSCULOSKELETAL: No muscle, back pain, joint pain or stiffness.  LYMPHATICS: No enlarged nodes. No history of splenectomy.  PSYCHIATRIC: No history of depression or anxiety.  ENDOCRINOLOGIC: No reports of sweating, cold or heat intolerance. No polyuria or polydipsia.  Marland Kitchen   Physical Examination Filed Vitals:   07/30/15 1120 07/30/15 1121  BP: 163/105 156/103  Pulse: 64 66   Filed Vitals:   07/30/15 1120  Height:  (1.676 m)  Weight: 173 lb (78.472 kg)    Gen: resting comfortably, no acute distress HEENT: no scleral icterus, pupils equal round and reactive, no palptable cervical adenopathy,  CV: RRR, no m/r/g, no jvd Resp: Clear to auscultation bilaterally GI: abdomen is soft, non-tender, non-distended, normal bowel sounds, no hepatosplenomegaly MSK: extremities are warm, no edema.  Skin: warm, no rash Neuro:  no focal deficits Psych: appropriate affect   Diagnostic  Studies EKG 07/24/15 (independently reviewed): SR, LAE, LVH, ST/T changes inferior and lateral precordial leads probable LVH strain pattern.     Assessment and Plan  1. HTN - appears to have long history of nontreate HTN - we will obtain recent labs, pending results start chlorthalidone 25mg  daily as initial agent - discussed DASH diet and information provided.   2. Abnormal EKG/LVH - severe LVH, ST/T changes probable strain pattern - likely secondary to years of nontreated HTn -  will obtain echo  3. Tobacco abuse - will give Rx for nicotine patches, advised to quit  F/u 3-4 weeks  Antoine Poche, M.D.

## 2015-08-01 ENCOUNTER — Ambulatory Visit (INDEPENDENT_AMBULATORY_CARE_PROVIDER_SITE_OTHER): Payer: 59 | Admitting: Orthopedic Surgery

## 2015-08-01 ENCOUNTER — Other Ambulatory Visit: Payer: Self-pay | Admitting: Cardiology

## 2015-08-01 DIAGNOSIS — I10 Essential (primary) hypertension: Secondary | ICD-10-CM

## 2015-08-01 DIAGNOSIS — M87051 Idiopathic aseptic necrosis of right femur: Secondary | ICD-10-CM

## 2015-08-01 DIAGNOSIS — M5136 Other intervertebral disc degeneration, lumbar region: Secondary | ICD-10-CM | POA: Diagnosis not present

## 2015-08-01 LAB — CBC
HEMATOCRIT: 45.2 % (ref 39.0–52.0)
HEMOGLOBIN: 15.8 g/dL (ref 13.0–17.0)
MCH: 30.5 pg (ref 26.0–34.0)
MCHC: 35 g/dL (ref 30.0–36.0)
MCV: 87.3 fL (ref 78.0–100.0)
MPV: 9.6 fL (ref 8.6–12.4)
Platelets: 214 10*3/uL (ref 150–400)
RBC: 5.18 MIL/uL (ref 4.22–5.81)
RDW: 13.1 % (ref 11.5–15.5)
WBC: 5.1 10*3/uL (ref 4.0–10.5)

## 2015-08-01 LAB — COMPREHENSIVE METABOLIC PANEL
ALBUMIN: 4.3 g/dL (ref 3.6–5.1)
ALT: 45 U/L (ref 9–46)
AST: 34 U/L (ref 10–40)
Alkaline Phosphatase: 56 U/L (ref 40–115)
BILIRUBIN TOTAL: 0.5 mg/dL (ref 0.2–1.2)
BUN: 13 mg/dL (ref 7–25)
CALCIUM: 9.7 mg/dL (ref 8.6–10.3)
CO2: 28 mmol/L (ref 20–31)
CREATININE: 0.92 mg/dL (ref 0.60–1.35)
Chloride: 103 mmol/L (ref 98–110)
Glucose, Bld: 91 mg/dL (ref 65–99)
Potassium: 4.3 mmol/L (ref 3.5–5.3)
SODIUM: 139 mmol/L (ref 135–146)
TOTAL PROTEIN: 7.3 g/dL (ref 6.1–8.1)

## 2015-08-01 LAB — LIPID PANEL
CHOLESTEROL: 178 mg/dL (ref 125–200)
HDL: 81 mg/dL (ref >=40)
LDL Cholesterol: 88 mg/dL (ref <130)
TRIGLYCERIDES: 46 mg/dL (ref <150)
Total CHOL/HDL Ratio: 2.2 Ratio (ref <=5.0)
VLDL: 9 mg/dL (ref <30)

## 2015-08-01 LAB — MAGNESIUM: Magnesium: 2.1 mg/dL (ref 1.5–2.5)

## 2015-08-01 LAB — TSH: TSH: 0.8 mIU/L (ref 0.40–4.50)

## 2015-08-02 ENCOUNTER — Encounter: Payer: Self-pay | Admitting: Orthopedic Surgery

## 2015-08-02 ENCOUNTER — Other Ambulatory Visit: Payer: Self-pay

## 2015-08-02 ENCOUNTER — Ambulatory Visit (INDEPENDENT_AMBULATORY_CARE_PROVIDER_SITE_OTHER): Payer: 59

## 2015-08-02 DIAGNOSIS — R9431 Abnormal electrocardiogram [ECG] [EKG]: Secondary | ICD-10-CM | POA: Diagnosis not present

## 2015-08-02 NOTE — Progress Notes (Signed)
Follow-up visit the patient is now tied in with the cardiologist and Dr. Margo Aye for medical management.  He is in stable condition I took him out of work for now a month as he prepares for surgery we are waiting medical clearance cardiology clearance and when he gets that he will call us. Time spent 10 minutes

## 2015-08-07 ENCOUNTER — Telehealth: Payer: Self-pay | Admitting: *Deleted

## 2015-08-07 ENCOUNTER — Ambulatory Visit: Payer: 59 | Admitting: Orthopedic Surgery

## 2015-08-07 DIAGNOSIS — I10 Essential (primary) hypertension: Secondary | ICD-10-CM

## 2015-08-07 MED ORDER — CHLORTHALIDONE 25 MG PO TABS: 25.0000 mg | ORAL_TABLET | Freq: Every day | ORAL | Status: DC

## 2015-08-07 NOTE — Telephone Encounter (Signed)
-----   Message from Nori Riis, RN sent at 08/06/2015  2:10 PM EDT ----- Jonita Albee pt  ----- Message -----    From: Antoine Poche, MD    Sent: 08/06/2015   1:37 PM      To: Nori Riis, RN  Labs look good. Have him start chlorthalidone 25mg  daily, repeat BMEt and Mg in 2 weeks  Dominga Ferry MD

## 2015-08-07 NOTE — Telephone Encounter (Signed)
Pt aware, med sent to pharmacy, no pcp. Lab orders mail to pt

## 2015-08-08 ENCOUNTER — Ambulatory Visit: Payer: 59 | Admitting: Orthopedic Surgery

## 2015-08-10 ENCOUNTER — Ambulatory Visit: Payer: 59 | Admitting: Orthopedic Surgery

## 2015-08-20 ENCOUNTER — Ambulatory Visit (INDEPENDENT_AMBULATORY_CARE_PROVIDER_SITE_OTHER): Payer: 59 | Admitting: Cardiology

## 2015-08-20 ENCOUNTER — Telehealth: Payer: Self-pay | Admitting: Orthopedic Surgery

## 2015-08-20 ENCOUNTER — Encounter: Payer: Self-pay | Admitting: Cardiology

## 2015-08-20 VITALS — BP 159/108 | HR 68 | Ht 66.0 in | Wt 171.6 lb

## 2015-08-20 DIAGNOSIS — Z01810 Encounter for preprocedural cardiovascular examination: Secondary | ICD-10-CM | POA: Diagnosis not present

## 2015-08-20 DIAGNOSIS — I517 Cardiomegaly: Secondary | ICD-10-CM

## 2015-08-20 DIAGNOSIS — I10 Essential (primary) hypertension: Secondary | ICD-10-CM | POA: Diagnosis not present

## 2015-08-20 MED ORDER — AMLODIPINE BESYLATE 10 MG PO TABS: 10.0000 mg | ORAL_TABLET | Freq: Every day | ORAL | Status: DC

## 2015-08-20 NOTE — Patient Instructions (Signed)
Your physician recommends that you schedule a follow-up appointment in: 2 MONTHS WITH DR. BRANCH  Your physician has recommended you make the following change in your medication:   START AMLODIPINE 10 MG DAILY  2 WEEK NURSE VISIT FOR BLOOD PRESSURE CHECK  Your physician recommends that you return for lab work BMP/MG  Thank you for choosing [facility]!!

## 2015-08-20 NOTE — Progress Notes (Signed)
Patient ID: Tony Reynolds, male   DOB: August 26, 1970, 45 y.o.   MRN: [id number]     Clinical Summary Tony Reynolds is a 45 y.o.male seen today for follow up of the following medical problems.   1. HTN - elevated bp's noted on multiple previous medical visits. Reports long history of HTN and was on a bp med some years ago but stopped taking because "it wasn't working" - was being evaluated for hip surgery however due to significant HTN and abnormal EKG it has been postponed  - last visit we started chlorthalidone 25mg  daily (started on 08/01/15). He did not go for repeat labs since last visit - not checking bp at home. Compliant with meds  2. Abnormal EKG - severe LVH on EKG with strain pattern - denies any SOB/DOE, no chest pain. 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  - echo with LVEF 60-65%, no WMAs, mild LVH.   3. Preoperative evaluation - hip replacement surgery postponed awaiting bp control - tolerates greater than 4 METs regularly without limitation.      Past Medical History  Diagnosis Date  . Eczema   . Back pain   . Hypertension      Allergies  Allergen Reactions  . Tomato     No acidic foods     Current Outpatient Prescriptions  Medication Sig Dispense Refill  . chlorthalidone (HYGROTON) 25 MG tablet Take 1 tablet (25 mg total) by mouth daily. 90 tablet 3  . HYDROcodone-acetaminophen (NORCO/VICODIN) 5-325 MG tablet Take 1 tablet by mouth every 4 (four) hours as needed for moderate pain (Must last 14 days.Do not take and drive a car or use machinery.). 60 tablet 0  . loratadine (CLARITIN) 10 MG tablet Take 10 mg by mouth daily as needed for allergies.    . naproxen (NAPROSYN) 500 MG tablet Take 1 tablet (500 mg total) by mouth 2 (two) times daily with a meal. 60 tablet 5  . nicotine (NICODERM CQ - DOSED IN MG/24 HOURS) 14 mg/24hr patch Place 1 patch (14 mg total) onto the skin daily. 14 patch 0  . nicotine (NICODERM CQ - DOSED  IN MG/24 HOURS) 21 mg/24hr patch Place 1 patch (21 mg total) onto the skin daily. 28 patch 0  . nicotine (NICODERM CQ - DOSED IN MG/24 HR) 7 mg/24hr patch Place 1 patch (7 mg total) onto the skin daily. 14 patch 0   No current facility-administered medications for this visit.     No past surgical history on file.   Allergies  Allergen Reactions  . Tomato     No acidic foods      No family history on file.   Social History Tony Reynolds reports that he has been smoking Cigarettes.  He started smoking about 26 years ago. He has been smoking about 0.50 packs per day. He has never used smokeless tobacco. Tony Reynolds reports that he drinks alcohol.   Review of Systems CONSTITUTIONAL: No weight loss, fever, chills, weakness or fatigue.  HEENT: Eyes: No visual loss, blurred vision, double vision or yellow sclerae.No hearing loss, sneezing, congestion, runny nose or sore throat.  SKIN: No rash or itching.  CARDIOVASCULAR: per HPI RESPIRATORY: No shortness of breath, cough or sputum.  GASTROINTESTINAL: No anorexia, nausea, vomiting or diarrhea. No abdominal pain or blood.  GENITOURINARY: No burning on urination, no polyuria NEUROLOGICAL: No headache, dizziness, syncope, paralysis, ataxia, numbness or tingling in the extremities. No change in bowel or bladder control.  MUSCULOSKELETAL: No muscle, back pain, joint pain or stiffness.  LYMPHATICS: No enlarged nodes. No history of splenectomy.  PSYCHIATRIC: No history of depression or anxiety.  ENDOCRINOLOGIC: No reports of sweating, cold or heat intolerance. No polyuria or polydipsia.  Marland Kitchen   Physical Examination Filed Vitals:   08/20/15 1022 08/20/15 1025  BP: 160/121 159/108  Pulse: 66 68   Filed Vitals:   08/20/15 1022  Height:  (1.676 m)  Weight: 171 lb 9.6 oz (77.837 kg)    Gen: resting comfortably, no acute distress HEENT: no scleral icterus, pupils equal round and reactive, no palptable cervical adenopathy,  CV: RRR, no  m/r/g, no jvd Resp: Clear to auscultation bilaterally GI: abdomen is soft, non-tender, non-distended, normal bowel sounds, no hepatosplenomegaly MSK: extremities are warm, no edema.  Skin: warm, no rash Neuro:  no focal deficits Psych: appropriate affect   Diagnostic Studies EKG 07/24/15 (independently reviewed): SR, LAE, LVH, ST/T changes inferior and lateral precordial leads probable LVH strain pattern.   07/2015 echo Study Conclusions  - Left ventricle: The cavity size was normal. Wall thickness was  increased in a pattern of mild LVH. Systolic function was normal.  The estimated ejection fraction was in the range of 60% to 65%.  Wall motion was normal; there were no regional wall motion  abnormalities. Doppler parameters are consistent with abnormal  left ventricular relaxation (grade 1 diastolic dysfunction). - Mitral valve: Mildly thickened leaflets . There was trivial  regurgitation. - Atrial septum: No defect or patent foramen ovale was identified. - Tricuspid valve: There was trivial regurgitation. - Pericardium, extracardiac: There was no pericardial effusion.  Impressions:  - Mild LVH with LVEF 60-65%. Grade 1 diastolic dysfunction with  normal estimated LV filling pressure. Mildly thickened mitral  leaflets with trivial mitral regurgitation. Trivial tricuspid  regurgitation, unable to estimate PASP.   Assessment and Plan   1. HTN - not at goal. Will start norvasc  daily - reorder labs since he started diuretic last visit.  - he will return in 2 weeks for nursing bp check   2. Abnormal EKG/LVH - severe LVH, ST/T changes probable strain pattern - likely secondary to years of nontreated HTN -echo with LVEF 60-65%, mild LVH.    3. Preoperative evaluation - awaiting bp control prior to hip surgery.     Antoine Poche, M.D.

## 2015-08-20 NOTE — Telephone Encounter (Signed)
Patient stopped by to relay that he is still following up with his cardiologist at [facility]; therefore, still not cleared yet. Sstates he is to next return for a follow-up cardiology appointment on 11/05/15.  Is he to receive a note from our office to continue out of work until at least this date, pending surgery?  Please advise.  PH# [phone]

## 2015-08-28 ENCOUNTER — Other Ambulatory Visit: Payer: Self-pay | Admitting: Cardiology

## 2015-08-28 LAB — MAGNESIUM: Magnesium: 2 mg/dL (ref 1.5–2.5)

## 2015-08-28 LAB — BASIC METABOLIC PANEL
BUN: 10 mg/dL (ref 7–25)
CHLORIDE: 101 mmol/L (ref 98–110)
CO2: 28 mmol/L (ref 20–31)
CREATININE: 0.78 mg/dL (ref 0.60–1.35)
Calcium: 9.4 mg/dL (ref 8.6–10.3)
Glucose, Bld: 96 mg/dL (ref 65–99)
POTASSIUM: 3.7 mmol/L (ref 3.5–5.3)
SODIUM: 140 mmol/L (ref 135–146)

## 2015-08-30 ENCOUNTER — Encounter: Payer: Self-pay | Admitting: Orthopedic Surgery

## 2015-08-30 NOTE — Telephone Encounter (Signed)
Per Dr Mort Sawyers review 08/23/15 and verbal instructions regarding request for update of work status note, patient is to remain out of work until further notice, pending surgery.  Awaiting clearance from cardiologist.  Patient aware.

## 2015-09-03 ENCOUNTER — Ambulatory Visit (INDEPENDENT_AMBULATORY_CARE_PROVIDER_SITE_OTHER): Payer: 59 | Admitting: *Deleted

## 2015-09-03 ENCOUNTER — Telehealth: Payer: Self-pay | Admitting: *Deleted

## 2015-09-03 VITALS — BP 153/101 | HR 77

## 2015-09-03 DIAGNOSIS — I10 Essential (primary) hypertension: Secondary | ICD-10-CM

## 2015-09-03 NOTE — Progress Notes (Signed)
Patient in office for BP check.  Recently started on Amlodipine 10mg  daily.  After further review of medications, patient stated that he had not been taking the Chlorthalidone.  Stated that he didn't think he was supposed to be taking both together & stopped it when he was prescribed the Amlodipine.  Explained to him that the Amlodipine would have been in addition to what he was already taking.  Advised him to go ahead & take the Chlorthalidone when he gets home.  Stated he took his Norvasc around 7:30 this morning.

## 2015-09-03 NOTE — Telephone Encounter (Signed)
Called patient with test results. No answer. Unable to leave message to call back.  

## 2015-09-03 NOTE — Telephone Encounter (Signed)
-----   Message from Antoine Poche, MD sent at 08/31/2015 10:59 AM EDT ----- Labs look good, continue current meds  Dominga Ferry MD

## 2015-09-04 ENCOUNTER — Other Ambulatory Visit: Payer: Self-pay | Admitting: *Deleted

## 2015-09-04 ENCOUNTER — Telehealth: Payer: Self-pay | Admitting: Orthopedic Surgery

## 2015-09-04 MED ORDER — HYDROCODONE-ACETAMINOPHEN 5-325 MG PO TABS: 1.0000 | ORAL_TABLET | ORAL | Status: DC | PRN

## 2015-09-04 NOTE — Telephone Encounter (Signed)
Hydrocodone-Acetaminophen 5/325mg   Qty 60 Tablets Take 1 tablet by mouth every 4 (four) hours as needed for moderate pain.

## 2015-09-04 NOTE — Progress Notes (Signed)
Call placed to patient, stated he has not purchased a BP monitor yet.  Nurse visit scheduled for 2 weeks to recheck blood pressure.  (09/17/15 at 8:45)

## 2015-09-17 ENCOUNTER — Encounter: Payer: Self-pay | Admitting: Cardiology

## 2015-09-19 ENCOUNTER — Telehealth: Payer: Self-pay | Admitting: Cardiology

## 2015-09-19 NOTE — Telephone Encounter (Signed)
I believe this was routed to [place] in error

## 2015-09-19 NOTE — Telephone Encounter (Signed)
Patient was told by Dr Scharlene Gloss office to contact our nurses about his BP medication

## 2015-09-19 NOTE — Telephone Encounter (Signed)
Returned call to patient.  He was requesting refills on his BP medications.  Informed him that his bottles should already have refills on them & to notify pharmacy to request the refill.     Also, reminded patient that he missed his nurse visit on Monday for a nurse BP check.  Patient apologized & stated that he forgot.  Stated that he has been to see Dr. Margo Aye the last week of April & his BP there in the office was 152/84.  Checked this morning at [facility] was 147/87.  Stated he still plans to buy monitor for home when he can afford to do so.  Stated he will go back for follow up with Dr. Margo Aye on 10/02/2015.  Follow up with Dr. Wyline Mood is scheduled for 10/29/2015.  Patient also mentioned that Dr. Margo Aye said he may give him a different pill so he only has to take one pill instead of two.  Requested that he call office back to notify Dr. Wyline Mood if he does this.  Patient verbalized understanding.

## 2015-09-26 ENCOUNTER — Telehealth: Payer: Self-pay | Admitting: Cardiology

## 2015-09-26 DIAGNOSIS — Z79899 Other long term (current) drug therapy: Secondary | ICD-10-CM

## 2015-09-26 MED ORDER — LISINOPRIL 20 MG PO TABS: 20.0000 mg | ORAL_TABLET | Freq: Every day | ORAL | Status: DC

## 2015-09-26 NOTE — Telephone Encounter (Signed)
Sent Dr. Wyline Mood staff message with pt questions.

## 2015-09-26 NOTE — Telephone Encounter (Signed)
Stop chlorthalidone and start lisinopril 20 mg daily. Pt agrees to get bmet done in 2 weeks. He will let us know his bp readings next week.

## 2015-09-26 NOTE — Telephone Encounter (Signed)
pls call the pt

## 2015-10-02 ENCOUNTER — Telehealth: Payer: Self-pay | Admitting: Orthopedic Surgery

## 2015-10-02 NOTE — Telephone Encounter (Signed)
Patient came by questioning about medical clearance for surgery.  He says he has been cleared by PCP.  He wants you to check on this for him.  Thanks

## 2015-10-03 NOTE — Telephone Encounter (Signed)
I called Dr. Keane Police office, and spoke with Angie. I informed her we do not have a medical clearance form, and that normally the primary care physician just types up a letter and sends it to [place] after that patient has been given clearance.

## 2015-10-04 LAB — BASIC METABOLIC PANEL
BUN: 10 mg/dL (ref 7–25)
CHLORIDE: 104 mmol/L (ref 98–110)
CO2: 22 mmol/L (ref 20–31)
CREATININE: 0.82 mg/dL (ref 0.60–1.35)
Calcium: 8.9 mg/dL (ref 8.6–10.3)
GLUCOSE: 88 mg/dL (ref 65–99)
Potassium: 3.7 mmol/L (ref 3.5–5.3)
Sodium: 136 mmol/L (ref 135–146)

## 2015-10-12 NOTE — Telephone Encounter (Signed)
Patient called to follow up on status - asking if we received the clearance letter from Dr. Dwana Melena as of yet, per previous note, for patient to proceed with total hip surgery.  I relayed to patient that it appears that he would also require clearance from cardiologist; he states his appointment there is 10/29/15.  Please advise if letter from Dr Margo Aye has been received. Patient's ph# [phone].

## 2015-10-17 NOTE — Telephone Encounter (Signed)
Patient advised that he needs clearance for surgery from his cardiologist. He is asking if he can have something stronger for pain. I advised that you didn't like to give stronger medications before surgery. Please advise

## 2015-10-19 ENCOUNTER — Other Ambulatory Visit: Payer: Self-pay | Admitting: *Deleted

## 2015-10-19 MED ORDER — AMLODIPINE BESYLATE 10 MG PO TABS: 10.0000 mg | ORAL_TABLET | Freq: Every day | ORAL | Status: DC

## 2015-10-19 MED ORDER — HYDROCODONE-ACETAMINOPHEN 5-325 MG PO TABS: 1.0000 | ORAL_TABLET | ORAL | Status: DC | PRN

## 2015-10-19 NOTE — Telephone Encounter (Signed)
Agreed no stronger pain medication at this time

## 2015-10-23 NOTE — Telephone Encounter (Signed)
Patient informed. 

## 2015-10-29 ENCOUNTER — Encounter: Payer: Self-pay | Admitting: Cardiology

## 2015-10-29 ENCOUNTER — Ambulatory Visit (INDEPENDENT_AMBULATORY_CARE_PROVIDER_SITE_OTHER): Payer: 59 | Admitting: Cardiology

## 2015-10-29 VITALS — BP 126/83 | HR 66 | Ht 66.0 in | Wt 176.2 lb

## 2015-10-29 DIAGNOSIS — I10 Essential (primary) hypertension: Secondary | ICD-10-CM

## 2015-10-29 DIAGNOSIS — Z01810 Encounter for preprocedural cardiovascular examination: Secondary | ICD-10-CM

## 2015-10-29 DIAGNOSIS — R9431 Abnormal electrocardiogram [ECG] [EKG]: Secondary | ICD-10-CM | POA: Diagnosis not present

## 2015-10-29 MED ORDER — LISINOPRIL 40 MG PO TABS: 40.0000 mg | ORAL_TABLET | Freq: Every day | ORAL | Status: DC

## 2015-10-29 NOTE — Progress Notes (Addendum)
Patient ID: Tony Reynolds, male   DOB: 1970/05/25, 45 y.o.   MRN: [id number]     Clinical Summary Mr. Blessinger is a 45 y.o.male seen today for follow up of the following medical problems.   1. HTN - bp's have recently been controlled - recent erectile dysfunction. Chlorthalidone was stopped, symptoms are not improved. .    2. Abnormal EKG - severe LVH on EKG with strain pattern - denies any SOB/DOE, no chest pain. 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  - echo with LVEF 60-65%, no WMAs, mild LVH.   3. Preoperative evaluation - hip replacement surgery postponed awaiting bp control - tolerates greater than 4 METs regularly without limitation.  Past Medical History  Diagnosis Date  . Eczema   . Back pain   . Hypertension      Allergies  Allergen Reactions  . Tomato     No acidic foods     Current Outpatient Prescriptions  Medication Sig Dispense Refill  . amLODipine (NORVASC) 10 MG tablet Take 1 tablet (10 mg total) by mouth daily. 90 tablet 3  . HYDROcodone-acetaminophen (NORCO/VICODIN) 5-325 MG tablet Take 1 tablet by mouth every 4 (four) hours as needed for moderate pain (Must last 14 days.Do not take and drive a car or use machinery.). 60 tablet 0  . lisinopril (PRINIVIL,ZESTRIL) 20 MG tablet Take 1 tablet (20 mg total) by mouth daily. 90 tablet 3  . loratadine (CLARITIN) 10 MG tablet Take 10 mg by mouth daily as needed for allergies.    . naproxen (NAPROSYN) 500 MG tablet Take 1 tablet (500 mg total) by mouth 2 (two) times daily with a meal. (Patient not taking: Reported on 08/20/2015) 60 tablet 5   No current facility-administered medications for this visit.     No past surgical history on file.   Allergies  Allergen Reactions  . Tomato     No acidic foods      No family history on file.   Social History Mr. Moss reports that he has been smoking Cigarettes.  He started smoking about 26 years ago. He has been  smoking about 0.50 packs per day. He has never used smokeless tobacco. Mr. Depoy reports that he drinks alcohol.   Review of Systems CONSTITUTIONAL: No weight loss, fever, chills, weakness or fatigue.  HEENT: Eyes: No visual loss, blurred vision, double vision or yellow sclerae.No hearing loss, sneezing, congestion, runny nose or sore throat.  SKIN: No rash or itching.  CARDIOVASCULAR: per HPI RESPIRATORY: No shortness of breath, cough or sputum.  GASTROINTESTINAL: No anorexia, nausea, vomiting or diarrhea. No abdominal pain or blood.  GENITOURINARY: No burning on urination, no polyuria NEUROLOGICAL: No headache, dizziness, syncope, paralysis, ataxia, numbness or tingling in the extremities. No change in bowel or bladder control.  MUSCULOSKELETAL: No muscle, back pain, joint pain or stiffness.  LYMPHATICS: No enlarged nodes. No history of splenectomy.  PSYCHIATRIC: No history of depression or anxiety.  ENDOCRINOLOGIC: No reports of sweating, cold or heat intolerance. No polyuria or polydipsia.  Marland Kitchen   Physical Examination Filed Vitals:   10/29/15 1351  BP: 126/83  Pulse: 66   Filed Vitals:   10/29/15 1351  Height:  (1.676 m)  Weight: 176 lb 3.2 oz (79.924 kg)    Gen: resting comfortably, no acute distress HEENT: no scleral icterus, pupils equal round and reactive, no palptable cervical adenopathy,  CV: RRR, no m/r/g, no jvd Resp: Clear to auscultation bilaterally GI: abdomen  is soft, non-tender, non-distended, normal bowel sounds, no hepatosplenomegaly MSK: extremities are warm, no edema.  Skin: warm, no rash Neuro:  no focal deficits Psych: appropriate affect   Diagnostic Studies EKG 07/24/15 (independently reviewed): SR, LAE, LVH, ST/T changes inferior and lateral precordial leads probable LVH strain pattern.   07/2015 echo Study Conclusions  - Left ventricle: The cavity size was normal. Wall thickness was  increased in a pattern of mild LVH. Systolic function was  normal.  The estimated ejection fraction was in the range of 60% to 65%.  Wall motion was normal; there were no regional wall motion  abnormalities. Doppler parameters are consistent with abnormal  left ventricular relaxation (grade 1 diastolic dysfunction). - Mitral valve: Mildly thickened leaflets . There was trivial  regurgitation. - Atrial septum: No defect or patent foramen ovale was identified. - Tricuspid valve: There was trivial regurgitation. - Pericardium, extracardiac: There was no pericardial effusion.  Impressions:  - Mild LVH with LVEF 60-65%. Grade 1 diastolic dysfunction with  normal estimated LV filling pressure. Mildly thickened mitral  leaflets with trivial mitral regurgitation. Trivial tricuspid  regurgitation, unable to estimate PASP.    Assessment and Plan  1. HTN - bp is at goal - due to ongoing erectile dysfunction that seemed to start at the time of starting bp meds, we will stop norvasc and increase lisonpril to 40mg  daily. Repeat BMET in 2 weeks.  - he will present next week for bp check   2. Abnormal EKG/LVH - severe LVH, ST/T changes probable strain pattern - likely secondary to years of nontreated HTN -echo with LVEF 60-65%, mild LVH.  - no further cardiac tesitng.   3. Preoperative evaluation - awaiting bp control prior to hip surgery.  - if bp looks good at nursing visit next week will clear for surgery.      Antoine Poche, M.D.   11/07/15 Addendum BP is controlled on current regimen. Abnormal EKG due to LVH, echo shows normal LV function. Recommend proceeding with surgery as planned.  Dominga Ferry MD

## 2015-10-29 NOTE — Patient Instructions (Signed)
Your physician wants you to follow-up in: 6 MONTHS WITH DR. BRANCH You will receive a reminder letter in the mail two months in advance. If you don't receive a letter, please call our office to schedule the follow-up appointment.  Your physician has recommended you make the following change in your medication:   HOLD AMLODIPINE   INCREASE LISINOPRIL 40 MG DAILY  Your physician recommends that you return for lab work in: 2 WEEKS BMP  COME BACK IN 1 WEEK FOR NURSE BLOOD PRESSURE CHECK.  Thank you for choosing [facility]!!

## 2015-11-05 ENCOUNTER — Ambulatory Visit: Payer: 59 | Admitting: Cardiology

## 2015-11-06 ENCOUNTER — Telehealth: Payer: Self-pay | Admitting: Cardiology

## 2015-11-06 NOTE — Telephone Encounter (Signed)
Patient would like to know if he can have BP check done in [place].  He is scheduled here in [facility] 11/07/15

## 2015-11-07 ENCOUNTER — Ambulatory Visit (INDEPENDENT_AMBULATORY_CARE_PROVIDER_SITE_OTHER): Payer: 59

## 2015-11-07 VITALS — BP 126/74 | HR 66 | Ht 66.0 in | Wt 174.0 lb

## 2015-11-07 DIAGNOSIS — I10 Essential (primary) hypertension: Secondary | ICD-10-CM

## 2015-11-07 NOTE — Patient Instructions (Signed)
Continue medications as directed unless we call you with something else     Thank you for choosing [facility] !

## 2015-11-07 NOTE — Progress Notes (Signed)
Patient came for BP check per dr Wyline Mood, prior to surgical clearance  BP 126/74

## 2015-11-08 ENCOUNTER — Telehealth: Payer: Self-pay

## 2015-11-08 NOTE — Progress Notes (Signed)
Bp's look good, he is ok to go for surgery. Can we forward may addended last clinic note to his surgeon and let patient know  Dominga Ferry MD

## 2015-11-08 NOTE — Telephone Encounter (Signed)
-----   Message from Antoine Poche, MD sent at 11/08/2015  3:00 PM EDT -----   ----- Message -----    From: Nori Riis, RN    Sent: 11/07/2015   1:41 PM      To: Antoine Poche, MD

## 2015-11-08 NOTE — Telephone Encounter (Signed)
LM on girlfriends cell,will forward Dr Verna Czech note to Dr Romeo Apple

## 2015-11-12 ENCOUNTER — Telehealth: Payer: Self-pay | Admitting: Orthopedic Surgery

## 2015-11-12 NOTE — Telephone Encounter (Signed)
Patient states (1) Dr Wyline Mood "approved for the total hip surgery" - please send letter of request and/or please advise.  Okay to schedule appointment to re-discuss surgery, since office schedule is out till week of 12/10/15?                        (2) Needs refill of pain medication Hydrocodone(Norco/Vicodin) 5-325, quantity 60 Please advise.

## 2015-11-12 NOTE — Telephone Encounter (Signed)
Routing to Dr. Harrison to advise 

## 2015-11-13 ENCOUNTER — Other Ambulatory Visit: Payer: Self-pay | Admitting: Cardiology

## 2015-11-13 LAB — BASIC METABOLIC PANEL
BUN: 17 mg/dL (ref 7–25)
CALCIUM: 9.4 mg/dL (ref 8.6–10.3)
CO2: 23 mmol/L (ref 20–31)
Chloride: 103 mmol/L (ref 98–110)
Creat: 0.92 mg/dL (ref 0.60–1.35)
Glucose, Bld: 89 mg/dL (ref 65–99)
Potassium: 4.4 mmol/L (ref 3.5–5.3)
SODIUM: 139 mmol/L (ref 135–146)

## 2015-11-14 ENCOUNTER — Other Ambulatory Visit: Payer: Self-pay | Admitting: *Deleted

## 2015-11-14 MED ORDER — HYDROCODONE-ACETAMINOPHEN 5-325 MG PO TABS: 1.0000 | ORAL_TABLET | ORAL | Status: DC | PRN

## 2015-11-15 ENCOUNTER — Encounter: Payer: Self-pay | Admitting: Orthopedic Surgery

## 2015-11-15 ENCOUNTER — Ambulatory Visit (INDEPENDENT_AMBULATORY_CARE_PROVIDER_SITE_OTHER): Payer: 59 | Admitting: Orthopedic Surgery

## 2015-11-15 VITALS — BP 131/90 | HR 84 | Ht 67.75 in | Wt 173.4 lb

## 2015-11-15 DIAGNOSIS — M5136 Other intervertebral disc degeneration, lumbar region: Secondary | ICD-10-CM | POA: Diagnosis not present

## 2015-11-15 DIAGNOSIS — M87051 Idiopathic aseptic necrosis of right femur: Secondary | ICD-10-CM | POA: Diagnosis not present

## 2015-11-15 NOTE — Progress Notes (Signed)
Chief Complaint  Patient presents with  . Follow-up    Right hip pain. Wants to schedule surgery.    Patient had trouble with his blood pressure and abnormal EKG seen the cardiologist and had his blood pressure stabilized  Cardiologist Dr. Wyline Mood  He is ready for surgery  BP 131/90 mmHg  Pulse 84  Ht 5' 7.75" (1.721 m)  Wt 173 lb 6.4 oz (78.654 kg)  BMI 26.56 kg/m2  I've discussed with him Extensively about possibility of anterior hip approach and will try to make arrangements for appointment with Dr. Magnus Ivan   Number [phone]  Visit was dominated by time 15 minutes

## 2015-12-05 ENCOUNTER — Other Ambulatory Visit: Payer: Self-pay | Admitting: Orthopaedic Surgery

## 2015-12-06 NOTE — Patient Instructions (Addendum)
EMILE MENTION  12/06/2015   Your procedure is scheduled on: 12/14/2015    Report to [facility]  Entrance take [facility]  elevators to 3rd floor to  Short Stay Center at   1000 AM.  Call this number if you have problems the morning of surgery [phone]   Remember: ONLY 1 PERSON MAY GO WITH YOU TO SHORT STAY TO GET  READY MORNING OF YOUR SURGERY.  Do not eat food or drink liquids :After Midnight.     Take these medicines the morning of surgery with A SIP OF WATER: Amlodipine ( Norvasc), Hydrocodone if needed                                 You may not have any metal on your body including hair pins and              piercings  Do not wear jewelry,  lotions, powders or perfumes, deodorant                          Men may shave face and neck.   Do not bring valuables to the hospital. [facility] IS NOT             RESPONSIBLE   FOR VALUABLES.  Contacts, dentures or bridgework may not be worn into surgery.  Leave suitcase in the car. After surgery it may be brought to your room.               Please read over the following fact sheets you were given: _____________________________________________________________________             [facility] - Preparing for Surgery Before surgery, you can play an important role.  Because skin is not sterile, your skin needs to be as free of germs as possible.  You can reduce the number of germs on your skin by washing with CHG (chlorahexidine gluconate) soap before surgery.  CHG is an antiseptic cleaner which kills germs and bonds with the skin to continue killing germs even after washing. Please DO NOT use if you have an allergy to CHG or antibacterial soaps.  If your skin becomes reddened/irritated stop using the CHG and inform your nurse when you arrive at Short Stay. Do not shave (including legs and underarms) for at least 48 hours prior to the first CHG shower.  You may shave your face/neck. Please follow these  instructions carefully:  1.  Shower with CHG Soap the night before surgery and the  morning of Surgery.  2.  If you choose to wash your hair, wash your hair first as usual with your  normal  shampoo.  3.  After you shampoo, rinse your hair and body thoroughly to remove the  shampoo.                           4.  Use CHG as you would any other liquid soap.  You can apply chg directly  to the skin and wash                       Gently with a scrungie or clean washcloth.  5.  Apply the CHG Soap to your body ONLY FROM THE NECK DOWN.  Do not use on face/ open                           Wound or open sores. Avoid contact with eyes, ears mouth and genitals (private parts).                       Wash face,  Genitals (private parts) with your normal soap.             6.  Wash thoroughly, paying special attention to the area where your surgery  will be performed.  7.  Thoroughly rinse your body with warm water from the neck down.  8.  DO NOT shower/wash with your normal soap after using and rinsing off  the CHG Soap.                9.  Pat yourself dry with a clean towel.            10.  Wear clean pajamas.            11.  Place clean sheets on your bed the night of your first shower and do not  sleep with pets. Day of Surgery : Do not apply any lotions/deodorants the morning of surgery.  Please wear clean clothes to the hospital/surgery center.  FAILURE TO FOLLOW THESE INSTRUCTIONS MAY RESULT IN THE CANCELLATION OF YOUR SURGERY PATIENT SIGNATURE_________________________________  NURSE SIGNATURE__________________________________  ________________________________________________________________________  WHAT IS A BLOOD TRANSFUSION? Blood Transfusion Information  A transfusion is the replacement of blood or some of its parts. Blood is made up of multiple cells which provide different functions.  Red blood cells carry oxygen and are used for blood loss replacement.  White blood cells fight against  infection.  Platelets control bleeding.  Plasma helps clot blood.  Other blood products are available for specialized needs, such as hemophilia or other clotting disorders. BEFORE THE TRANSFUSION  Who gives blood for transfusions?   Healthy volunteers who are fully evaluated to make sure their blood is safe. This is blood bank blood. Transfusion therapy is the safest it has ever been in the practice of medicine. Before blood is taken from a donor, a complete history is taken to make sure that person has no history of diseases nor engages in risky social behavior (examples are intravenous drug use or sexual activity with multiple partners). The donor's travel history is screened to minimize risk of transmitting infections, such as malaria. The donated blood is tested for signs of infectious diseases, such as HIV and hepatitis. The blood is then tested to be sure it is compatible with you in order to minimize the chance of a transfusion reaction. If you or a relative donates blood, this is often done in anticipation of surgery and is not appropriate for emergency situations. It takes many days to process the donated blood. RISKS AND COMPLICATIONS Although transfusion therapy is very safe and saves many lives, the main dangers of transfusion include:   Getting an infectious disease.  Developing a transfusion reaction. This is an allergic reaction to something in the blood you were given. Every precaution is taken to prevent this. The decision to have a blood transfusion has been considered carefully by your caregiver before blood is given. Blood is not given unless the benefits outweigh the risks. AFTER THE TRANSFUSION  Right after receiving a blood transfusion, you will usually feel much better and more energetic. This is especially  true if your red blood cells have gotten low (anemic). The transfusion raises the level of the red blood cells which carry oxygen, and this usually causes an energy  increase.  The nurse administering the transfusion will monitor you carefully for complications. HOME CARE INSTRUCTIONS  No special instructions are needed after a transfusion. You may find your energy is better. Speak with your caregiver about any limitations on activity for underlying diseases you may have. SEEK MEDICAL CARE IF:   Your condition is not improving after your transfusion.  You develop redness or irritation at the intravenous (IV) site. SEEK IMMEDIATE MEDICAL CARE IF:  Any of the following symptoms occur over the next 12 hours:  Shaking chills.  You have a temperature by mouth above 102 F (38.9 C), not controlled by medicine.  Chest, back, or muscle pain.  People around you feel you are not acting correctly or are confused.  Shortness of breath or difficulty breathing.  Dizziness and fainting.  You get a rash or develop hives.  You have a decrease in urine output.  Your urine turns a dark color or changes to pink, red, or brown. Any of the following symptoms occur over the next 10 days:  You have a temperature by mouth above 102 F (38.9 C), not controlled by medicine.  Shortness of breath.  Weakness after normal activity.  The white part of the eye turns yellow (jaundice).  You have a decrease in the amount of urine or are urinating less often.  Your urine turns a dark color or changes to pink, red, or brown. Document Released: 05/02/2000 Document Revised: 07/28/2011 Document Reviewed: 12/20/2007 [facility] Patient Information 2014 [place], [place].  _______________________________________________________________________

## 2015-12-07 ENCOUNTER — Encounter (HOSPITAL_COMMUNITY)
Admission: RE | Admit: 2015-12-07 | Discharge: 2015-12-07 | Disposition: A | Discharge status: Home / Self Care | Payer: 59 | Source: Ambulatory Visit

## 2015-12-07 NOTE — Pre-Procedure Instructions (Signed)
EKG 07-24-15 epic Echo 08-02-15 epic LOV Cardiology, Dr. Wyline Mood, 10-29-15 epic

## 2015-12-11 ENCOUNTER — Encounter (INDEPENDENT_AMBULATORY_CARE_PROVIDER_SITE_OTHER): Payer: Self-pay

## 2015-12-11 ENCOUNTER — Encounter (HOSPITAL_COMMUNITY)
Admission: RE | Admit: 2015-12-11 | Discharge: 2015-12-11 | Disposition: A | Discharge status: Home / Self Care | Payer: 59 | Source: Ambulatory Visit | Attending: Orthopaedic Surgery | Admitting: Orthopaedic Surgery

## 2015-12-11 ENCOUNTER — Encounter (HOSPITAL_COMMUNITY): Payer: Self-pay

## 2015-12-11 HISTORY — DX: Other intervertebral disc displacement, lumbar region: M51.26

## 2015-12-11 HISTORY — DX: Unspecified osteoarthritis, unspecified site: M19.90

## 2015-12-11 LAB — BASIC METABOLIC PANEL
ANION GAP: 7 (ref 5–15)
BUN: 16 mg/dL (ref 6–20)
CHLORIDE: 104 mmol/L (ref 101–111)
CO2: 26 mmol/L (ref 22–32)
Calcium: 9.4 mg/dL (ref 8.9–10.3)
Creatinine, Ser: 0.97 mg/dL (ref 0.61–1.24)
GFR calc non Af Amer: >60 mL/min (ref >60)
GLUCOSE: 92 mg/dL (ref 65–99)
Potassium: 4.2 mmol/L (ref 3.5–5.1)
Sodium: 137 mmol/L (ref 135–145)

## 2015-12-11 LAB — SURGICAL PCR SCREEN
MRSA, PCR: NEGATIVE
Staphylococcus aureus: POSITIVE — AB

## 2015-12-11 LAB — CBC
HEMATOCRIT: 38.4 % — AB (ref 39.0–52.0)
HEMOGLOBIN: 13.9 g/dL (ref 13.0–17.0)
MCH: 31.2 pg (ref 26.0–34.0)
MCHC: 36.2 g/dL — ABNORMAL HIGH (ref 30.0–36.0)
MCV: 86.1 fL (ref 78.0–100.0)
Platelets: 230 10*3/uL (ref 150–400)
RBC: 4.46 MIL/uL (ref 4.22–5.81)
RDW: 12.5 % (ref 11.5–15.5)
WBC: 4.9 10*3/uL (ref 4.0–10.5)

## 2015-12-11 NOTE — Progress Notes (Signed)
Clearance dr branch per phone note 11/08/15

## 2015-12-11 NOTE — Patient Instructions (Addendum)
Tony Reynolds  12/11/2015   Your procedure is scheduled on: 12/14/15  Report to [facility] Main  Entrance take [facility]  elevators to 3rd floor to  Short Stay Center at 1000 AM.  Call this number if you have problems the morning of surgery [phone]   Remember: ONLY 1 PERSON MAY GO WITH YOU TO SHORT STAY TO GET  READY MORNING OF YOUR SURGERY.  Do not eat food or drink liquids :After Midnight.     Take these medicines the morning of surgery with A SIP OF WATER:NONE  May take Hydrocodone if needed                                You may not have any metal on your body including hair pins and              piercings  Do not wear jewelry, make-up, lotions, powders or perfumes, deodorant             Do not wear nail polish.  Do not shave  48 hours prior to surgery.              Men may shave face and neck.   Do not bring valuables to the hospital. [place] IS NOT             RESPONSIBLE   FOR VALUABLES.  Contacts, dentures or bridgework may not be worn into surgery.  Leave suitcase in the car. After surgery it may be brought to your room.    _____________________________________________________________________             [facility] - Preparing for Surgery Before surgery, you can play an important role.  Because skin is not sterile, your skin needs to be as free of germs as possible.  You can reduce the number of germs on your skin by washing with CHG (chlorahexidine gluconate) soap before surgery.  CHG is an antiseptic cleaner which kills germs and bonds with the skin to continue killing germs even after washing. Please DO NOT use if you have an allergy to CHG or antibacterial soaps.  If your skin becomes reddened/irritated stop using the CHG and inform your nurse when you arrive at Short Stay. Do not shave (including legs and underarms) for at least 48 hours prior to the first CHG shower.  You may shave your face/neck. Please follow these instructions  carefully:  1.  Shower with CHG Soap the night before surgery and the  morning of Surgery.  2.  If you choose to wash your hair, wash your hair first as usual with your  normal  shampoo.  3.  After you shampoo, rinse your hair and body thoroughly to remove the  shampoo.                           4.  Use CHG as you would any other liquid soap.  You can apply chg directly  to the skin and wash                       Gently with a scrungie or clean washcloth.  5.  Apply the CHG Soap to your body ONLY FROM THE NECK DOWN.   Do not use on face/ open  Wound or open sores. Avoid contact with eyes, ears mouth and genitals (private parts).                       Wash face,  Genitals (private parts) with your normal soap.             6.  Wash thoroughly, paying special attention to the area where your surgery  will be performed.  7.  Thoroughly rinse your body with warm water from the neck down.  8.  DO NOT shower/wash with your normal soap after using and rinsing off  the CHG Soap.                9.  Pat yourself dry with a clean towel.            10.  Wear clean pajamas.            11.  Place clean sheets on your bed the night of your first shower and do not  sleep with pets. Day of Surgery : Do not apply any lotions/deodorants the morning of surgery.  Please wear clean clothes to the hospital/surgery center.  FAILURE TO FOLLOW THESE INSTRUCTIONS MAY RESULT IN THE CANCELLATION OF YOUR SURGERY PATIENT SIGNATURE_________________________________  NURSE SIGNATURE__________________________________  ________________________________________________________________________

## 2015-12-12 LAB — ABO/RH: ABO/RH(D): O POS

## 2015-12-14 ENCOUNTER — Inpatient Hospital Stay (HOSPITAL_COMMUNITY): Payer: 59

## 2015-12-14 ENCOUNTER — Inpatient Hospital Stay (HOSPITAL_COMMUNITY): Payer: 59 | Admitting: Anesthesiology

## 2015-12-14 ENCOUNTER — Encounter (HOSPITAL_COMMUNITY)
Admission: RE | Disposition: A | Discharge status: Home Health | Payer: Self-pay | Source: Ambulatory Visit | Attending: Orthopaedic Surgery

## 2015-12-14 ENCOUNTER — Encounter (HOSPITAL_COMMUNITY): Payer: Self-pay | Admitting: *Deleted

## 2015-12-14 ENCOUNTER — Inpatient Hospital Stay (HOSPITAL_COMMUNITY)
Admission: RE | Admit: 2015-12-14 | Discharge: 2015-12-18 | DRG: 462 | Disposition: A | Discharge status: Home Health | Payer: 59 | Source: Ambulatory Visit | Attending: Orthopaedic Surgery | Admitting: Orthopaedic Surgery

## 2015-12-14 DIAGNOSIS — M87052 Idiopathic aseptic necrosis of left femur: Secondary | ICD-10-CM | POA: Diagnosis present

## 2015-12-14 DIAGNOSIS — D62 Acute posthemorrhagic anemia: Secondary | ICD-10-CM | POA: Diagnosis not present

## 2015-12-14 DIAGNOSIS — F17210 Nicotine dependence, cigarettes, uncomplicated: Secondary | ICD-10-CM | POA: Diagnosis present

## 2015-12-14 DIAGNOSIS — M25559 Pain in unspecified hip: Secondary | ICD-10-CM | POA: Diagnosis present

## 2015-12-14 DIAGNOSIS — Z419 Encounter for procedure for purposes other than remedying health state, unspecified: Secondary | ICD-10-CM

## 2015-12-14 DIAGNOSIS — M87051 Idiopathic aseptic necrosis of right femur: Principal | ICD-10-CM | POA: Diagnosis present

## 2015-12-14 DIAGNOSIS — Z96643 Presence of artificial hip joint, bilateral: Secondary | ICD-10-CM

## 2015-12-14 DIAGNOSIS — I10 Essential (primary) hypertension: Secondary | ICD-10-CM | POA: Diagnosis present

## 2015-12-14 HISTORY — PX: BILATERAL ANTERIOR TOTAL HIP ARTHROPLASTY: SHX5567

## 2015-12-14 LAB — TYPE AND SCREEN
ABO/RH(D): O POS
Antibody Screen: NEGATIVE

## 2015-12-14 SURGERY — ARTHROPLASTY, HIP, BILATERAL, TOTAL, ANTERIOR APPROACH
Anesthesia: General | Site: Hip | Laterality: Bilateral

## 2015-12-14 MED ORDER — SUGAMMADEX SODIUM 200 MG/2ML IV SOLN
INTRAVENOUS | Status: DC | PRN
  Administered 2015-12-14: 200 mg via INTRAVENOUS

## 2015-12-14 MED ORDER — DIPHENHYDRAMINE HCL 12.5 MG/5ML PO ELIX: 12.5000 mg | ORAL_SOLUTION | ORAL | Status: DC | PRN

## 2015-12-14 MED ORDER — LACTATED RINGERS IV SOLN
INTRAVENOUS | Status: DC
  Administered 2015-12-14 (×2): via INTRAVENOUS
  Administered 2015-12-14: 1000 mL via INTRAVENOUS

## 2015-12-14 MED ORDER — OXYCODONE HCL 5 MG PO TABS
5.0000 mg | ORAL_TABLET | ORAL | Status: DC | PRN
  Administered 2015-12-14 (×2): 5 mg via ORAL
  Administered 2015-12-15 (×6): 10 mg via ORAL
  Filled 2015-12-14 (×2): qty 2
  Filled 2015-12-14 (×2): qty 1
  Filled 2015-12-14 (×4): qty 2

## 2015-12-14 MED ORDER — PROPOFOL 10 MG/ML IV BOLUS
INTRAVENOUS | Status: AC
  Filled 2015-12-14: qty 20

## 2015-12-14 MED ORDER — ROCURONIUM BROMIDE 100 MG/10ML IV SOLN
INTRAVENOUS | Status: DC | PRN
  Administered 2015-12-14: 20 mg via INTRAVENOUS
  Administered 2015-12-14: 10 mg via INTRAVENOUS
  Administered 2015-12-14: 20 mg via INTRAVENOUS
  Administered 2015-12-14: 50 mg via INTRAVENOUS

## 2015-12-14 MED ORDER — KETOROLAC TROMETHAMINE 15 MG/ML IJ SOLN
7.5000 mg | Freq: Four times a day (QID) | INTRAMUSCULAR | Status: AC
  Administered 2015-12-14 – 2015-12-15 (×4): 7.5 mg via INTRAVENOUS
  Filled 2015-12-14 (×5): qty 1

## 2015-12-14 MED ORDER — MIDAZOLAM HCL 2 MG/2ML IJ SOLN
INTRAMUSCULAR | Status: DC | PRN
  Administered 2015-12-14: 2 mg via INTRAVENOUS

## 2015-12-14 MED ORDER — METOCLOPRAMIDE HCL 5 MG PO TABS: 5.0000 mg | ORAL_TABLET | Freq: Three times a day (TID) | ORAL | Status: DC | PRN

## 2015-12-14 MED ORDER — CEFAZOLIN SODIUM-DEXTROSE 2-4 GM/100ML-% IV SOLN
2.0000 g | INTRAVENOUS | Status: AC
  Administered 2015-12-14: 2 g via INTRAVENOUS
  Filled 2015-12-14: qty 100

## 2015-12-14 MED ORDER — ACETAMINOPHEN 10 MG/ML IV SOLN
INTRAVENOUS | Status: AC
  Filled 2015-12-14: qty 100

## 2015-12-14 MED ORDER — ONDANSETRON HCL 4 MG/2ML IJ SOLN: 4.0000 mg | Freq: Four times a day (QID) | INTRAMUSCULAR | Status: DC | PRN

## 2015-12-14 MED ORDER — HYDROMORPHONE HCL 1 MG/ML IJ SOLN
1.0000 mg | INTRAMUSCULAR | Status: DC | PRN
  Administered 2015-12-15 (×3): 1 mg via INTRAVENOUS
  Filled 2015-12-14 (×3): qty 1

## 2015-12-14 MED ORDER — PHENYLEPHRINE 40 MCG/ML (10ML) SYRINGE FOR IV PUSH (FOR BLOOD PRESSURE SUPPORT)
PREFILLED_SYRINGE | INTRAVENOUS | Status: AC
  Filled 2015-12-14: qty 10

## 2015-12-14 MED ORDER — DEXAMETHASONE SODIUM PHOSPHATE 10 MG/ML IJ SOLN
INTRAMUSCULAR | Status: DC | PRN
  Administered 2015-12-14: 10 mg via INTRAVENOUS

## 2015-12-14 MED ORDER — ASPIRIN EC 325 MG PO TBEC
325.0000 mg | DELAYED_RELEASE_TABLET | Freq: Two times a day (BID) | ORAL | Status: DC
  Administered 2015-12-15 – 2015-12-18 (×7): 325 mg via ORAL
  Filled 2015-12-14 (×7): qty 1

## 2015-12-14 MED ORDER — MEPERIDINE HCL 50 MG/ML IJ SOLN: 6.2500 mg | INTRAMUSCULAR | Status: DC | PRN

## 2015-12-14 MED ORDER — SODIUM CHLORIDE 0.9 % IV SOLN
1000.0000 mg | INTRAVENOUS | Status: AC
  Administered 2015-12-14: 1000 mg via INTRAVENOUS
  Filled 2015-12-14: qty 10

## 2015-12-14 MED ORDER — ONDANSETRON HCL 4 MG PO TABS: 4.0000 mg | ORAL_TABLET | Freq: Four times a day (QID) | ORAL | Status: DC | PRN

## 2015-12-14 MED ORDER — MENTHOL 3 MG MT LOZG: 1.0000 | LOZENGE | OROMUCOSAL | Status: DC | PRN

## 2015-12-14 MED ORDER — SUGAMMADEX SODIUM 200 MG/2ML IV SOLN
INTRAVENOUS | Status: AC
  Filled 2015-12-14: qty 2

## 2015-12-14 MED ORDER — DEXTROSE 5 % IV SOLN
500.0000 mg | Freq: Four times a day (QID) | INTRAVENOUS | Status: DC | PRN
  Administered 2015-12-14: 500 mg via INTRAVENOUS
  Filled 2015-12-14: qty 5
  Filled 2015-12-14: qty 550

## 2015-12-14 MED ORDER — HYDROMORPHONE HCL 2 MG/ML IJ SOLN
INTRAMUSCULAR | Status: AC
  Filled 2015-12-14: qty 1

## 2015-12-14 MED ORDER — PROMETHAZINE HCL 25 MG/ML IJ SOLN: 6.2500 mg | INTRAMUSCULAR | Status: DC | PRN

## 2015-12-14 MED ORDER — SODIUM CHLORIDE 0.9 % IR SOLN
Status: DC | PRN
  Administered 2015-12-14 (×2): 1000 mL

## 2015-12-14 MED ORDER — METOCLOPRAMIDE HCL 5 MG/ML IJ SOLN: 5.0000 mg | Freq: Three times a day (TID) | INTRAMUSCULAR | Status: DC | PRN

## 2015-12-14 MED ORDER — PANTOPRAZOLE SODIUM 40 MG PO TBEC
40.0000 mg | DELAYED_RELEASE_TABLET | Freq: Every day | ORAL | Status: DC
  Administered 2015-12-14 – 2015-12-18 (×4): 40 mg via ORAL
  Filled 2015-12-14 (×4): qty 1

## 2015-12-14 MED ORDER — KETAMINE HCL 10 MG/ML IJ SOLN
INTRAMUSCULAR | Status: AC
  Filled 2015-12-14: qty 1

## 2015-12-14 MED ORDER — ONDANSETRON HCL 4 MG/2ML IJ SOLN
INTRAMUSCULAR | Status: DC | PRN
  Administered 2015-12-14: 4 mg via INTRAVENOUS

## 2015-12-14 MED ORDER — KETAMINE HCL 10 MG/ML IJ SOLN
INTRAMUSCULAR | Status: DC | PRN
  Administered 2015-12-14: 20 mg via INTRAVENOUS
  Administered 2015-12-14 (×3): 10 mg via INTRAVENOUS
  Administered 2015-12-14: 20 mg via INTRAVENOUS

## 2015-12-14 MED ORDER — FENTANYL CITRATE (PF) 250 MCG/5ML IJ SOLN
INTRAMUSCULAR | Status: AC
  Filled 2015-12-14: qty 5

## 2015-12-14 MED ORDER — SODIUM CHLORIDE 0.9 % IJ SOLN
INTRAMUSCULAR | Status: AC
  Filled 2015-12-14: qty 10

## 2015-12-14 MED ORDER — ACETAMINOPHEN 10 MG/ML IV SOLN
1000.0000 mg | Freq: Once | INTRAVENOUS | Status: AC
  Administered 2015-12-14: 1000 mg via INTRAVENOUS

## 2015-12-14 MED ORDER — ONDANSETRON HCL 4 MG/2ML IJ SOLN
INTRAMUSCULAR | Status: AC
  Filled 2015-12-14: qty 2

## 2015-12-14 MED ORDER — ROCURONIUM BROMIDE 100 MG/10ML IV SOLN
INTRAVENOUS | Status: AC
  Filled 2015-12-14: qty 1

## 2015-12-14 MED ORDER — MIDAZOLAM HCL 2 MG/2ML IJ SOLN
INTRAMUSCULAR | Status: AC
  Filled 2015-12-14: qty 2

## 2015-12-14 MED ORDER — HYDROMORPHONE HCL 1 MG/ML IJ SOLN
0.2500 mg | INTRAMUSCULAR | Status: DC | PRN
  Administered 2015-12-14: 0.25 mg via INTRAVENOUS
  Administered 2015-12-14: 0.5 mg via INTRAVENOUS

## 2015-12-14 MED ORDER — LIDOCAINE HCL (CARDIAC) 20 MG/ML IV SOLN
INTRAVENOUS | Status: DC | PRN
  Administered 2015-12-14: 100 mg via INTRAVENOUS

## 2015-12-14 MED ORDER — PROPOFOL 10 MG/ML IV BOLUS
INTRAVENOUS | Status: DC | PRN
  Administered 2015-12-14: 50 mg via INTRAVENOUS
  Administered 2015-12-14: 150 mg via INTRAVENOUS

## 2015-12-14 MED ORDER — FENTANYL CITRATE (PF) 100 MCG/2ML IJ SOLN
INTRAMUSCULAR | Status: DC | PRN
  Administered 2015-12-14: 150 ug via INTRAVENOUS
  Administered 2015-12-14 (×3): 50 ug via INTRAVENOUS
  Administered 2015-12-14: 100 ug via INTRAVENOUS
  Administered 2015-12-14 (×2): 50 ug via INTRAVENOUS

## 2015-12-14 MED ORDER — LIDOCAINE HCL (CARDIAC) 20 MG/ML IV SOLN
INTRAVENOUS | Status: AC
  Filled 2015-12-14: qty 5

## 2015-12-14 MED ORDER — SODIUM CHLORIDE 0.9 % IV SOLN
INTRAVENOUS | Status: DC
  Administered 2015-12-14: 17:00:00 via INTRAVENOUS

## 2015-12-14 MED ORDER — ZOLPIDEM TARTRATE 5 MG PO TABS: 5.0000 mg | ORAL_TABLET | Freq: Every evening | ORAL | Status: DC | PRN

## 2015-12-14 MED ORDER — CEFAZOLIN SODIUM-DEXTROSE 2-4 GM/100ML-% IV SOLN
INTRAVENOUS | Status: AC
  Filled 2015-12-14: qty 100

## 2015-12-14 MED ORDER — ACETAMINOPHEN 650 MG RE SUPP: 650.0000 mg | Freq: Four times a day (QID) | RECTAL | Status: DC | PRN

## 2015-12-14 MED ORDER — AMLODIPINE BESYLATE 10 MG PO TABS
10.0000 mg | ORAL_TABLET | Freq: Every day | ORAL | Status: DC
  Administered 2015-12-15 – 2015-12-18 (×4): 10 mg via ORAL
  Filled 2015-12-14 (×4): qty 1

## 2015-12-14 MED ORDER — ACETAMINOPHEN 325 MG PO TABS
650.0000 mg | ORAL_TABLET | Freq: Four times a day (QID) | ORAL | Status: DC | PRN
  Administered 2015-12-15: 650 mg via ORAL
  Filled 2015-12-14: qty 2

## 2015-12-14 MED ORDER — HYDROMORPHONE HCL 1 MG/ML IJ SOLN
INTRAMUSCULAR | Status: DC | PRN
  Administered 2015-12-14: .6 mg via INTRAVENOUS
  Administered 2015-12-14 (×2): .5 mg via INTRAVENOUS
  Administered 2015-12-14: .4 mg via INTRAVENOUS

## 2015-12-14 MED ORDER — DOCUSATE SODIUM 100 MG PO CAPS
100.0000 mg | ORAL_CAPSULE | Freq: Two times a day (BID) | ORAL | Status: DC
  Administered 2015-12-14 – 2015-12-18 (×8): 100 mg via ORAL
  Filled 2015-12-14 (×8): qty 1

## 2015-12-14 MED ORDER — MIDAZOLAM HCL 2 MG/2ML IJ SOLN
0.5000 mg | Freq: Once | INTRAMUSCULAR | Status: AC | PRN
  Administered 2015-12-14: 0.5 mg via INTRAVENOUS

## 2015-12-14 MED ORDER — PHENOL 1.4 % MT LIQD
1.0000 | OROMUCOSAL | Status: DC | PRN
Start: 2015-12-14 — End: 2015-12-18
  Filled 2015-12-14: qty 177

## 2015-12-14 MED ORDER — CEFAZOLIN IN D5W 1 GM/50ML IV SOLN
1.0000 g | Freq: Four times a day (QID) | INTRAVENOUS | Status: AC
  Administered 2015-12-14 – 2015-12-15 (×2): 1 g via INTRAVENOUS
  Filled 2015-12-14 (×2): qty 50

## 2015-12-14 MED ORDER — HYDROMORPHONE HCL 1 MG/ML IJ SOLN
INTRAMUSCULAR | Status: AC
  Filled 2015-12-14: qty 1

## 2015-12-14 MED ORDER — ALUM & MAG HYDROXIDE-SIMETH 200-200-20 MG/5ML PO SUSP
30.0000 mL | ORAL | Status: DC | PRN
  Administered 2015-12-16: 30 mL via ORAL
  Filled 2015-12-14: qty 30

## 2015-12-14 MED ORDER — METHOCARBAMOL 500 MG PO TABS
500.0000 mg | ORAL_TABLET | Freq: Four times a day (QID) | ORAL | Status: DC | PRN
  Administered 2015-12-15 – 2015-12-18 (×12): 500 mg via ORAL
  Filled 2015-12-14 (×12): qty 1

## 2015-12-14 SURGICAL SUPPLY — 46 items
BAG ZIPLOCK 12X15 (MISCELLANEOUS) IMPLANT
BENZOIN TINCTURE PRP APPL 2/3 (GAUZE/BANDAGES/DRESSINGS) ×6 IMPLANT
BLADE SAW SGTL 18X1.27X75 (BLADE) ×4 IMPLANT
BLADE SAW SGTL 18X1.27X75MM (BLADE) ×2
BLADE SURG SZ10 CARB STEEL (BLADE) ×12 IMPLANT
CAPT HIP TOTAL 2 ×6 IMPLANT
CELLS DAT CNTRL 66122 CELL SVR (MISCELLANEOUS) ×3 IMPLANT
CLOSURE WOUND 1/2 X4 (GAUZE/BANDAGES/DRESSINGS) ×2
CLOTH BEACON ORANGE TIMEOUT ST (SAFETY) ×6 IMPLANT
COVER PERINEAL POST (MISCELLANEOUS) ×3 IMPLANT
DRAPE C-ARM 42X120 X-RAY (DRAPES) ×6 IMPLANT
DRAPE STERI IOBAN 125X83 (DRAPES) ×6 IMPLANT
DRAPE U-SHAPE 47X51 STRL (DRAPES) ×12 IMPLANT
DRSG AQUACEL AG ADV 3.5X10 (GAUZE/BANDAGES/DRESSINGS) ×6 IMPLANT
DURAPREP 26ML APPLICATOR (WOUND CARE) ×3 IMPLANT
ELECT BLADE TIP CTD 4 INCH (ELECTRODE) ×9 IMPLANT
ELECT PENCIL ROCKER SW 15FT (MISCELLANEOUS) ×6 IMPLANT
ELECT REM PT RETURN 9FT ADLT (ELECTROSURGICAL) ×3
ELECTRODE REM PT RTRN 9FT ADLT (ELECTROSURGICAL) ×1 IMPLANT
EVACUATOR 1/8 PVC DRAIN (DRAIN) IMPLANT
FACESHIELD WRAPAROUND (MASK) ×12 IMPLANT
GLOVE BIO SURGEON STRL SZ7.5 (GLOVE) ×6 IMPLANT
GLOVE BIOGEL PI IND STRL 8 (GLOVE) ×4 IMPLANT
GLOVE BIOGEL PI INDICATOR 8 (GLOVE) ×8
GLOVE ECLIPSE 8.0 STRL XLNG CF (GLOVE) ×6 IMPLANT
GOWN STRL REUS W/TWL XL LVL3 (GOWN DISPOSABLE) ×12 IMPLANT
HANDPIECE INTERPULSE COAX TIP (DISPOSABLE) ×4
LIQUID BAND (GAUZE/BANDAGES/DRESSINGS) IMPLANT
MARKER SKIN DUAL TIP RULER LAB (MISCELLANEOUS) ×3 IMPLANT
PACK ANTERIOR HIP CUSTOM (KITS) ×3 IMPLANT
PACK UNIVERSAL I (CUSTOM PROCEDURE TRAY) ×3 IMPLANT
RTRCTR WOUND ALEXIS 18CM MED (MISCELLANEOUS) ×9
SET HNDPC FAN SPRY TIP SCT (DISPOSABLE) ×2 IMPLANT
SPONGE LAP 18X18 X RAY DECT (DISPOSABLE) ×6 IMPLANT
STAPLER VISISTAT 35W (STAPLE) IMPLANT
STRIP CLOSURE SKIN 1/2X4 (GAUZE/BANDAGES/DRESSINGS) ×4 IMPLANT
SUT ETHIBOND NAB CT1 #1 30IN (SUTURE) ×6 IMPLANT
SUT MNCRL AB 4-0 PS2 18 (SUTURE) ×6 IMPLANT
SUT VIC AB 0 CT1 36 (SUTURE) ×6 IMPLANT
SUT VIC AB 1 CT1 36 (SUTURE) ×6 IMPLANT
SUT VIC AB 2-0 CT1 27 (SUTURE) ×8
SUT VIC AB 2-0 CT1 TAPERPNT 27 (SUTURE) ×4 IMPLANT
TOWEL OR 17X26 10 PK STRL BLUE (TOWEL DISPOSABLE) IMPLANT
TRAY FOLEY W/METER SILVER 14FR (SET/KITS/TRAYS/PACK) IMPLANT
TRAY FOLEY W/METER SILVER 16FR (SET/KITS/TRAYS/PACK) ×3 IMPLANT
YANKAUER SUCT BULB TIP 10FT TU (MISCELLANEOUS) ×3 IMPLANT

## 2015-12-14 NOTE — Transfer of Care (Signed)
Immediate Anesthesia Transfer of Care Note  Patient: Tony Reynolds  Procedure(s) Performed: Procedure(s): BILATERAL ANTERIOR TOTAL HIP ARTHROPLASTY (Bilateral)  Patient Location: PACU  Anesthesia Type:General  Level of Consciousness: awake and oriented  Airway & Oxygen Therapy: Patient Spontanous Breathing and Patient connected to face mask oxygen  Post-op Assessment: Report given to RN and Post -op Vital signs reviewed and stable  Post vital signs: Reviewed and stable  Last Vitals:  Vitals:   12/14/15 0955  BP: (!) 135/93  Pulse: (!) 59  Resp: 18  Temp: 36.7 C    Last Pain:  Vitals:   12/14/15 0955  TempSrc: Oral      Patients Stated Pain Goal: 4 (12/14/15 1116)  Complications: No apparent anesthesia complications

## 2015-12-14 NOTE — Anesthesia Procedure Notes (Signed)
Procedure Name: Intubation Date/Time: 12/14/2015 1:02 PM Performed by: Leroy Libman L Patient Re-evaluated:Patient Re-evaluated prior to inductionOxygen Delivery Method: Circle system utilized Preoxygenation: Pre-oxygenation with 100% oxygen Intubation Type: IV induction Ventilation: Mask ventilation without difficulty and Oral airway inserted - appropriate to patient size Laryngoscope Size: Miller and 3 Grade View: Grade I Tube type: Oral Tube size: 8.0 mm Number of attempts: 1 Airway Equipment and Method: Stylet Placement Confirmation: ETT inserted through vocal cords under direct vision,  positive ETCO2 and breath sounds checked- equal and bilateral Secured at: 23 cm Tube secured with: Tape Dental Injury: Teeth and Oropharynx as per pre-operative assessment

## 2015-12-14 NOTE — H&P (Signed)
TOTAL HIP ADMISSION H&P  Patient is admitted for bilaterally total hip arthroplasty.  Subjective:  Chief Complaint: bilaterally hip pain  HPI: Tony Reynolds, 45 y.o. male, has a history of pain and functional disability in the bilaterally hip(s) due to idiopathic avascular necrosis and patient has failed non-surgical conservative treatments for greater than 12 weeks to include NSAID's and/or analgesics, flexibility and strengthening excercises and activity modification.  Onset of symptoms was abrupt starting 2 years ago with rapidlly worsening course since that time.The patient noted no past surgery on the bilaterally hip(s).  Patient currently rates pain in the bilaterally hip at 10 out of 10 with activity. Patient has night pain, worsening of pain with activity and weight bearing, trendelenberg gait, pain that interfers with activities of daily living and pain with passive range of motion. Patient has evidence of subchondral cysts by imaging studies. This condition presents safety issues increasing the risk of falls.  There is no current active infection.  Patient Active Problem List   Diagnosis Date Noted  . 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 Arthritis   . Back pain   . Eczema   . Eczema   . Hypertension   . Lumbar disc herniation     Past Surgical History:  Procedure Laterality Date  . NO PAST SURGERIES      No prescriptions prior to admission.   Allergies  Allergen Reactions  . Tomato     No acidic foods    Social History  Substance Use Topics  . Smoking status: Current Every Day Smoker    Packs/day: 0.50    Types: Cigarettes    Start date: 04/01/1989  . Smokeless tobacco: Never Used     Comment: 10 ciggs per day  . Alcohol use 0.0 oz/week     Comment: 14  total  of 12 oz beer weekly    No family history on file.   Review of Systems   Musculoskeletal: Positive for joint pain.  All other systems reviewed and are negative.   Objective:  Physical Exam  Constitutional: He is oriented to person, place, and time. He appears well-developed and well-nourished.  HENT:  Head: Normocephalic and atraumatic.  Eyes: EOM are normal. Pupils are equal, round, and reactive to light.  Neck: Normal range of motion. Neck supple.  Cardiovascular: Normal rate and regular rhythm.   Respiratory: Effort normal and breath sounds normal.  GI: Soft. Bowel sounds are normal.  Musculoskeletal:       Right hip: He exhibits decreased range of motion, decreased strength, tenderness, bony tenderness and crepitus.       Left hip: He exhibits decreased range of motion, decreased strength, tenderness, bony tenderness and crepitus.  Neurological: He is alert and oriented to person, place, and time.  Skin: Skin is warm and dry.  Psychiatric: He has a normal mood and affect.    Vital signs in last 24 hours:    Labs:   Estimated body mass index is 27.6 kg/m as calculated from the following:   Height as of 12/11/15: 5\' 6"  (1.676 m).   Weight as of 12/11/15: 77.6 kg (171 lb).   Imaging Review Plain radiographs demonstrate severe avascular necrosis of the bilateral hip(s). The bone quality appears to be good for age and reported activity level.  Assessment/Plan:  End stage AVN, bilaterally hip(s)  The patient history,  physical examination, clinical judgement of the provider and imaging studies are consistent with end stage avascular necrosis of the bilaterally hip(s) and total hip arthroplasty is deemed medically necessary. The treatment options including medical management, injection therapy, arthroscopy and arthroplasty were discussed at length. The risks and benefits of total hip arthroplasty were presented and reviewed. The risks due to aseptic loosening, infection, stiffness, dislocation/subluxation,  thromboembolic complications and other  imponderables were discussed.  The patient acknowledged the explanation, agreed to proceed with the plan and consent was signed. Patient is being admitted for inpatient treatment for surgery, pain control, PT, OT, prophylactic antibiotics, VTE prophylaxis, progressive ambulation and ADL's and discharge planning.The patient is planning to be discharged home with home health services

## 2015-12-14 NOTE — Brief Op Note (Signed)
12/14/2015  3:35 PM  PATIENT:  Tony Reynolds  45 y.o. male  PRE-OPERATIVE DIAGNOSIS:  bilateral hip avascular necrosis  POST-OPERATIVE DIAGNOSIS:  bilateral hip avascular necrosis  PROCEDURE:  Procedure(s): BILATERAL ANTERIOR TOTAL HIP ARTHROPLASTY (Bilateral)  SURGEON:  Surgeon(s) and Role:    * Kathryne Hitch, MD - Primary  PHYSICIAN ASSISTANT: Rexene Edison, PA-C  ANESTHESIA:   general  EBL:  Total I/O In: 2000 [I.V.:2000] Out: 725 [Urine:275; Blood:450]  COUNTS:  YES  TOURNIQUET:  * No tourniquets in log *  DICTATION: .Other Dictation: Dictation Number [id number]  PLAN OF CARE: Admit to inpatient   PATIENT DISPOSITION:  PACU - hemodynamically stable.   Delay start of Pharmacological VTE agent (>24hrs) due to surgical blood loss or risk of bleeding: no

## 2015-12-14 NOTE — Anesthesia Postprocedure Evaluation (Signed)
Anesthesia Post Note  Patient: Tony Reynolds  Procedure(s) Performed: Procedure(s) (LRB): BILATERAL ANTERIOR TOTAL HIP ARTHROPLASTY (Bilateral)  Patient location during evaluation: PACU Anesthesia Type: General Level of consciousness: awake and alert and patient cooperative Pain management: pain level controlled Vital Signs Assessment: post-procedure vital signs reviewed and stable Respiratory status: spontaneous breathing, nonlabored ventilation, respiratory function stable and patient connected to nasal cannula oxygen Cardiovascular status: blood pressure returned to baseline and stable Postop Assessment: no signs of nausea or vomiting Anesthetic complications: no    Last Vitals:  Vitals:   12/14/15 1630 12/14/15 1700  BP: (!) 146/81   Pulse: 81   Resp: 16   Temp:  36.8 C    Last Pain:  Vitals:   12/14/15 1700  TempSrc:   PainSc: Asleep                 Emberlynn Riggan,E. Xiamara Hulet

## 2015-12-14 NOTE — Anesthesia Preprocedure Evaluation (Addendum)
Anesthesia Evaluation  Patient identified by MRN, date of birth, ID band Patient awake    Reviewed: Allergy & Precautions, NPO status , Patient's Chart, lab work & pertinent test results  History of Anesthesia Complications Negative for: history of anesthetic complications  Airway Mallampati: II  TM Distance: >3 FB Neck ROM: Full    Dental  (+) Dental Advisory Given   Pulmonary Current Smoker,    breath sounds clear to auscultation       Cardiovascular hypertension, Pt. on medications  Rhythm:Regular Rate:Normal  3/17 ECHO:  EF 60-65%, valves OK   Neuro/Psych negative neurological ROS     GI/Hepatic negative GI ROS, Neg liver ROS,   Endo/Other  negative endocrine ROS  Renal/GU negative Renal ROS     Musculoskeletal  (+) Arthritis , Osteoarthritis,    Abdominal   Peds  Hematology negative hematology ROS (+)   Anesthesia Other Findings   Reproductive/Obstetrics                            Anesthesia Physical Anesthesia Plan  ASA: II  Anesthesia Plan: General   Post-op Pain Management:    Induction: Intravenous  Airway Management Planned: Oral ETT  Additional Equipment:   Intra-op Plan:   Post-operative Plan: Extubation in OR  Informed Consent: I have reviewed the patients History and Physical, chart, labs and discussed the procedure including the risks, benefits and alternatives for the proposed anesthesia with the patient or authorized representative who has indicated his/her understanding and acceptance.   Dental advisory given  Plan Discussed with: CRNA and Surgeon  Anesthesia Plan Comments: (Plan routine monitors, GETA)        Anesthesia Quick Evaluation

## 2015-12-15 LAB — BASIC METABOLIC PANEL
Anion gap: 7 (ref 5–15)
BUN: 10 mg/dL (ref 6–20)
CHLORIDE: 101 mmol/L (ref 101–111)
CO2: 24 mmol/L (ref 22–32)
CREATININE: 0.8 mg/dL (ref 0.61–1.24)
Calcium: 8.3 mg/dL — ABNORMAL LOW (ref 8.9–10.3)
GFR calc Af Amer: >60 mL/min (ref >60)
GFR calc non Af Amer: >60 mL/min (ref >60)
GLUCOSE: 107 mg/dL — AB (ref 65–99)
POTASSIUM: 3.9 mmol/L (ref 3.5–5.1)
SODIUM: 132 mmol/L — AB (ref 135–145)

## 2015-12-15 LAB — CBC
HCT: 27.4 % — ABNORMAL LOW (ref 39.0–52.0)
Hemoglobin: 9.9 g/dL — ABNORMAL LOW (ref 13.0–17.0)
MCH: 31 pg (ref 26.0–34.0)
MCHC: 36.1 g/dL — AB (ref 30.0–36.0)
MCV: 85.9 fL (ref 78.0–100.0)
PLATELETS: 166 10*3/uL (ref 150–400)
RBC: 3.19 MIL/uL — AB (ref 4.22–5.81)
RDW: 12.4 % (ref 11.5–15.5)
WBC: 6.2 10*3/uL (ref 4.0–10.5)

## 2015-12-15 MED ORDER — OXYCODONE HCL 5 MG PO TABS
10.0000 mg | ORAL_TABLET | ORAL | Status: DC | PRN
  Administered 2015-12-15: 20 mg via ORAL
  Administered 2015-12-16: 10 mg via ORAL
  Administered 2015-12-16: 20 mg via ORAL
  Administered 2015-12-16: 10 mg via ORAL
  Administered 2015-12-16 (×2): 20 mg via ORAL
  Administered 2015-12-16 – 2015-12-18 (×10): 10 mg via ORAL
  Administered 2015-12-18: 20 mg via ORAL
  Administered 2015-12-18: 5 mg via ORAL
  Filled 2015-12-15 (×7): qty 2
  Filled 2015-12-15: qty 4
  Filled 2015-12-15 (×3): qty 2
  Filled 2015-12-15: qty 4
  Filled 2015-12-15: qty 2
  Filled 2015-12-15: qty 4
  Filled 2015-12-15: qty 2
  Filled 2015-12-15: qty 4
  Filled 2015-12-15 (×2): qty 2
  Filled 2015-12-15 (×2): qty 4

## 2015-12-15 NOTE — Progress Notes (Signed)
Attempt by this RN to restart iv unsuccessful. Attempt by IV nurse also unsuccessful. Pt refusing treatment from staff. AC in to talk with pt.Tony Reynolds, [employer]

## 2015-12-15 NOTE — Op Note (Signed)
[name], [name] NO.:  [id number]  MEDICAL RECORD NO.:  [id number]  LOCATION:  1610                         FACILITY[facility]  [facility]  PHYSICIAN:  Tony Reynolds, Tony ReynoldsDATE OF BIRTH:  1970/09/15  DATE OF PROCEDURE:  12/14/2015 DATE OF DISCHARGE:                              OPERATIVE REPORT   PREOPERATIVE DIAGNOSIS:  Bilateral hip end-stage idiopathic avascular necrosis.  POSTOPERATIVE DIAGNOSIS:  Bilateral hip end-stage idiopathic avascular necrosis.  PROCEDURE:  Bilateral total hip arthroplasties through direct anterior approach.  IMPLANTS:  Right hip DePuy Sector Gription acetabular component size 52, size 32+ 0 neutral polyethylene liner, size 11 Corail femoral component with standard offset, size 36-2 metal hip ball.  For the left side, size 52 Sector Gription acetabular component from DePuy, size 36+ 0 neutral polyethylene liner, size 10 Corail femoral component with standard offset, size 36+ 5 ceramic hip ball.  SURGEON:  Tony Reynolds, M.D.  ASSISTANT:  Tony Canal, PA-C.  ANESTHESIA:  General.  ANTIBIOTICS:  2 g IV Ancef.  ESTIMATED BLOOD LOSS:  250 mL on the right side and 200 mL for the left side.  COMPLICATIONS:  None.  INDICATIONS:  Tony Reynolds is a 45 year old gentleman well known to me with known end-stage idiopathic avascular necrosis of both his hips.  I am saying this idiopathic avascular necrosis potentially an alcohol component related from possible years of alcohol abuse but it is not really sure if that is true history or not.  He has otherwise been a healthy individual.  His pain has been quite severe over the last 2 years of both of his hips, and it has gotten rapidly worse.  He is walking with significant limp.  He has perceived leg-length discrepancy with right being shorter than left that he feels really more dead on the knee.  Radiographs of his hips show flattening and collapse of the femoral head on the  right side with cystic changes throughout the femoral head and acetabulum.  The left side shows no collapse but cystic changes throughout the femoral head and acetabulum.  At this point, I told him about proceeding with a total hip arthroplasty on the right side.  He did wish to proceed with bilateral hips with him being healthy and otherwise young person and not a big person.  I felt comfortable proceeding with bilateral hip replacements.  PROCEDURE DESCRIPTION:  After informed consent was obtained, appropriate right and left hips were marked.  He was brought to the operating room where general anesthesia was obtained while on a stretcher.  A Foley catheter was placed and then both feet had traction boots applied to them.  Next, he was placed supine on the Hana fracture table with perineal post in place and both legs in inline skeletal traction devices and no traction applied.  Before we put the boots on his feet, I did assess his leg lengths again with him completely supine and he felt like he had equal leg lengths.  We then prepped the right hip which is most problematic hip with DuraPrep and sterile drapes.  Time-out was called. He was identified as correct patient, correct right and left hips.  We then  made an incision just inferior and posterior to the anterior superior iliac spine and carried this obliquely down the leg.  I dissected down the tensor fascia lata muscle.  Tensor fascia was then divided longitudinally, so I could proceed with direct anterior approach to the hip.  I identified and cauterized the circumflex vessels and then identified the hip capsule to open up the hip capsule in L-type format finding a large joint effusion.  I placed Cobra retractors within the joint capsule and then made our femoral neck cut proximal to the lesser trochanter using oscillating saw and completed this with an osteotome. I placed a corkscrew guide in the femoral head and removed the  femoral head in its entirety and found to have significant avascular necrosis with flaking of the cartilage.  I then placed a bent Hohmann over the medial acetabular rim and removed remnants of acetabular labrum.  I then began reaming from a size 42 reamer in 2 mm increments up to a size 52. I placed a size 52 reamer under direct visualization and fluoroscopy. Once I was pleased with this, I placed the real DePuy Sector Gription acetabular component with appropriate depth and inclination and anteversion.  I then placed the 36+ 0 neutral polyethylene liner. Attention was then turned to the femur.  With the leg externally rotated to 110 degrees, extended and adducted, we were able to place a Mueller retractor medially and a Hohmann retractor behind the greater trochanter.  I used a box cutting osteotome to enter the femoral Reynolds and a rongeur to lateralize.  We then began broaching from a size 8 and broached up to size 11.  With the size 11 in place, we could not get this down anymore.  We trialed a 36- 2 hip ball because of the tightness and reduced this in acetabulum.  I was pleased with leg length, offset, and range of motion.  We then dislocated the hip and removed the trial components.  I placed the real Corail femoral component size 11 and the real 36- 2 metal hip ball reduced this from the acetabulum, it was stable.  We copiously irrigated the soft tissue normal saline solution using pulsatile lavage.  We were able to close the joint capsule with interrupted #1 Ethibond suture then followed by running #1 Vicryl in the tensor fascia, 0 Vicryl in the deep tissue, 2-0 Vicryl in the subcutaneous tissue, and 4-0 Monocryl subcuticular stitch.  Steri-Strips were applied and Aquacel dressing was placed.  I discussed with Anesthesia staff about proceeding to the left side.  He had only had 250 mL blood loss and no complications during the case, so they felt that we could proceed to the other  side.  We kept the back table and the scrub techs sterile.  We removed the dressings and then switched things down to proceed with the left hip.  We then prepped and draped the left hip with DuraPrep and sterile drapes.  Time-out was called and he was identified as correct patient and correct left hip as well.  We then took our same approach to the left hip as we did to the right hip with dissection taking a direct anterior approach down to the tensor fascia, opened up the tensor fascia longitudinally to proceed with a direct anterior approach to the hip.  We identified and cauterized the left circumflex vessels and cauterized these.  I opened up the hip capsule in an L-type format finding a fusion on this side as well.  We placed Cobra retractors within the hip capsule and then made our femoral neck cut almost the level of the calcar on this side.  I was concerned about a lower neck cut because of his other side being so tight.  We then removed the head and found to have a significant avascular necrosis as well.  We then placed a corkscrew guide in the femoral head and removed the femoral head in its entirety.  We placed a bent Hohmann over the medial acetabular rim and cleaned the acetabular remnants and acetabular labrum.  I then was able to clean wounds remnants and acetabular labrum from the hip and began reaming under direct visualization from a size 42 reamer under 2 mm increments up to a size 52.  With a size 52, we placed under direct visualization as well, and was able to gain our depth of reaming, our inclination, and anteversion.  We then were able to place a real DePuy Sector Gription acetabular component size 52, on that side. We then placed a 36+ 0 neutral polyethylene liner, went to the femur. With the leg externally rotated to 120 degrees, extended and adducted, we were placed a Mueller retractor medially and a Hohmann retractor behind the greater trochanter.  I released  the lateral joint capsule and used a box cutting osteotome the femoral Reynolds and a rongeur to lateralize.  We were only able to broach up to a size 10 on this side because it was quite tight with the 10 broach in place, we trialed a standard offset femoral neck and a 36+ 1.5 hip ball.  We brought the leg back over and up with traction and internal rotation reducing the pelvis.  We felt like we need to get a little more leg length and offset.  So we then dislocated the hip and removed the trial components. I was able to place the real 10 Corail femoral component with standard offset and the real 36+ 5 ceramic hip ball reduced this in acetabulum it was stable.  We then copiously irrigated the soft tissue normal saline solution using pulsatile lavage.  We were able to close the joint capsule with interrupted #1 Ethibond suture followed by running #1 Vicryl in the tensor fascia, 0 Vicryl deep tissue, 2-0 Vicryl in the subcutaneous tissue, 4-0 Monocryl subcuticular stitch, and Steri-Strips on the skin.  An Aquacel dressing was applied.  He was then taken off the Hana table, awakened, extubated, and taken to the recovery room in stable condition.  All final counts were correct.  There were no complications noted.  Of note, Tony Canal, PA-C assisted in the entire case.  His assistance was crucial for facilitating all aspects of this case.     Tony Reynolds, M.D.     CYB/MEDQ  D:  12/14/2015  T:  12/15/2015  Job:  [id number]

## 2015-12-15 NOTE — Progress Notes (Signed)
Occupational Therapy Evaluation Patient Details Name: Tony Reynolds MRN: [id number] DOB: 1970-08-31 Today's Date: 12/15/2015    History of Present Illness Bil THR 2* idiopathic AVN   Clinical Impression   PTA, pt independent with ADL and mobility. Pt more painful this pm after sitting up in chair. Pt currently mod A with LB ADL and requires min A with mobility @ RW level. Will follow up tomorrow for education on ADL and funcioinal mobility for ADL. Will assess if pt needs tub bench or if he can use 3 in1 for tub transfer. Pt will have 24/7 assistance available at D/C.    Follow Up Recommendations  No OT follow up;Supervision/Assistance - 24 hour    Equipment Recommendations  3 in 1 bedside comode;Tub/shower bench (bench pending progress)    Recommendations for Other Services       Precautions / Restrictions Precautions Precautions: Fall Restrictions Weight Bearing Restrictions: No Other Position/Activity Restrictions: WBAT      Mobility Bed Mobility Overal bed mobility: Needs Assistance Bed Mobility: Sit to Supine       Sit to supine: Min assist (to lift LLE onto bed)   General bed mobility comments: cues for sequence and mod A to manage RLE  Transfers Overall transfer level: Needs assistance Equipment used: Rolling walker (2 wheeled)   Sit to Stand: Min assist;From elevated surface         General transfer comment: cues for LE management and use of UEs to self assist    Balance                                            ADL Overall ADL's : Needs assistance/impaired     Grooming: Set up;Sitting   Upper Body Bathing: Set up;Sitting   Lower Body Bathing: Moderate assistance   Upper Body Dressing : Set up;Sitting   Lower Body Dressing: Maximal assistance;Sit to/from stand   Toilet Transfer: Min guard;RW   Toileting- Architect and Hygiene: Modified independent       Functional mobility during ADLs: Min  guard;Rolling walker General ADL Comments: Began educationi regarding AE for LB ADL. Will have pt practive with AE next session. Discussed bathroom set up. May need tub bench.     Vision     Perception     Praxis      Pertinent Vitals/Pain Pain Assessment: 0-10 Pain Score: 9  Pain Location: B hips Pain Descriptors / Indicators: Aching;Burning;Constant;Grimacing;Guarding Pain Intervention(s): Limited activity within patient's tolerance;Repositioned;Ice applied     Hand Dominance Right   Extremity/Trunk Assessment Upper Extremity Assessment Upper Extremity Assessment: Overall WFL for tasks assessed   Lower Extremity Assessment Lower Extremity Assessment: Defer to PT evaluation   Cervical / Trunk Assessment Cervical / Trunk Assessment: Normal   Communication Communication Communication: No difficulties   Cognition Arousal/Alertness: Awake/alert Behavior During Therapy: WFL for tasks assessed/performed Overall Cognitive Status: Within Functional Limits for tasks assessed                     General Comments       Exercises       Shoulder Instructions      Home Living Family/patient expects to be discharged to:: Private residence Living Arrangements: Spouse/significant other Available Help at Discharge: Family Type of Home: House Home Access: Stairs to enter Secretary/administrator of Steps: 1 Entrance Stairs-Rails: None Home Layout:  One level     Bathroom Shower/Tub: Tub/shower unit;Curtain Shower/tub characteristics: Buyer, retail: Yes How Accessible: Accessible via walker Home Equipment: None          Prior Functioning/Environment Level of Independence: Independent             OT Diagnosis: Generalized weakness;Acute pain   OT Problem List: Decreased strength;Decreased range of motion;Decreased activity tolerance;Impaired balance (sitting and/or standing);Decreased safety awareness;Decreased  knowledge of use of DME or AE;Pain   OT Treatment/Interventions: Self-care/ADL training;DME and/or AE instruction;Therapeutic activities;Patient/family education    OT Goals(Current goals can be found in the care plan section) Acute Rehab OT Goals Patient Stated Goal: Regain IND and resume previous lifestyle with decreased pain OT Goal Formulation: With patient Time For Goal Achievement: 12/29/15 Potential to Achieve Goals: Good  OT Frequency: Min 3X/week   Barriers to D/C:            Co-evaluation              End of Session Equipment Utilized During Treatment: Gait belt;Rolling walker Nurse Communication: Mobility status  Activity Tolerance: Patient tolerated treatment well Patient left: in bed;with call bell/phone within reach;with bed alarm set   Time: 1343-1403 OT Time Calculation (min): 20 min Charges:  OT General Charges $OT Visit: 1 Procedure OT Evaluation $OT Eval Moderate Complexity: 1 Procedure G-Codes:    Lorena Clearman,HILLARY 2015/12/31, 2:52 PM

## 2015-12-15 NOTE — Evaluation (Signed)
Physical Therapy Evaluation Patient Details Name: Tony Reynolds MRN: [id number] DOB: Sep 25, 1970 Today's Date: 12/15/2015   History of Present Illness  Bil THR 2* idiopathic AVN  Clinical Impression  Pt s/p bil THR presents with decreased bil LE strength/ROM and post op pain limiting functional mobility.  Pt should progress to dc home with family assist and HHPT follow up.    Follow Up Recommendations Home health PT    Equipment Recommendations  Rolling walker with 5" wheels    Recommendations for Other Services OT consult     Precautions / Restrictions Precautions Precautions: Fall Restrictions Weight Bearing Restrictions: No Other Position/Activity Restrictions: WBAT      Mobility  Bed Mobility Overal bed mobility: Needs Assistance Bed Mobility: Supine to Sit     Supine to sit: Min assist     General bed mobility comments: cues for sequence and min assist to manage bil LEs  Transfers Overall transfer level: Needs assistance Equipment used: Rolling walker (2 wheeled) Transfers: Sit to/from Stand Sit to Stand: Min assist;From elevated surface         General transfer comment: cues for LE management and use of UEs to self assist  Ambulation/Gait Ambulation/Gait assistance: Min assist Ambulation Distance (Feet): 58 Feet Assistive device: Rolling walker (2 wheeled) Gait Pattern/deviations: Decreased step length - right;Decreased step length - left;Shuffle;Step-to pattern;Step-through pattern;Trunk flexed Gait velocity: decr Gait velocity interpretation: Below normal speed for age/gender General Gait Details: cues for posture, position from RW and ER bil LEs  Stairs            Wheelchair Mobility    Modified Rankin (Stroke Patients Only)       Balance                                             Pertinent Vitals/Pain Pain Assessment: 0-10 Pain Score: 4  Pain Location: Bil hips Pain Descriptors / Indicators:  Aching;Sore;Tightness Pain Intervention(s): Limited activity within patient's tolerance;Monitored during session;Premedicated before session;Ice applied    Home Living Family/patient expects to be discharged to:: Private residence Living Arrangements: Spouse/significant other Available Help at Discharge: Family Type of Home: House Home Access: Stairs to enter   Secretary/administrator of Steps: 1   Home Equipment: None      Prior Function Level of Independence: Independent               Hand Dominance   Dominant Hand: Right    Extremity/Trunk Assessment   Upper Extremity Assessment: Overall WFL for tasks assessed           Lower Extremity Assessment: RLE deficits/detail;LLE deficits/detail RLE Deficits / Details: Strength at hip 2+/5 with AAROM at hip to 80 flex and 20 abd LLE Deficits / Details: Strength at hip 2+/5 with AAROM at hip to 75 flex and 15 abd  Cervical / Trunk Assessment: Normal  Communication   Communication: No difficulties  Cognition Arousal/Alertness: Awake/alert Behavior During Therapy: WFL for tasks assessed/performed Overall Cognitive Status: Within Functional Limits for tasks assessed                      General Comments      Exercises Total Joint Exercises Ankle Circles/Pumps: AROM;Both;15 reps;Supine Quad Sets: AROM;Both;10 reps;Supine Heel Slides: AAROM;Both;20 reps;Supine Hip ABduction/ADduction: AAROM;Both;15 reps;Supine      Assessment/Plan    PT Assessment Patient needs continued  PT services  PT Diagnosis Difficulty walking   PT Problem List Decreased strength;Decreased range of motion;Decreased activity tolerance;Decreased mobility;Decreased knowledge of use of DME;Pain  PT Treatment Interventions DME instruction;Gait training;Stair training;Functional mobility training;Therapeutic activities;Therapeutic exercise;Patient/family education   PT Goals (Current goals can be found in the Care Plan section) Acute  Rehab PT Goals Patient Stated Goal: Regain IND and resume previous lifestyle with decreased pain PT Goal Formulation: With patient Time For Goal Achievement: 12/19/15 Potential to Achieve Goals: Good    Frequency 7X/week   Barriers to discharge        Co-evaluation               End of Session Equipment Utilized During Treatment: Gait belt Activity Tolerance: Patient tolerated treatment well Patient left: in chair;with call bell/phone within reach;with chair alarm set Nurse Communication: Mobility status         Time: 8882-8003 PT Time Calculation (min) (ACUTE ONLY): 28 min   Charges:   PT Evaluation $PT Eval Low Complexity: 1 Procedure PT Treatments $Therapeutic Exercise: 8-22 mins   PT G Codes:        Anne-Marie Genson 2015/12/16, 12:39 PM

## 2015-12-15 NOTE — Progress Notes (Signed)
Physical Therapy Treatment Patient Details Name: Tony Reynolds MRN: [id number] DOB: [birth year] Today's Date: 12/15/2015    History of Present Illness Bil THR 2* idiopathic AVN    PT Comments    Pt cooperative and progressing well with mobility.  Follow Up Recommendations  Home health PT     Equipment Recommendations  Rolling walker with 5" wheels    Recommendations for Other Services OT consult     Precautions / Restrictions Precautions Precautions: Fall Restrictions Weight Bearing Restrictions: No Other Position/Activity Restrictions: WBAT    Mobility  Bed Mobility Overal bed mobility: Needs Assistance Bed Mobility: Supine to Sit     Supine to sit: Min assist Sit to supine: Min assist (to lift LLE onto bed)   General bed mobility comments: cues for sequence and assist to manage bil LEs  Transfers Overall transfer level: Needs assistance Equipment used: Rolling walker (2 wheeled) Transfers: Sit to/from Stand Sit to Stand: Min assist;From elevated surface         General transfer comment: cues for LE management and use of UEs to self assist  Ambulation/Gait Ambulation/Gait assistance: Min assist Ambulation Distance (Feet): 180 Feet Assistive device: Rolling walker (2 wheeled) Gait Pattern/deviations: Step-through pattern;Decreased step length - right;Decreased step length - left;Shuffle;Trunk flexed Gait velocity: decr Gait velocity interpretation: Below normal speed for age/gender General Gait Details: cues for posture, position from RW and ER bil LEs   Stairs            Wheelchair Mobility    Modified Rankin (Stroke Patients Only)       Balance                                    Cognition Arousal/Alertness: Awake/alert Behavior During Therapy: WFL for tasks assessed/performed Overall Cognitive Status: Within Functional Limits for tasks assessed                      Exercises      General Comments         Pertinent Vitals/Pain Pain Assessment: 0-10 Pain Score: 9  Pain Location: Bil hips Pain Descriptors / Indicators: Aching;Sore Pain Intervention(s): Limited activity within patient's tolerance;Monitored during session;Premedicated before session;Ice applied;Patient requesting pain meds-RN notified    Home Living Family/patient expects to be discharged to:: Private residence Living Arrangements: Spouse/significant other Available Help at Discharge: Family Type of Home: House Home Access: Stairs to enter Entrance Stairs-Rails: None Home Layout: One level Home Equipment: None      Prior Function Level of Independence: Independent          PT Goals (current goals can now be found in the care plan section) Acute Rehab PT Goals Patient Stated Goal: Regain IND and resume previous lifestyle with decreased pain PT Goal Formulation: With patient Time For Goal Achievement: 12/19/15 Potential to Achieve Goals: Good Progress towards PT goals: Progressing toward goals    Frequency  7X/week    PT Plan Current plan remains appropriate    Co-evaluation             End of Session Equipment Utilized During Treatment: Gait belt Activity Tolerance: Patient tolerated treatment well Patient left: in bed;with call bell/phone within reach;with family/visitor present     Time: 8466-5993 PT Time Calculation (min) (ACUTE ONLY): 24 min  Charges:  $Gait Training: 23-37 mins  G Codes:      Elvy Mclarty Dec 25, 2015, 4:51 PM

## 2015-12-15 NOTE — Progress Notes (Signed)
Pt apologized for raising his voice earlier. AC to attempt IV restart. Bradin Mcadory, [employer]

## 2015-12-15 NOTE — Progress Notes (Signed)
Subjective: 1 Day Post-Op Procedure(s) (LRB): BILATERAL ANTERIOR TOTAL HIP ARTHROPLASTY (Bilateral) Patient reports pain as moderate.  Looks great overall for 2 hips.  Acute blood loss anemia from surgery, but asymptomatic.  Objective: Vital signs in last 24 hours: Temp:  [97.7 F (36.5 C)-98.8 F (37.1 C)] 97.8 F (36.6 C) (07/29 0500) Pulse Rate:  [59-91] 65 (07/29 0648) Resp:  [13-22] 18 (07/29 0648) BP: (129-176)/(71-97) 141/77 (07/29 0648) SpO2:  [100 %] 100 % (07/29 0500) Weight:  [77.6 kg (171 lb)] 77.6 kg (171 lb) (07/28 1016)  Intake/Output from previous day: 07/28 0701 - 07/29 0700 In: 5367.5 [P.O.:1160; I.V.:3762.5; IV Piggyback:365] Out: 2600 [Urine:2150; Blood:450] Intake/Output this shift: Total I/O In: 240 [P.O.:240] Out: -    Recent Labs  12/15/15 0407  HGB 9.9*    Recent Labs  12/15/15 0407  WBC 6.2  RBC 3.19*  HCT 27.4*  PLT 166    Recent Labs  12/15/15 0407  NA 132*  K 3.9  CL 101  CO2 24  BUN 10  CREATININE 0.80  GLUCOSE 107*  CALCIUM 8.3*   No results for input(s): LABPT, INR in the last 72 hours.  Sensation intact distally Intact pulses distally Dorsiflexion/Plantar flexion intact Incision: scant drainage  Assessment/Plan: 1 Day Post-Op Procedure(s) (LRB): BILATERAL ANTERIOR TOTAL HIP ARTHROPLASTY (Bilateral) Up with therapy Discharge home with home health next 1-3 days.  Haja Crego Y 12/15/2015, 9:42 AM

## 2015-12-16 LAB — CBC
HCT: 26 % — ABNORMAL LOW (ref 39.0–52.0)
HEMOGLOBIN: 9.4 g/dL — AB (ref 13.0–17.0)
MCH: 31.2 pg (ref 26.0–34.0)
MCHC: 36.2 g/dL — AB (ref 30.0–36.0)
MCV: 86.4 fL (ref 78.0–100.0)
Platelets: 157 10*3/uL (ref 150–400)
RBC: 3.01 MIL/uL — ABNORMAL LOW (ref 4.22–5.81)
RDW: 12.5 % (ref 11.5–15.5)
WBC: 7.5 10*3/uL (ref 4.0–10.5)

## 2015-12-16 NOTE — Progress Notes (Signed)
Subjective: 2 Days Post-Op Procedure(s) (LRB): BILATERAL ANTERIOR TOTAL HIP ARTHROPLASTY (Bilateral) Patient reports pain as moderate.  Good progress overall, but feels more pain from all of his activity yesterday.  Acute blood loss anemia from surgery, but tolerating very well.  Objective: Vital signs in last 24 hours: Temp:  [98.6 F (37 C)-100.4 F (38 C)] 98.6 F (37 C) (07/30 0458) Pulse Rate:  [75-99] 84 (07/30 0458) Resp:  [16-24] 22 (07/30 0458) BP: (127-144)/(66-87) 137/87 (07/30 0458) SpO2:  [100 %] 100 % (07/30 0458)  Intake/Output from previous day: 07/29 0701 - 07/30 0700 In: 1800 [P.O.:1800] Out: 1400 [Urine:1400] Intake/Output this shift: Total I/O In: 120 [P.O.:120] Out: -    Recent Labs  12/15/15 0407 12/16/15 0438  HGB 9.9* 9.4*    Recent Labs  12/15/15 0407 12/16/15 0438  WBC 6.2 7.5  RBC 3.19* 3.01*  HCT 27.4* 26.0*  PLT 166 157    Recent Labs  12/15/15 0407  NA 132*  K 3.9  CL 101  CO2 24  BUN 10  CREATININE 0.80  GLUCOSE 107*  CALCIUM 8.3*   No results for input(s): LABPT, INR in the last 72 hours.  Sensation intact distally Intact pulses distally Dorsiflexion/Plantar flexion intact Incision: scant drainage Compartment soft  Assessment/Plan: 2 Days Post-Op Procedure(s) (LRB): BILATERAL ANTERIOR TOTAL HIP ARTHROPLASTY (Bilateral) Up with therapy Discharge home with home health Tuesday  Klye Besecker Y 12/16/2015, 8:42 AM

## 2015-12-16 NOTE — Clinical Social Work Note (Signed)
CSW received consult for possible SNF. Reviewed chart. PT recommending home with home health and pt has selected home health agency. MD note also indicates d/c home on Tuesday. CSW will sign off, but can be reconsulted if needed.  Derenda Fennel, LCSW [phone]

## 2015-12-16 NOTE — Progress Notes (Signed)
Patient found ambulating in room un assisted to bathroom. Patient reminded several times this shift to ask for assistance when walking in room. Bed alarm on at this time.

## 2015-12-16 NOTE — Progress Notes (Signed)
Physical Therapy Treatment Patient Details Name: Tony Reynolds MRN: [id number] DOB: 04/14/1971 Today's Date: 12/16/2015    History of Present Illness Bil THR 2* idiopathic AVN    PT Comments    The patient is progressing. C/o tightness of the L>R thigh. Continue PT while in acute care.  Follow Up Recommendations  Home health PT     Equipment Recommendations  Rolling walker with 5" wheels;3in1 (PT)    Recommendations for Other Services       Precautions / Restrictions Precautions Precautions: Fall    Mobility  Bed Mobility   Bed Mobility: Supine to Sit;Sit to Supine     Supine to sit: Supervision Sit to supine: Supervision   General bed mobility comments: uses the  sheet around botth feet to self assist the legs off of the bed and back onto the bed. He did use the rail to get up  Transfers   Equipment used: Rolling walker (2 wheeled) Transfers: Sit to/from Stand Sit to Stand: Supervision            Ambulation/Gait Ambulation/Gait assistance: Supervision Ambulation Distance (Feet): 215 Feet Assistive device: Rolling walker (2 wheeled) Gait Pattern/deviations: Step-to pattern;Step-through pattern;Decreased step length - left;Decreased step length - right         Stairs            Wheelchair Mobility    Modified Rankin (Stroke Patients Only)       Balance                                    Cognition Arousal/Alertness: Awake/alert                          Exercises      General Comments        Pertinent Vitals/Pain Pain Score: 7  Pain Location: L>R thigh Pain Descriptors / Indicators: Tightness;Discomfort;Guarding Pain Intervention(s): Premedicated before session;Repositioned;Ice applied    Home Living                      Prior Function            PT Goals (current goals can now be found in the care plan section) Progress towards PT goals: Progressing toward goals    Frequency  7X/week    PT Plan Current plan remains appropriate    Co-evaluation             End of Session   Activity Tolerance: Patient tolerated treatment well Patient left: in bed;with call bell/phone within reach     Time: 1411-1425 PT Time Calculation (min) (ACUTE ONLY): 14 min  Charges:  $Gait Training: 8-22 mins                    G Codes:      Rada Hay 12/16/2015, 4:51 PM Blanchard Kelch PT [phone]

## 2015-12-16 NOTE — Care Management Note (Signed)
Case Management Note  Patient Details  Name: Tony Reynolds MRN: [id number] Date of Birth: Sep 27, 1970  Subjective/Objective:   Bilateral THR                 Action/Plan: Discharge Planning: NCM spoke to pt and offered choice for [facility]. Pt agreeable to [facility] for [facility]. Pt requesting RW and 3n1 for home. Contacted [facility] for DME for home.    Expected Discharge Date:  12/17/2015               Expected Discharge Plan:  Home w Home Health Services  In-House Referral:  NA  Discharge planning Services  CM Consult  Post Acute Care Choice:  Home Health Choice offered to:  Patient  DME Arranged:  3-N-1, Walker rolling DME Agency:  [facility]  HH Arranged:  PT HH Agency:  [facility] (now [facility]  Status of Service:  Completed, signed off  If discussed at [employer] of Stay Meetings, dates discussed:    Additional Comments:  Elliot Cousin, RN 12/16/2015, 9:12 AM

## 2015-12-16 NOTE — Progress Notes (Signed)
Occupational Therapy Treatment Patient Details Name: Tony Reynolds MRN: [id number] DOB: 1970/08/03 Today's Date: 12/16/2015    History of present illness Bil THR 2* idiopathic AVN   OT comments  Pt c/o dizziness that limited session. Pt remains bed level at this time and RN aware. BP 132/71 pt states "i can't keep my eyes open. Pt with medication provided 8:05 Am per chart. Questions medication related.    Follow Up Recommendations  No OT follow up;Supervision/Assistance - 24 hour    Equipment Recommendations  3 in 1 bedside comode;Tub/shower bench    Recommendations for Other Services      Precautions / Restrictions Precautions Precautions: Fall Restrictions Other Position/Activity Restrictions: WBAT       Mobility Bed Mobility Overal bed mobility: Needs Assistance Bed Mobility: Supine to Sit       Sit to supine: Min assist   General bed mobility comments: wife (A)ing   Transfers Overall transfer level: Needs assistance     Sit to Stand: Supervision         General transfer comment: requires bil LE    Balance                                   ADL Overall ADL's : Needs assistance/impaired                         Toilet Transfer: Min Administrator Details (indicate cue type and reason): observed patient transfer prior to session     Tub/ Shower Transfer: Tub transfer;Tub Insurance underwriter Details (indicate cue type and reason): Pt educated on tub bench transfer vs 3n1. pt now dizzy and bed level. Pt expressed desire to have tub bench.  Functional mobility during ADLs: Min guard;Rolling walker General ADL Comments: Pt demonstrates bed mobility and bathroom transfer min guard (A). OT attempting tub bench demo and patient becoming dizzy. pt with BP 132/71 RN notified.       Vision                     Perception     Praxis      Cognition   Behavior During Therapy: [facility] for tasks  assessed/performed Overall Cognitive Status: Within Functional Limits for tasks assessed                       Extremity/Trunk Assessment               Exercises Total Joint Exercises Ankle Circles/Pumps: AROM;Both;20 reps Quad Sets: Both;20 reps Short Arc Quad: AROM Heel Slides: AAROM;Both;20 reps Hip ABduction/ADduction: AAROM;Both;20 reps Long Arc Quad: AROM;Both;20 reps   Shoulder Instructions       General Comments      Pertinent Vitals/ Pain       Pain Assessment: 0-10 Pain Score: 5  Pain Location: hips Pain Descriptors / Indicators: Constant;Operative site guarding Pain Intervention(s): Monitored during session;Premedicated before session;Repositioned (reports dizziness)  Home Living                                          Prior Functioning/Environment              Frequency Min 3X/week     Progress Toward Goals  OT Goals(current goals can  now be found in the care plan section)  Progress towards OT goals: Progressing toward goals  Acute Rehab OT Goals Patient Stated Goal: Regain IND and resume previous lifestyle with decreased pain OT Goal Formulation: With patient Time For Goal Achievement: 12/29/15 Potential to Achieve Goals: Good ADL Goals Pt Will Perform Lower Body Bathing: with supervision;with set-up;with caregiver independent in assisting;with adaptive equipment;sit to/from stand Pt Will Perform Lower Body Dressing: with set-up;with supervision;with caregiver independent in assisting;with adaptive equipment;sit to/from stand Pt Will Transfer to Toilet: with modified independence;ambulating;bedside commode Pt Will Perform Tub/Shower Transfer: with min guard assist;rolling walker;3 in 1;with caregiver independent in assisting  Plan Discharge plan remains appropriate    Co-evaluation                 End of Session Equipment Utilized During Treatment: Rolling walker   Activity Tolerance Other (comment)  (dizziness limited session)   Patient Left in bed;with call bell/phone within reach;with family/visitor present   Nurse Communication Mobility status;Precautions        Time: 7893-8101 OT Time Calculation (min): 25 min  Charges: OT General Charges $OT Visit: 1 Procedure OT Treatments $Self Care/Home Management : 23-37 mins  Tony Reynolds 12/16/2015, 10:28 AM   Tony Reynolds   OTR/L Pager: [phone] Office: [phone] .

## 2015-12-16 NOTE — Progress Notes (Signed)
Physical Therapy Treatment Patient Details Name: Tony Reynolds MRN: [id number] DOB: [birth year] Today's Date: 12/16/2015    History of Present Illness Bil THR 2* idiopathic AVN    PT Comments     The patient was ambulating in the room upon PT's arrival. Assisted into bed. Performed exercises . Will see in PM for ambulation.  Follow Up Recommendations  Home health PT     Equipment Recommendations  Rolling walker with 5" wheels;3in1 (PT)    Recommendations for Other Services       Precautions / Restrictions Precautions Precautions: Fall Restrictions Other Position/Activity Restrictions: WBAT    Mobility  Bed Mobility Overal bed mobility: Needs Assistance Bed Mobility: Sit to Supine       Sit to supine: Min assist   General bed mobility comments: attempted use of Leg lifter, placed both feet  in loop,  still required assist to get legs onto the bed.  Transfers       Sit to Stand: Supervision         General transfer comment: stand to sit  onto high levbel bed.  Ambulation/Gait Ambulation/Gait assistance: Supervision   Assistive device: Rolling walker (2 wheeled) Gait Pattern/deviations: Step-to pattern     General Gait Details: patient coming out of the bathroom, wife in room.    Stairs            Wheelchair Mobility    Modified Rankin (Stroke Patients Only)       Balance                                    Cognition Arousal/Alertness: Awake/alert                          Exercises Total Joint Exercises Ankle Circles/Pumps: AROM;Both;20 reps Quad Sets: Both;20 reps Short Arc Quad: AROM Heel Slides: AAROM;Both;20 reps Hip ABduction/ADduction: AAROM;Both;20 reps Long Arc Quad: AROM;Both;20 reps    General Comments        Pertinent Vitals/Pain      Home Living                      Prior Function            PT Goals (current goals can now be found in the care plan section) Progress  towards PT goals: Progressing toward goals    Frequency  7X/week    PT Plan Current plan remains appropriate    Co-evaluation             End of Session   Activity Tolerance: Patient limited by pain Patient left: in bed;with call bell/phone within reach;with bed alarm set;with family/visitor present     Time: 0900-0930 PT Time Calculation (min) (ACUTE ONLY): 30 min  Charges:  $Therapeutic Exercise: 23-37 mins                    G Codes:      Rada Hay 12/16/2015, 10:07 AM Blanchard Kelch PT [phone]

## 2015-12-17 NOTE — Progress Notes (Signed)
Physical Therapy Treatment Patient Details Name: Tony Reynolds MRN: [id number] DOB: 1971-01-11 Today's Date: 12/17/2015    History of Present Illness Bil THR 2* idiopathic AVN    PT Comments    The patient is progressing, most complaint is of L thigh swelling and tightness. plans Dc in AM  Follow Up Recommendations  Home health PT     Equipment Recommendations  Rolling walker with 5" wheels    Recommendations for Other Services       Precautions / Restrictions Precautions Precautions: None    Mobility  Bed Mobility Overal bed mobility: Modified Independent             General bed mobility comments: uses the  sheet around both feet to self assist the legs off of the bed and back onto the bed. He did use the rail to get up  Transfers Overall transfer level: Modified independent Equipment used: Rolling walker (2 wheeled) Transfers: Sit to/from Stand Sit to Stand: Modified independent (Device/Increase time)         General transfer comment: extra time to power up.  Ambulation/Gait Ambulation/Gait assistance: Supervision Ambulation Distance (Feet): 280 Feet Assistive device: Rolling walker (2 wheeled) Gait Pattern/deviations: Step-to pattern;Step-through pattern     General Gait Details: into bathroom   Stairs Stairs: Yes Stairs assistance: Min guard Stair Management: No rails;Forwards;With walker Number of Stairs: 1 General stair comments: cues for stronger leg first=right  Wheelchair Mobility    Modified Rankin (Stroke Patients Only)       Balance                                    Cognition Arousal/Alertness: Awake/alert                          Exercises Total Joint Exercises Ankle Circles/Pumps: AROM;Both;20 reps Quad Sets: Both;20 reps Short Arc Quad: 10 reps Heel Slides: AAROM;Both;20 reps Hip ABduction/ADduction: AAROM;Both;20 reps    General Comments        Pertinent Vitals/Pain Pain Score: 5   Pain Location: bil thighs Pain Descriptors / Indicators: Sore;Tightness Pain Intervention(s): Monitored during session;Premedicated before session    Home Living                      Prior Function            PT Goals (current goals can now be found in the care plan section) Progress towards PT goals: Progressing toward goals    Frequency  7X/week    PT Plan Current plan remains appropriate    Co-evaluation             End of Session   Activity Tolerance: Patient tolerated treatment well Patient left:  (in bathroom)     Time: 0321-2248 PT Time Calculation (min) (ACUTE ONLY): 13 min  Charges:  $Gait Training: 8-22 mins $Therapeutic Exercise: 8-22 mins                    G Codes:      Rada Hay 12/17/2015, 3:33 PM

## 2015-12-17 NOTE — Care Management Note (Signed)
Case Management Note  Patient Details  Name: CURTEZ HUBLEY MRN: [id number] Date of Birth: 14-Jan-1971  Subjective/Objective:  [facility] dme rep-Jermaine-informed of d/c in early am-to please take dme-rw,3n1 to rm today. Genevieve Norlander rep Tim already aware of d/c & HHPT orders.                  Action/Plan:d/c home w/HHC/DME   Expected Discharge Date:                  Expected Discharge Plan:  Home w Home Health Services  In-House Referral:  NA  Discharge planning Services  CM Consult  Post Acute Care Choice:  Home Health Choice offered to:  Patient  DME Arranged:  3-N-1, Walker rolling DME Agency:  [facility]  HH Arranged:  PT HH Agency:  [facility] ([facility]  Status of Service:  Completed, signed off  If discussed at [employer] of Stay Meetings, dates discussed:    Additional Comments:  Lanier Clam, RN 12/17/2015, 3:44 PM

## 2015-12-17 NOTE — Progress Notes (Signed)
Occupational Therapy Treatment Patient Details Name: Tony Reynolds MRN: [id number] DOB: Apr 14, 1971 Today's Date: 12/17/2015    History of present illness Bil THR 2* idiopathic AVN   OT comments  Pt. Was ed on use of AE for LE ADLs to increase I and decrease pain with task. Pt. Was ed on tub transfer with bench and was able to return demo. Pt. Is motivated to increase to I level. Pt. Was ed on purchasing hip kit for further I.   Follow Up Recommendations  No OT follow up    Equipment Recommendations       Recommendations for Other Services      Precautions / Restrictions Precautions Precautions: Fall Restrictions Weight Bearing Restrictions: No       Mobility Bed Mobility                  Transfers   Equipment used: Rolling walker (2 wheeled) Transfers: Sit to/from [employer] Sit to Stand: Supervision Stand pivot transfers: Supervision            Balance                                   ADL               Lower Body Bathing: Supervison/ safety;Set up;With adaptive equipment;Sit to/from stand       Lower Body Dressing: Supervision/safety;Set up;With adaptive equipment;Sit to/from stand           Tub/ Shower Transfer: Tub transfer;Minimal assistance;Cueing for safety;Tub bench   Functional mobility during ADLs: Supervision/safety;Rolling walker General ADL Comments: Pt. did well with LE ADLs with use of AE. Pt. was ed on purchasing hip kit to increase I and decrease pain with dressing.       Vision                     Perception     Praxis      Cognition   Behavior During Therapy: WFL for tasks assessed/performed Overall Cognitive Status: Within Functional Limits for tasks assessed                       Extremity/Trunk Assessment               Exercises     Shoulder Instructions       General Comments      Pertinent Vitals/ Pain       Pain Score: 5  Pain Location:  B thighs Pain Intervention(s): Premedicated before session  Home Living                                          Prior Functioning/Environment              Frequency Min 3X/week     Progress Toward Goals  OT Goals(current goals can now be found in the care plan section)  Progress towards OT goals: Progressing toward goals     Plan Discharge plan remains appropriate    Co-evaluation                 End of Session Equipment Utilized During Treatment: Rolling walker   Activity Tolerance     Patient Left in bed;with call bell/phone within reach   Nurse Communication  Time: 1000-1045 OT Time Calculation (min): 45 min  Charges: OT General Charges $OT Visit: 1 Procedure OT Treatments $Self Care/Home Management : 38-52 mins  Kaelynne Christley 12/17/2015, 10:53 AM

## 2015-12-17 NOTE — Progress Notes (Signed)
Subjective: 3 Days Post-Op Procedure(s) (LRB): BILATERAL ANTERIOR TOTAL HIP ARTHROPLASTY (Bilateral) Patient reports pain as moderate.  Making better progress.  Objective: Vital signs in last 24 hours: Temp:  [98.5 F (36.9 C)-99 F (37.2 C)] 98.9 F (37.2 C) (07/31 2725) Pulse Rate:  [82-93] 91 (07/31 0602) Resp:  [16-19] 16 (07/31 0602) BP: (129-137)/(60-74) 129/64 (07/31 0602) SpO2:  [98 %-100 %] 99 % (07/31 0602)  Intake/Output from previous day: 07/30 0701 - 07/31 0700 In: 720 [P.O.:720] Out: 900 [Urine:900] Intake/Output this shift: No intake/output data recorded.   Recent Labs  12/15/15 0407 12/16/15 0438  HGB 9.9* 9.4*    Recent Labs  12/15/15 0407 12/16/15 0438  WBC 6.2 7.5  RBC 3.19* 3.01*  HCT 27.4* 26.0*  PLT 166 157    Recent Labs  12/15/15 0407  NA 132*  K 3.9  CL 101  CO2 24  BUN 10  CREATININE 0.80  GLUCOSE 107*  CALCIUM 8.3*   No results for input(s): LABPT, INR in the last 72 hours.  Sensation intact distally Intact pulses distally Dorsiflexion/Plantar flexion intact Incision: scant drainage Compartment soft  Assessment/Plan: 3 Days Post-Op Procedure(s) (LRB): BILATERAL ANTERIOR TOTAL HIP ARTHROPLASTY (Bilateral) Up with therapy Plan for discharge tomorrow Discharge home with home health  Kathryne Hitch 12/17/2015, 7:13 AM

## 2015-12-17 NOTE — Progress Notes (Signed)
Physical Therapy Treatment Patient Details Name: ROANIN KONOPA MRN: [id number] DOB: May 30, 1970 Today's Date: 12/17/2015    History of Present Illness Bil THR 2* idiopathic AVN    PT Comments    ROM progressing as well as ambulation and bed mobility. Using sheet ario=ound both feet to move legs on and off the bed.  Follow Up Recommendations  Home health PT     Equipment Recommendations  Rolling walker with 5" wheels;3in1 (PT)    Recommendations for Other Services       Precautions / Restrictions Precautions Precautions: None Restrictions Weight Bearing Restrictions: No    Mobility  Bed Mobility Overal bed mobility: Modified Independent             General bed mobility comments: uses the  sheet around both feet to self assist the legs off of the bed and back onto the bed. He did use the rail to get up  Transfers Overall transfer level: Needs assistance Equipment used: Rolling walker (2 wheeled) Transfers: Sit to/from Stand Sit to Stand: Modified independent (Device/Increase time) Stand pivot transfers: Supervision       General transfer comment: extra time to power up.  Ambulation/Gait Ambulation/Gait assistance: Supervision Ambulation Distance (Feet): 25 Feet Assistive device: Rolling walker (2 wheeled) Gait Pattern/deviations: Step-to pattern     General Gait Details: into bathroom   Stairs            Wheelchair Mobility    Modified Rankin (Stroke Patients Only)       Balance                                    Cognition Arousal/Alertness: Awake/alert Behavior During Therapy: WFL for tasks assessed/performed Overall Cognitive Status: Within Functional Limits for tasks assessed                      Exercises Total Joint Exercises Ankle Circles/Pumps: AROM;Both;20 reps Quad Sets: Both;20 reps Short Arc Quad: 10 reps Heel Slides: AAROM;Both;20 reps Hip ABduction/ADduction: AAROM;Both;20 reps    General  Comments        Pertinent Vitals/Pain Pain Score: 5  Pain Location: bil thighs Pain Descriptors / Indicators: Sore;Tightness Pain Intervention(s): Monitored during session;Patient requesting pain meds-RN notified;Ice applied    Home Living                      Prior Function            PT Goals (current goals can now be found in the care plan section) Progress towards PT goals: Progressing toward goals    Frequency  7X/week    PT Plan Current plan remains appropriate    Co-evaluation             End of Session   Activity Tolerance: Patient tolerated treatment well Patient left: in bed;with call bell/phone within reach     Time: 1140-1205 PT Time Calculation (min) (ACUTE ONLY): 25 min  Charges:  $Gait Training: 8-22 mins $Therapeutic Exercise: 8-22 mins                    G Codes:      Rada Hay 12/17/2015, 1:13 PM

## 2015-12-18 MED ORDER — ASPIRIN 325 MG PO TBEC
325.0000 mg | DELAYED_RELEASE_TABLET | Freq: Two times a day (BID) | ORAL | 0 refills | Status: DC
Start: 2015-12-18 — End: 2017-05-28

## 2015-12-18 MED ORDER — OXYCODONE-ACETAMINOPHEN 5-325 MG PO TABS: 1.0000 | ORAL_TABLET | ORAL | 0 refills | Status: DC | PRN

## 2015-12-18 MED ORDER — METHOCARBAMOL 500 MG PO TABS: 500.0000 mg | ORAL_TABLET | Freq: Four times a day (QID) | ORAL | 0 refills | Status: DC | PRN

## 2015-12-18 NOTE — Discharge Instructions (Signed)

## 2015-12-18 NOTE — Progress Notes (Signed)
Patient ID: Tony Reynolds, male   DOB: [birth year], 45 y.o.   MRN: [id number] Doing well.  Can be discharged to home today.

## 2015-12-18 NOTE — Discharge Summary (Signed)
Patient ID: Tony Reynolds MRN: [id number] DOB/AGE: [birth year] 45 y.o.  Admit date: 12/14/2015 Discharge date: 12/18/2015  Admission Diagnoses:  Principal Problem:   Avascular necrosis of bones of both hips [facility]) Active Problems:   Status post bilateral hip replacements   Discharge Diagnoses:  Same  Past Medical History:  Diagnosis Date  . Arthritis   . Back pain   . Eczema   . Eczema   . Hypertension   . Lumbar disc herniation     Surgeries: Procedure(s): BILATERAL ANTERIOR TOTAL HIP ARTHROPLASTY on 12/14/2015   Consultants:   Discharged Condition: Improved  Hospital Course: Tony Reynolds is an 45 y.o. male who was admitted 12/14/2015 for operative treatment ofAvascular necrosis of bones of both hips (HCC). Patient has severe unremitting pain that affects sleep, daily activities, and work/hobbies. After pre-op clearance the patient was taken to the operating room on 12/14/2015 and underwent  Procedure(s): BILATERAL ANTERIOR TOTAL HIP ARTHROPLASTY.    Patient was given perioperative antibiotics: Anti-infectives    Start     Dose/Rate Route Frequency Ordered Stop   12/14/15 2000  ceFAZolin (ANCEF) IVPB 1 g/50 mL premix     1 g 100 mL/hr over 30 Minutes Intravenous Every 6 hours 12/14/15 1743 12/15/15 0222   12/14/15 0951  ceFAZolin (ANCEF) IVPB 2g/100 mL premix     2 g 200 mL/hr over 30 Minutes Intravenous On call to O.R. 12/14/15 0951 12/14/15 1310       Patient was given sequential compression devices, early ambulation, and chemoprophylaxis to prevent DVT.  Patient benefited maximally from hospital stay and there were no complications.    Recent vital signs: Patient Vitals for the past 24 hrs:  BP Temp Temp src Pulse Resp SpO2  12/18/15 0655 (!) 141/81 99.4 F (37.4 C) Oral 95 16 100 %  12/17/15 2258 (!) 138/59 99 F (37.2 C) Oral 89 18 99 %  12/17/15 1407 117/74 99.4 F (37.4 C) Oral 69 18 100 %  12/17/15 0859 129/75 - - - - -     Recent laboratory  studies:  Recent Labs  12/16/15 0438  WBC 7.5  HGB 9.4*  HCT 26.0*  PLT 157     Discharge Medications:     Medication List    TAKE these medications   amLODipine 10 MG tablet Commonly known as:  NORVASC Take 1 tablet (10 mg total) by mouth daily.   aspirin 325 MG EC tablet Take 1 tablet (325 mg total) by mouth 2 (two) times daily after a meal.   HYDROcodone-acetaminophen 5-325 MG tablet Commonly known as:  NORCO/VICODIN Take 1 tablet by mouth every 4 (four) hours as needed for moderate pain (Must last 14 days.Do not take and drive a car or use machinery.).   lisinopril 40 MG tablet Commonly known as:  PRINIVIL,ZESTRIL Take 1 tablet (40 mg total) by mouth daily.   methocarbamol 500 MG tablet Commonly known as:  ROBAXIN Take 1 tablet (500 mg total) by mouth every 6 (six) hours as needed for muscle spasms.   oxyCODONE-acetaminophen 5-325 MG tablet Commonly known as:  ROXICET Take 1-2 tablets by mouth every 4 (four) hours as needed.       Diagnostic Studies: Dg Pelvis Portable  Result Date: 12/14/2015 CLINICAL DATA:  Bilateral hip replacements EXAM: PORTABLE PELVIS 1-2 VIEWS COMPARISON:  11/19/2015 FINDINGS: Changes of bilateral hip replacements. Normal AP alignment. No hardware or bony complicating feature. IMPRESSION: Bilateral hip replacements.  No visible complicating feature. Electronically Signed  By: Charlett Nose M.D.   On: 12/14/2015 16:25  Dg C-arm Gt 120 Min-no Report  Result Date: 12/14/2015 CLINICAL DATA: surgery C-ARM GT 120 MINUTE Fluoroscopy was utilized by the requesting physician.  No radiographic interpretation.   Dg Hip Operative Unilat With Pelvis Left  Result Date: 12/14/2015 CLINICAL DATA:  Anterior approach left total hip arthroplasty performed for avascular necrosis of femoral head. EXAM: OPERATIVE LEFT HIP WITH PELVIS 1 VIEW COMPARISON:  AP pelvis and bilateral hip x-rays 11/19/2015. FLUOROSCOPY TIME:  Radiation Exposure Index (as provided  by the fluoroscopic device): 1.74 mGy FINDINGS: Two spot images from the C-arm fluoroscopic device, AP views of the left hip were obtained. Anatomic alignment after left total hip arthroplasty. No complicating features. IMPRESSION: Anatomic alignment in the AP projection of the left total hip arthroplasty. Electronically Signed   By: Hulan Saas M.D.   On: 12/14/2015 17:08  Dg Hip Operative Unilat With Pelvis Right  Result Date: 12/14/2015 CLINICAL DATA:  Anterior approach right total hip arthroplasty performed for avascular necrosis of femoral head. EXAM: OPERATIVE RIGHT HIP WITH PELVIS 1 VIEW COMPARISON:  AP pelvis and bilateral hip x-rays 11/19/2015. FLUOROSCOPY TIME:  Radiation Exposure Index (as provided by the fluoroscopic device): 1.59 mGy FINDINGS: Two spot images from the C-arm fluoroscopic device, AP views of the right hip were obtained. Anatomic alignment after right total hip arthroplasty. No complicating features. IMPRESSION: Anatomic alignment in the AP projection of the right total hip arthroplasty. Electronically Signed   By: Hulan Saas M.D.   On: 12/14/2015 17:10   Disposition: 01-Home or Self Care  Discharge Instructions    Call MD / Call 911    Complete by:  As directed   If you experience chest pain or shortness of breath, CALL 911 and be transported to the hospital emergency room.  If you develope a fever above 101 F, pus (white drainage) or increased drainage or redness at the wound, or calf pain, call your surgeon's office.   Constipation Prevention    Complete by:  As directed   Drink plenty of fluids.  Prune juice may be helpful.  You may use a stool softener, such as Colace (over the counter) 100 mg twice a day.  Use MiraLax (over the counter) for constipation as needed.   Diet - low sodium heart healthy    Complete by:  As directed   Discharge patient    Complete by:  As directed   Increase activity slowly as tolerated    Complete by:  As directed       Follow-up Information    [facility] .   Why:  Home Health Physical Therapy  Contact information: [address] [place] [address] [phone]        Kathryne Hitch, MD Follow up in 2 week(s).   Specialty:  Orthopedic Surgery Contact information: [address] [place] [address] [phone]            Signed: Kathryne Hitch 12/18/2015, 7:00 AM

## 2016-05-19 DIAGNOSIS — I219 Acute myocardial infarction, unspecified: Secondary | ICD-10-CM

## 2016-05-19 HISTORY — DX: Acute myocardial infarction, unspecified: I21.9

## 2016-10-20 ENCOUNTER — Telehealth (INDEPENDENT_AMBULATORY_CARE_PROVIDER_SITE_OTHER): Payer: Self-pay | Admitting: Radiology

## 2016-10-20 ENCOUNTER — Encounter (INDEPENDENT_AMBULATORY_CARE_PROVIDER_SITE_OTHER): Payer: Self-pay

## 2016-10-20 NOTE — Telephone Encounter (Signed)
Faxed note.

## 2016-10-20 NOTE — Telephone Encounter (Signed)
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  He states that he cannot do this, he needs a letter from Dr. Magnus Ivan stating this.  He states that he will call back with a fax number to send letter to. He is suppose to go to work today and needs this for them.

## 2016-10-20 NOTE — Telephone Encounter (Signed)
Patient called to see if note about work restrictions had been faxed, I advised yes.

## 2016-10-20 NOTE — Telephone Encounter (Signed)
Ok to give this note since her is post bilateral hip replacements

## 2016-10-20 NOTE — Telephone Encounter (Signed)
Fax # [phone]

## 2016-10-20 NOTE — Telephone Encounter (Signed)
Please advise 

## 2017-03-01 ENCOUNTER — Emergency Department (HOSPITAL_COMMUNITY): Payer: BLUE CROSS/BLUE SHIELD

## 2017-03-01 ENCOUNTER — Emergency Department (HOSPITAL_COMMUNITY)
Admission: EM | Admit: 2017-03-01 | Discharge: 2017-03-02 | Disposition: A | Discharge status: Home / Self Care | Payer: BLUE CROSS/BLUE SHIELD | Attending: Emergency Medicine | Admitting: Emergency Medicine

## 2017-03-01 ENCOUNTER — Encounter (HOSPITAL_COMMUNITY): Payer: Self-pay | Admitting: Emergency Medicine

## 2017-03-01 DIAGNOSIS — I10 Essential (primary) hypertension: Secondary | ICD-10-CM | POA: Diagnosis not present

## 2017-03-01 DIAGNOSIS — R0789 Other chest pain: Secondary | ICD-10-CM | POA: Diagnosis present

## 2017-03-01 DIAGNOSIS — Z96643 Presence of artificial hip joint, bilateral: Secondary | ICD-10-CM | POA: Insufficient documentation

## 2017-03-01 DIAGNOSIS — Z79899 Other long term (current) drug therapy: Secondary | ICD-10-CM | POA: Diagnosis not present

## 2017-03-01 DIAGNOSIS — Z7982 Long term (current) use of aspirin: Secondary | ICD-10-CM | POA: Insufficient documentation

## 2017-03-01 DIAGNOSIS — F17210 Nicotine dependence, cigarettes, uncomplicated: Secondary | ICD-10-CM | POA: Insufficient documentation

## 2017-03-01 LAB — CBC
HCT: 42.7 % (ref 39.0–52.0)
Hemoglobin: 15 g/dL (ref 13.0–17.0)
MCH: 29.9 pg (ref 26.0–34.0)
MCHC: 35.1 g/dL (ref 30.0–36.0)
MCV: 85.1 fL (ref 78.0–100.0)
PLATELETS: 205 10*3/uL (ref 150–400)
RBC: 5.02 MIL/uL (ref 4.22–5.81)
RDW: 12.9 % (ref 11.5–15.5)
WBC: 6.4 10*3/uL (ref 4.0–10.5)

## 2017-03-01 LAB — BASIC METABOLIC PANEL
Anion gap: 9 (ref 5–15)
BUN: 14 mg/dL (ref 6–20)
CHLORIDE: 104 mmol/L (ref 101–111)
CO2: 28 mmol/L (ref 22–32)
CREATININE: 0.82 mg/dL (ref 0.61–1.24)
Calcium: 9.3 mg/dL (ref 8.9–10.3)
GFR calc non Af Amer: >60 mL/min (ref >60)
Glucose, Bld: 114 mg/dL — ABNORMAL HIGH (ref 65–99)
Potassium: 3.6 mmol/L (ref 3.5–5.1)
Sodium: 141 mmol/L (ref 135–145)

## 2017-03-01 LAB — I-STAT TROPONIN, ED: Troponin i, poc: 0.02 ng/mL (ref 0.00–0.08)

## 2017-03-01 MED ORDER — MORPHINE SULFATE (PF) 4 MG/ML IV SOLN
4.0000 mg | Freq: Once | INTRAVENOUS | Status: AC
  Administered 2017-03-01: 4 mg via INTRAVENOUS
  Filled 2017-03-01: qty 1

## 2017-03-01 MED ORDER — ONDANSETRON HCL 4 MG/2ML IJ SOLN
4.0000 mg | Freq: Once | INTRAMUSCULAR | Status: AC
  Administered 2017-03-01: 4 mg via INTRAVENOUS
  Filled 2017-03-01: qty 2

## 2017-03-01 NOTE — ED Triage Notes (Signed)
Pain in center of chest radiating to center of shoulder blades

## 2017-03-01 NOTE — ED Provider Notes (Signed)
[facility]Y DEPT Provider Note   CSN: [id number] Arrival date & time: 03/01/17  2236     History   Chief Complaint Chief Complaint  Patient presents with  . Chest Pain    HPI Tony Reynolds is a 46 y.o. male.  Patient presents with 5 day history of constant chest pain that radiates to his mid back. 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 The pain is constant and unrelieved with ibuprofen. He denies any nausea, vomiting or diaphoresis. There is some shortness of breath and pain with rotation of his torso and pain with taking a deep breath. Patient denies any focal weakness, numbness or tingling. He denies any cough or fever. Denies any abdominal pain or vomiting. Denies any history of acid reflux or ulcers. Denies any excessive alcohol or NSAID use. History of bilateral hip replacement last year. Was taken off his blood pressure medications and not restarted on them for unclear reasons   The history is provided by the patient.    Past Medical History:  Diagnosis Date  . Arthritis   . Back pain   . Eczema   . Eczema   . Hypertension   . Lumbar disc herniation     Patient Active Problem List   Diagnosis Date Noted  . 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 BILATERAL ANTERIOR TOTAL HIP ARTHROPLASTY Bilateral 12/14/2015   Procedure: BILATERAL ANTERIOR TOTAL HIP ARTHROPLASTY;  Surgeon: Kathryne Hitch, MD;  Location: [facility];  Service: Orthopedics;  Laterality: Bilateral;  . NO PAST SURGERIES         Home Medications    Prior to Admission medications   Medication Sig Start Date End Date Taking? Authorizing Provider  amLODipine (NORVASC) 10 MG tablet Take 1 tablet (10 mg total) by mouth daily. Patient not taking: Reported on 11/15/2015 10/19/15    Antoine Poche, MD  aspirin EC 325 MG EC tablet Take 1 tablet (325 mg total) by mouth 2 (two) times daily after a meal. 12/18/15   Kathryne Hitch, MD  HYDROcodone-acetaminophen (NORCO/VICODIN) 5-325 MG tablet Take 1 tablet by mouth every 4 (four) hours as needed for moderate pain (Must last 14 days.Do not take and drive a car or use machinery.). 11/14/15   Vickki Hearing, MD  lisinopril (PRINIVIL,ZESTRIL) 40 MG tablet Take 1 tablet (40 mg total) by mouth daily. 10/29/15   Antoine Poche, MD  methocarbamol (ROBAXIN) 500 MG tablet Take 1 tablet (500 mg total) by mouth every 6 (six) hours as needed for muscle spasms. 12/18/15   Kathryne Hitch, MD  oxyCODONE-acetaminophen (ROXICET) 5-325 MG tablet Take 1-2 tablets by mouth every 4 (four) hours as needed. 12/18/15   Kathryne Hitch, MD    Family History No family history on file.  Social History Social History  Substance Use Topics  . Smoking status: Current Every Day Smoker    Packs/day: 0.50    Types: Cigarettes    Start date: 04/01/1989  . Smokeless tobacco: Never Used     Comment: 10 ciggs per day  . Alcohol use 0.0 oz/week     Comment: 14  total  of 12 oz beer weekly     Allergies   Tomato   Review of Systems Review of Systems  Constitutional: Negative  for activity change, appetite change and fever.  HENT: Negative for congestion and rhinorrhea.   Respiratory: Positive for chest tightness and shortness of breath.   Cardiovascular: Positive for chest pain.  Gastrointestinal: Negative for abdominal pain, nausea and vomiting.  Genitourinary: Negative for dysuria, hematuria and urgency.  Musculoskeletal: Positive for arthralgias, back pain and myalgias.  Neurological: Negative for dizziness, weakness and headaches.   all other systems are negative except as noted in the HPI and PMH.     Physical Exam Updated Vital Signs BP 134/83 (BP Location: Left Arm)   Temp 98.6 F (37 C) (Oral)   Resp  20   Ht  (1.676 m)   Wt 74.8 kg (165 lb)   SpO2 98%   BMI 26.63 kg/m   Physical Exam  Constitutional: He is oriented to person, place, and time. He appears well-developed and well-nourished. No distress.  HENT:  Head: Normocephalic and atraumatic.  Mouth/Throat: Oropharynx is clear and moist. No oropharyngeal exudate.  Eyes: Pupils are equal, round, and reactive to light. Conjunctivae and EOM are normal.  Neck: Normal range of motion. Neck supple.  No meningismus.  Cardiovascular: Normal rate, regular rhythm, normal heart sounds and intact distal pulses.   No murmur heard. Equal radial pulses and grip strengths  Pulmonary/Chest: Effort normal and breath sounds normal. No respiratory distress. He exhibits tenderness.  Abdominal: Soft. There is tenderness. There is no rebound and no guarding.  Epigastric tenderness  Musculoskeletal: Normal range of motion. He exhibits tenderness. He exhibits no edema.  Paraspinal thoracic tenderness  Neurological: He is alert and oriented to person, place, and time. No cranial nerve deficit. He exhibits normal muscle tone. Coordination normal.   5/5 strength throughout. CN 2-12 intact.Equal grip strength.   Skin: Skin is warm.  Psychiatric: He has a normal mood and affect. His behavior is normal.  Nursing note and vitals reviewed.    ED Treatments / Results  Labs (all labs ordered are listed, but only abnormal results are displayed) Labs Reviewed  BASIC METABOLIC PANEL - Abnormal; Notable for the following:       Result Value   Glucose, Bld 114 (*)    All other components within normal limits  CBC  HEPATIC FUNCTION PANEL  LIPASE, BLOOD  D-DIMER, QUANTITATIVE (NOT AT [facility])  I-STAT TROPONIN, ED    EKG  EKG Interpretation  Date/Time:  Sunday March 01 2017 22:49:43 EDT Ventricular Rate:  61 PR Interval:    QRS Duration: 104 QT Interval:  374 QTC Calculation: 377 R Axis:   73 Text Interpretation:  Sinus rhythm LAE, consider  biatrial enlargement Left ventricular hypertrophy Abnormal T, consider ischemia, lateral leads No significant change was found Confirmed by [name] [phone]) on 03/01/2017 11:31:59 PM       Radiology Dg Chest 2 View  Result Date: 03/01/2017 CLINICAL DATA:  46 y/o  M; chest pain radiating to shoulder blades. EXAM: CHEST  2 VIEW COMPARISON:  None. FINDINGS: The heart size and mediastinal contours are within normal limits. Both lungs are clear. The visualized skeletal structures are unremarkable. IMPRESSION: No active cardiopulmonary disease. Electronically Signed   By: Mitzi Hansen M.D.   On: 03/01/2017 23:24   Ct Angio Chest/abd/pel For Dissection W And/or Wo Contrast  Result Date: 03/02/2017 CLINICAL DATA:  Sharp constant pain in the center chest radiating to the center shoulder blades for 1 day. EXAM: CT ANGIOGRAPHY CHEST, ABDOMEN AND PELVIS TECHNIQUE: Multidetector CT imaging through the chest, abdomen and pelvis was  performed using the standard protocol during bolus administration of intravenous contrast. Multiplanar reconstructed images and MIPs were obtained and reviewed to evaluate the vascular anatomy. CONTRAST:  100 mL Isovue 370 COMPARISON:  None. FINDINGS: CTA CHEST FINDINGS Cardiovascular: CT images of the chest obtained during the arterial phase after contrast administration demonstrate normal caliber thoracic aorta. The thoracic aorta is patent. No evidence of dissection or significant vascular disease. Great vessel origins are patent. The central pulmonary arteries are well opacified without evidence of significant pulmonary embolus. Normal heart size. No pericardial effusion. Mediastinum/Nodes: Esophagus is decompressed. No significant lymphadenopathy in the chest. Lungs/Pleura: Linear atelectasis in the lung bases. No airspace disease or consolidation. 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 Can't entirely exclude a tiny apical pneumothorax. No pleural  effusions. Airways are patent. Musculoskeletal: No chest wall abnormality. No acute or significant osseous findings. Review of the MIP images confirms the above findings. CTA ABDOMEN AND PELVIS FINDINGS VASCULAR Aorta: Normal caliber aorta without aneurysm, dissection, vasculitis or significant stenosis. Celiac: Patent without evidence of aneurysm, dissection, vasculitis or significant stenosis. SMA: Patent without evidence of aneurysm, dissection, vasculitis or significant stenosis. Renals: Both renal arteries are patent without evidence of aneurysm, dissection, vasculitis, fibromuscular dysplasia or significant stenosis. IMA: Patent without evidence of aneurysm, dissection, vasculitis or significant stenosis. Inflow: Patent without evidence of aneurysm, dissection, vasculitis or significant stenosis. Veins: No obvious venous abnormality within the limitations of this arterial phase study. Review of the MIP images confirms the above findings. NON-VASCULAR Hepatobiliary: No focal liver abnormality is seen. No gallstones, gallbladder wall thickening, or biliary dilatation. Pancreas: Unremarkable. No pancreatic ductal dilatation or surrounding inflammatory changes. Spleen: Normal in size without focal abnormality. Adrenals/Urinary Tract: Adrenal glands are unremarkable. Kidneys are normal, without renal calculi, focal lesion, or hydronephrosis. Evaluation of bladder is limited due to streak artifact from hip arthroplasties but no specific abnormality is identified. Stomach/Bowel: Stomach is within normal limits. Appendix appears normal. No evidence of bowel wall thickening, distention, or inflammatory changes. Lymphatic: No significant lymphadenopathy. Reproductive: Prostate is unremarkable. Other: No abdominal wall hernia or abnormality. No abdominopelvic ascites. Musculoskeletal: No acute bony abnormalities are identified. Bilateral total hip arthroplasties. Review of the MIP images confirms the above findings.  IMPRESSION: 1. Normal appearance of the thoracic and abdominal aorta. No evidence of aortic aneurysm or dissection. 2. 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 Can't entirely exclude a tiny pneumothorax. 3. Linear atelectasis in the lower lungs. 4. No acute process demonstrated in the internal abdominal organs. Electronically Signed   By: Burman Nieves M.D.   On: 03/02/2017 00:59    Procedures Procedures (including critical care time)  Medications Ordered in ED Medications - No data to display   Initial Impression / Assessment and Plan / ED Course  I have reviewed the triage vital signs and the nursing notes.  Pertinent labs & imaging results that were available during my care of the patient were reviewed by me and considered in my medical decision making (see chart for details).    5 days of constant central chest pain that radiates to the back. Denies any injury. Pain is worse with twisting and certain positions. 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  EKG shows LVH without acute ST changes.  Pain is worse with palpation of patient's chest and mid back. Equal radial pulses and grip strengths. Chest x-ray is negative.  Troponin negative. D-dimer negative. CT angiogram is negative for dissection or acute pathology. LFTs and  lipase are normal. Doubt any gallbladder pathology or pancreatitis. CTA is negative for dissection or pulmonary embolism.  Troponin negative after 5 days of constant pain. This effectively rules out ACS. Pain is likely musculoskeletal or possibly GI in origin. We'll start PPI and Carafate.  Follow-up with PCP. Return precautions discussed.   Final Clinical Impressions(s) / ED Diagnoses   Final diagnoses:  Atypical chest pain    New Prescriptions New Prescriptions   No medications on file     [name], MD 03/02/17 [phone]

## 2017-03-02 ENCOUNTER — Emergency Department (HOSPITAL_COMMUNITY): Payer: BLUE CROSS/BLUE SHIELD

## 2017-03-02 LAB — HEPATIC FUNCTION PANEL
ALT: 18 U/L (ref 17–63)
AST: 22 U/L (ref 15–41)
Albumin: 4 g/dL (ref 3.5–5.0)
Alkaline Phosphatase: 82 U/L (ref 38–126)
BILIRUBIN DIRECT: 0.1 mg/dL (ref 0.1–0.5)
BILIRUBIN INDIRECT: 0.3 mg/dL (ref 0.3–0.9)
BILIRUBIN TOTAL: 0.4 mg/dL (ref 0.3–1.2)
Total Protein: 7.2 g/dL (ref 6.5–8.1)

## 2017-03-02 LAB — LIPASE, BLOOD: LIPASE: 33 U/L (ref 11–51)

## 2017-03-02 LAB — D-DIMER, QUANTITATIVE (NOT AT ARMC): D DIMER QUANT: 0.33 ug{FEU}/mL (ref 0.00–0.50)

## 2017-03-02 MED ORDER — GI COCKTAIL ~~LOC~~
30.0000 mL | Freq: Once | ORAL | Status: AC
  Administered 2017-03-02: 30 mL via ORAL
  Filled 2017-03-02: qty 30

## 2017-03-02 MED ORDER — OMEPRAZOLE 20 MG PO CPDR: 20.0000 mg | DELAYED_RELEASE_CAPSULE | Freq: Every day | ORAL | 0 refills | Status: DC

## 2017-03-02 MED ORDER — IOPAMIDOL (ISOVUE-370) INJECTION 76%
100.0000 mL | Freq: Once | INTRAVENOUS | Status: AC | PRN
  Administered 2017-03-02: 100 mL via INTRAVENOUS

## 2017-03-02 MED ORDER — SUCRALFATE 1 G PO TABS: 1.0000 g | ORAL_TABLET | Freq: Three times a day (TID) | ORAL | 0 refills | Status: DC

## 2017-03-02 NOTE — ED Notes (Signed)
Pt given a cup of water to drink for his fluid challenge.

## 2017-03-02 NOTE — Discharge Instructions (Signed)
Your testing is negative for heart attack or blood clot in the lung. Your pain may be muscular or from the stomach or esophagus. Take the stomach medication as prescribed. Follow-up with your doctor. Return to the ED if you develop new or worsening symptoms.

## 2017-05-19 ENCOUNTER — Inpatient Hospital Stay (HOSPITAL_COMMUNITY): Payer: BLUE CROSS/BLUE SHIELD

## 2017-05-19 ENCOUNTER — Other Ambulatory Visit: Payer: Self-pay

## 2017-05-19 ENCOUNTER — Inpatient Hospital Stay (HOSPITAL_COMMUNITY)
Admission: EM | Disposition: A | Discharge status: Home Health | Payer: Self-pay | Source: Home / Self Care | Attending: Emergency Medicine

## 2017-05-19 ENCOUNTER — Inpatient Hospital Stay (HOSPITAL_COMMUNITY)
Admission: EM | Admit: 2017-05-19 | Discharge: 2017-05-28 | DRG: 280 | Disposition: A | Discharge status: Home Health | Payer: BLUE CROSS/BLUE SHIELD | Attending: Internal Medicine | Admitting: Internal Medicine

## 2017-05-19 ENCOUNTER — Emergency Department (HOSPITAL_COMMUNITY): Payer: BLUE CROSS/BLUE SHIELD

## 2017-05-19 ENCOUNTER — Encounter (HOSPITAL_COMMUNITY): Payer: Self-pay | Admitting: Emergency Medicine

## 2017-05-19 DIAGNOSIS — Z720 Tobacco use: Secondary | ICD-10-CM

## 2017-05-19 DIAGNOSIS — I428 Other cardiomyopathies: Secondary | ICD-10-CM | POA: Diagnosis present

## 2017-05-19 DIAGNOSIS — M1990 Unspecified osteoarthritis, unspecified site: Secondary | ICD-10-CM | POA: Diagnosis present

## 2017-05-19 DIAGNOSIS — L989 Disorder of the skin and subcutaneous tissue, unspecified: Secondary | ICD-10-CM | POA: Diagnosis present

## 2017-05-19 DIAGNOSIS — I5041 Acute combined systolic (congestive) and diastolic (congestive) heart failure: Secondary | ICD-10-CM

## 2017-05-19 DIAGNOSIS — M549 Dorsalgia, unspecified: Secondary | ICD-10-CM | POA: Diagnosis not present

## 2017-05-19 DIAGNOSIS — I5021 Acute systolic (congestive) heart failure: Secondary | ICD-10-CM | POA: Diagnosis not present

## 2017-05-19 DIAGNOSIS — Z91018 Allergy to other foods: Secondary | ICD-10-CM | POA: Diagnosis not present

## 2017-05-19 DIAGNOSIS — I429 Cardiomyopathy, unspecified: Secondary | ICD-10-CM | POA: Diagnosis not present

## 2017-05-19 DIAGNOSIS — I469 Cardiac arrest, cause unspecified: Secondary | ICD-10-CM | POA: Diagnosis not present

## 2017-05-19 DIAGNOSIS — Z96643 Presence of artificial hip joint, bilateral: Secondary | ICD-10-CM | POA: Diagnosis present

## 2017-05-19 DIAGNOSIS — R1310 Dysphagia, unspecified: Secondary | ICD-10-CM | POA: Diagnosis present

## 2017-05-19 DIAGNOSIS — G931 Anoxic brain damage, not elsewhere classified: Secondary | ICD-10-CM | POA: Diagnosis present

## 2017-05-19 DIAGNOSIS — I2510 Atherosclerotic heart disease of native coronary artery without angina pectoris: Secondary | ICD-10-CM | POA: Diagnosis present

## 2017-05-19 DIAGNOSIS — F17210 Nicotine dependence, cigarettes, uncomplicated: Secondary | ICD-10-CM | POA: Diagnosis present

## 2017-05-19 DIAGNOSIS — Z978 Presence of other specified devices: Secondary | ICD-10-CM

## 2017-05-19 DIAGNOSIS — R451 Restlessness and agitation: Secondary | ICD-10-CM | POA: Diagnosis not present

## 2017-05-19 DIAGNOSIS — I219 Acute myocardial infarction, unspecified: Secondary | ICD-10-CM

## 2017-05-19 DIAGNOSIS — M7989 Other specified soft tissue disorders: Secondary | ICD-10-CM | POA: Diagnosis not present

## 2017-05-19 DIAGNOSIS — L0390 Cellulitis, unspecified: Secondary | ICD-10-CM | POA: Diagnosis not present

## 2017-05-19 DIAGNOSIS — I214 Non-ST elevation (NSTEMI) myocardial infarction: Principal | ICD-10-CM | POA: Diagnosis present

## 2017-05-19 DIAGNOSIS — F1010 Alcohol abuse, uncomplicated: Secondary | ICD-10-CM

## 2017-05-19 DIAGNOSIS — J9600 Acute respiratory failure, unspecified whether with hypoxia or hypercapnia: Secondary | ICD-10-CM | POA: Diagnosis not present

## 2017-05-19 DIAGNOSIS — I472 Ventricular tachycardia: Secondary | ICD-10-CM | POA: Diagnosis present

## 2017-05-19 DIAGNOSIS — I4901 Ventricular fibrillation: Secondary | ICD-10-CM | POA: Diagnosis present

## 2017-05-19 DIAGNOSIS — I10 Essential (primary) hypertension: Secondary | ICD-10-CM

## 2017-05-19 DIAGNOSIS — L03119 Cellulitis of unspecified part of limb: Secondary | ICD-10-CM | POA: Diagnosis not present

## 2017-05-19 DIAGNOSIS — R739 Hyperglycemia, unspecified: Secondary | ICD-10-CM | POA: Diagnosis present

## 2017-05-19 DIAGNOSIS — E872 Acidosis: Secondary | ICD-10-CM | POA: Diagnosis present

## 2017-05-19 DIAGNOSIS — M79609 Pain in unspecified limb: Secondary | ICD-10-CM | POA: Diagnosis not present

## 2017-05-19 DIAGNOSIS — E876 Hypokalemia: Secondary | ICD-10-CM

## 2017-05-19 DIAGNOSIS — F1099 Alcohol use, unspecified with unspecified alcohol-induced disorder: Secondary | ICD-10-CM | POA: Diagnosis not present

## 2017-05-19 DIAGNOSIS — Z452 Encounter for adjustment and management of vascular access device: Secondary | ICD-10-CM

## 2017-05-19 DIAGNOSIS — I5043 Acute on chronic combined systolic (congestive) and diastolic (congestive) heart failure: Secondary | ICD-10-CM | POA: Diagnosis present

## 2017-05-19 DIAGNOSIS — I493 Ventricular premature depolarization: Secondary | ICD-10-CM | POA: Diagnosis not present

## 2017-05-19 DIAGNOSIS — M5126 Other intervertebral disc displacement, lumbar region: Secondary | ICD-10-CM | POA: Diagnosis present

## 2017-05-19 DIAGNOSIS — J9601 Acute respiratory failure with hypoxia: Secondary | ICD-10-CM | POA: Diagnosis present

## 2017-05-19 DIAGNOSIS — I110 Hypertensive heart disease with heart failure: Secondary | ICD-10-CM | POA: Diagnosis present

## 2017-05-19 DIAGNOSIS — Y907 Blood alcohol level of 200-239 mg/100 ml: Secondary | ICD-10-CM | POA: Diagnosis present

## 2017-05-19 DIAGNOSIS — L309 Dermatitis, unspecified: Secondary | ICD-10-CM | POA: Diagnosis present

## 2017-05-19 DIAGNOSIS — R197 Diarrhea, unspecified: Secondary | ICD-10-CM | POA: Diagnosis not present

## 2017-05-19 DIAGNOSIS — I2109 ST elevation (STEMI) myocardial infarction involving other coronary artery of anterior wall: Secondary | ICD-10-CM | POA: Diagnosis not present

## 2017-05-19 DIAGNOSIS — Z781 Physical restraint status: Secondary | ICD-10-CM

## 2017-05-19 DIAGNOSIS — Z716 Tobacco abuse counseling: Secondary | ICD-10-CM

## 2017-05-19 DIAGNOSIS — I462 Cardiac arrest due to underlying cardiac condition: Secondary | ICD-10-CM | POA: Diagnosis present

## 2017-05-19 DIAGNOSIS — Z9289 Personal history of other medical treatment: Secondary | ICD-10-CM

## 2017-05-19 DIAGNOSIS — I447 Left bundle-branch block, unspecified: Secondary | ICD-10-CM | POA: Diagnosis present

## 2017-05-19 DIAGNOSIS — Z008 Encounter for other general examination: Secondary | ICD-10-CM | POA: Diagnosis not present

## 2017-05-19 DIAGNOSIS — I213 ST elevation (STEMI) myocardial infarction of unspecified site: Secondary | ICD-10-CM | POA: Diagnosis not present

## 2017-05-19 HISTORY — PX: LEFT HEART CATH AND CORONARY ANGIOGRAPHY: CATH118249

## 2017-05-19 HISTORY — PX: CORONARY/GRAFT ACUTE MI REVASCULARIZATION: CATH118305

## 2017-05-19 LAB — RAPID URINE DRUG SCREEN, HOSP PERFORMED
Amphetamines: NOT DETECTED
BENZODIAZEPINES: POSITIVE — AB
Barbiturates: NOT DETECTED
COCAINE: NOT DETECTED
Opiates: NOT DETECTED
Tetrahydrocannabinol: NOT DETECTED

## 2017-05-19 LAB — POCT I-STAT, CHEM 8
BUN: 9 mg/dL (ref 6–20)
BUN: 12 mg/dL (ref 6–20)
BUN: 14 mg/dL (ref 6–20)
CALCIUM ION: 1.17 mmol/L (ref 1.15–1.40)
CHLORIDE: 109 mmol/L (ref 101–111)
CREATININE: 1.2 mg/dL (ref 0.61–1.24)
CREATININE: 0.9 mg/dL (ref 0.61–1.24)
Calcium, Ion: 0.99 mmol/L — ABNORMAL LOW (ref 1.15–1.40)
Calcium, Ion: 1.06 mmol/L — ABNORMAL LOW (ref 1.15–1.40)
Chloride: 110 mmol/L (ref 101–111)
Chloride: 109 mmol/L (ref 101–111)
Creatinine, Ser: 1 mg/dL (ref 0.61–1.24)
Glucose, Bld: 194 mg/dL — ABNORMAL HIGH (ref 65–99)
Glucose, Bld: 211 mg/dL — ABNORMAL HIGH (ref 65–99)
Glucose, Bld: 161 mg/dL — ABNORMAL HIGH (ref 65–99)
HCT: 43 % (ref 39.0–52.0)
HCT: 48 % (ref 39.0–52.0)
HEMATOCRIT: 49 % (ref 39.0–52.0)
Hemoglobin: 14.6 g/dL (ref 13.0–17.0)
Hemoglobin: 16.7 g/dL (ref 13.0–17.0)
Hemoglobin: 16.3 g/dL (ref 13.0–17.0)
POTASSIUM: 3.5 mmol/L (ref 3.5–5.1)
Potassium: 2.7 mmol/L — CL (ref 3.5–5.1)
Potassium: 3.9 mmol/L (ref 3.5–5.1)
SODIUM: 144 mmol/L (ref 135–145)
SODIUM: 148 mmol/L — AB (ref 135–145)
Sodium: 143 mmol/L (ref 135–145)
TCO2: 15 mmol/L — AB (ref 22–32)
TCO2: 18 mmol/L — AB (ref 22–32)
TCO2: 22 mmol/L (ref 22–32)

## 2017-05-19 LAB — URINALYSIS, ROUTINE W REFLEX MICROSCOPIC
BILIRUBIN URINE: NEGATIVE
Glucose, UA: 50 mg/dL — AB
KETONES UR: NEGATIVE mg/dL
Leukocytes, UA: NEGATIVE
Nitrite: NEGATIVE
Protein, ur: 100 mg/dL — AB
Specific Gravity, Urine: 1.039 — ABNORMAL HIGH (ref 1.005–1.030)
pH: 5 (ref 5.0–8.0)

## 2017-05-19 LAB — COMPREHENSIVE METABOLIC PANEL
ALT: 24 U/L (ref 17–63)
AST: 51 U/L — ABNORMAL HIGH (ref 15–41)
Albumin: 3.3 g/dL — ABNORMAL LOW (ref 3.5–5.0)
Alkaline Phosphatase: 66 U/L (ref 38–126)
Anion gap: 17 — ABNORMAL HIGH (ref 5–15)
BUN: 9 mg/dL (ref 6–20)
CALCIUM: 7.6 mg/dL — AB (ref 8.9–10.3)
CHLORIDE: 109 mmol/L (ref 101–111)
CO2: 13 mmol/L — AB (ref 22–32)
CREATININE: 1.12 mg/dL (ref 0.61–1.24)
Glucose, Bld: 180 mg/dL — ABNORMAL HIGH (ref 65–99)
Potassium: 2.8 mmol/L — ABNORMAL LOW (ref 3.5–5.1)
SODIUM: 139 mmol/L (ref 135–145)
Total Bilirubin: 0.8 mg/dL (ref 0.3–1.2)
Total Protein: 6.3 g/dL — ABNORMAL LOW (ref 6.5–8.1)

## 2017-05-19 LAB — MAGNESIUM
MAGNESIUM: 1.8 mg/dL (ref 1.7–2.4)
Magnesium: 2 mg/dL (ref 1.7–2.4)
Magnesium: 2.8 mg/dL — ABNORMAL HIGH (ref 1.7–2.4)
Magnesium: 2.3 mg/dL (ref 1.7–2.4)

## 2017-05-19 LAB — I-STAT CG4 LACTIC ACID, ED: LACTIC ACID, VENOUS: 7.86 mmol/L — AB (ref 0.5–1.9)

## 2017-05-19 LAB — BASIC METABOLIC PANEL
Anion gap: 8 (ref 5–15)
Anion gap: 9 (ref 5–15)
BUN: 15 mg/dL (ref 6–20)
BUN: 15 mg/dL (ref 6–20)
CHLORIDE: 113 mmol/L — AB (ref 101–111)
CO2: 22 mmol/L (ref 22–32)
CO2: 19 mmol/L — ABNORMAL LOW (ref 22–32)
CREATININE: 0.93 mg/dL (ref 0.61–1.24)
CREATININE: 1 mg/dL (ref 0.61–1.24)
Calcium: 7.9 mg/dL — ABNORMAL LOW (ref 8.9–10.3)
Calcium: 7.6 mg/dL — ABNORMAL LOW (ref 8.9–10.3)
Chloride: 114 mmol/L — ABNORMAL HIGH (ref 101–111)
GFR calc Af Amer: >60 mL/min (ref >60)
GFR calc Af Amer: >60 mL/min (ref >60)
GFR calc non Af Amer: >60 mL/min (ref >60)
GLUCOSE: 163 mg/dL — AB (ref 65–99)
GLUCOSE: 103 mg/dL — AB (ref 65–99)
Potassium: 3 mmol/L — ABNORMAL LOW (ref 3.5–5.1)
Potassium: 5 mmol/L (ref 3.5–5.1)
Sodium: 144 mmol/L (ref 135–145)
Sodium: 141 mmol/L (ref 135–145)

## 2017-05-19 LAB — CBC
HCT: 45.4 % (ref 39.0–52.0)
HEMOGLOBIN: 16 g/dL (ref 13.0–17.0)
MCH: 30.9 pg (ref 26.0–34.0)
MCHC: 35.2 g/dL (ref 30.0–36.0)
MCV: 87.8 fL (ref 78.0–100.0)
PLATELETS: 256 10*3/uL (ref 150–400)
RBC: 5.17 MIL/uL (ref 4.22–5.81)
RDW: 14.1 % (ref 11.5–15.5)
WBC: 12.3 10*3/uL — AB (ref 4.0–10.5)

## 2017-05-19 LAB — POCT I-STAT 3, ART BLOOD GAS (G3+)
ACID-BASE DEFICIT: 10 mmol/L — AB (ref 0.0–2.0)
Acid-base deficit: 12 mmol/L — ABNORMAL HIGH (ref 0.0–2.0)
Acid-base deficit: 10 mmol/L — ABNORMAL HIGH (ref 0.0–2.0)
BICARBONATE: 17 mmol/L — AB (ref 20.0–28.0)
Bicarbonate: 16 mmol/L — ABNORMAL LOW (ref 20.0–28.0)
Bicarbonate: 17.2 mmol/L — ABNORMAL LOW (ref 20.0–28.0)
O2 SAT: 99 %
O2 Saturation: 100 %
O2 Saturation: 100 %
PCO2 ART: 36.2 mmHg (ref 32.0–48.0)
PCO2 ART: 40.1 mmHg (ref 32.0–48.0)
PH ART: 7.196 — AB (ref 7.350–7.450)
PH ART: 7.271 — AB (ref 7.350–7.450)
PO2 ART: 243 mmHg — AB (ref 83.0–108.0)
PO2 ART: 156 mmHg — AB (ref 83.0–108.0)
Patient temperature: 34.9
Patient temperature: 37.1
TCO2: 17 mmol/L — ABNORMAL LOW (ref 22–32)
TCO2: 18 mmol/L — ABNORMAL LOW (ref 22–32)
TCO2: 18 mmol/L — ABNORMAL LOW (ref 22–32)
pCO2 arterial: 39.9 mmHg (ref 32.0–48.0)
pH, Arterial: 7.242 — ABNORMAL LOW (ref 7.350–7.450)
pO2, Arterial: 193 mmHg — ABNORMAL HIGH (ref 83.0–108.0)

## 2017-05-19 LAB — ABO/RH: ABO/RH(D): O POS

## 2017-05-19 LAB — POCT I-STAT TROPONIN I: Troponin i, poc: 0.1 ng/mL (ref 0.00–0.08)

## 2017-05-19 LAB — PROTIME-INR
INR: 1.03
INR: 0.98
PROTHROMBIN TIME: 13.4 s (ref 11.4–15.2)
PROTHROMBIN TIME: 12.9 s (ref 11.4–15.2)

## 2017-05-19 LAB — CBC WITH DIFFERENTIAL/PLATELET
BASOS ABS: 0 10*3/uL (ref 0.0–0.1)
BASOS PCT: 1 %
EOS ABS: 0.1 10*3/uL (ref 0.0–0.7)
EOS PCT: 1 %
HCT: 42.8 % (ref 39.0–52.0)
Hemoglobin: 14.8 g/dL (ref 13.0–17.0)
Lymphocytes Relative: 67 %
Lymphs Abs: 3.4 10*3/uL (ref 0.7–4.0)
MCH: 30.3 pg (ref 26.0–34.0)
MCHC: 34.6 g/dL (ref 30.0–36.0)
MCV: 87.5 fL (ref 78.0–100.0)
MONO ABS: 0 10*3/uL — AB (ref 0.1–1.0)
MONOS PCT: 0 %
NEUTROS ABS: 1.6 10*3/uL — AB (ref 1.7–7.7)
Neutrophils Relative %: 31 %
Platelets: 230 10*3/uL (ref 150–400)
RBC: 4.89 MIL/uL (ref 4.22–5.81)
RDW: 13.9 % (ref 11.5–15.5)
WBC: 5.1 10*3/uL (ref 4.0–10.5)

## 2017-05-19 LAB — GLUCOSE, CAPILLARY
GLUCOSE-CAPILLARY: 157 mg/dL — AB (ref 65–99)
GLUCOSE-CAPILLARY: 140 mg/dL — AB (ref 65–99)
GLUCOSE-CAPILLARY: 135 mg/dL — AB (ref 65–99)
GLUCOSE-CAPILLARY: 126 mg/dL — AB (ref 65–99)
Glucose-Capillary: 102 mg/dL — ABNORMAL HIGH (ref 65–99)
Glucose-Capillary: 109 mg/dL — ABNORMAL HIGH (ref 65–99)
Glucose-Capillary: 176 mg/dL — ABNORMAL HIGH (ref 65–99)

## 2017-05-19 LAB — TROPONIN I
TROPONIN I: 2.32 ng/mL — AB (ref <0.03)
TROPONIN I: 12.38 ng/mL — AB (ref <0.03)
Troponin I: 0.17 ng/mL (ref <0.03)
Troponin I: 25.35 ng/mL (ref <0.03)

## 2017-05-19 LAB — COOXEMETRY PANEL
CARBOXYHEMOGLOBIN: 0.5 % (ref 0.5–1.5)
Methemoglobin: 0.9 % (ref 0.0–1.5)
O2 SAT: 49.2 %
Total hemoglobin: 15.9 g/dL (ref 12.0–16.0)

## 2017-05-19 LAB — ETHANOL: ALCOHOL ETHYL (B): 205 mg/dL — AB (ref <10)

## 2017-05-19 LAB — TYPE AND SCREEN
ABO/RH(D): O POS
Antibody Screen: NEGATIVE

## 2017-05-19 LAB — APTT
APTT: 91 s — AB (ref 24–36)
aPTT: 32 seconds (ref 24–36)

## 2017-05-19 LAB — CBG MONITORING, ED: Glucose-Capillary: 168 mg/dL — ABNORMAL HIGH (ref 65–99)

## 2017-05-19 LAB — CREATININE, SERUM
CREATININE: 1.03 mg/dL (ref 0.61–1.24)
GFR calc Af Amer: >60 mL/min (ref >60)

## 2017-05-19 LAB — MRSA PCR SCREENING: MRSA by PCR: NEGATIVE

## 2017-05-19 LAB — PHOSPHORUS: PHOSPHORUS: 3.5 mg/dL (ref 2.5–4.6)

## 2017-05-19 LAB — LACTIC ACID, PLASMA: LACTIC ACID, VENOUS: 5 mmol/L — AB (ref 0.5–1.9)

## 2017-05-19 SURGERY — CORONARY/GRAFT ACUTE MI REVASCULARIZATION
Anesthesia: LOCAL

## 2017-05-19 MED ORDER — HEPARIN SODIUM (PORCINE) 5000 UNIT/ML IJ SOLN: 5000.0000 [IU] | Freq: Three times a day (TID) | INTRAMUSCULAR | Status: DC

## 2017-05-19 MED ORDER — HYDRALAZINE HCL 20 MG/ML IJ SOLN
10.0000 mg | INTRAMUSCULAR | Status: DC | PRN
  Administered 2017-05-19: 10 mg via INTRAVENOUS
  Administered 2017-05-20 – 2017-05-26 (×3): 20 mg via INTRAVENOUS
  Filled 2017-05-19 (×4): qty 1

## 2017-05-19 MED ORDER — SODIUM BICARBONATE 8.4 % IV SOLN
INTRAVENOUS | Status: AC
  Filled 2017-05-19: qty 50

## 2017-05-19 MED ORDER — CISATRACURIUM BOLUS VIA INFUSION
0.1000 mg/kg | Freq: Once | INTRAVENOUS | Status: AC
  Administered 2017-05-19: 6.8 mg via INTRAVENOUS
  Filled 2017-05-19: qty 7

## 2017-05-19 MED ORDER — CHLORHEXIDINE GLUCONATE 0.12% ORAL RINSE (MEDLINE KIT)
15.0000 mL | Freq: Two times a day (BID) | OROMUCOSAL | Status: DC
  Administered 2017-05-19 – 2017-05-22 (×7): 15 mL via OROMUCOSAL

## 2017-05-19 MED ORDER — MAGNESIUM SULFATE 2 GM/50ML IV SOLN
INTRAVENOUS | Status: AC
  Administered 2017-05-19: 2 g via INTRAVENOUS
  Filled 2017-05-19: qty 50

## 2017-05-19 MED ORDER — NITROGLYCERIN 1 MG/10 ML FOR IR/CATH LAB
INTRA_ARTERIAL | Status: AC
  Filled 2017-05-19: qty 10

## 2017-05-19 MED ORDER — SODIUM CHLORIDE 0.9 % IV SOLN
2.0000 g | Freq: Once | INTRAVENOUS | Status: AC
  Administered 2017-05-19 (×2): 2 g via INTRAVENOUS
  Filled 2017-05-19: qty 20

## 2017-05-19 MED ORDER — FENTANYL CITRATE (PF) 100 MCG/2ML IJ SOLN
25.0000 ug | INTRAMUSCULAR | Status: DC | PRN
  Administered 2017-05-19 (×4): 100 ug via INTRAVENOUS
  Filled 2017-05-19 (×3): qty 2

## 2017-05-19 MED ORDER — POTASSIUM CHLORIDE 10 MEQ/100ML IV SOLN
INTRAVENOUS | Status: AC
  Filled 2017-05-19: qty 100

## 2017-05-19 MED ORDER — SODIUM CHLORIDE 0.9 % IV SOLN: 250.0000 mL | INTRAVENOUS | Status: DC | PRN

## 2017-05-19 MED ORDER — MIDAZOLAM HCL 2 MG/2ML IJ SOLN
INTRAMUSCULAR | Status: DC | PRN
  Administered 2017-05-19: 2 mg via INTRAVENOUS

## 2017-05-19 MED ORDER — POTASSIUM CHLORIDE 10 MEQ/50ML IV SOLN
10.0000 meq | INTRAVENOUS | Status: AC
  Administered 2017-05-19 (×4): 10 meq via INTRAVENOUS

## 2017-05-19 MED ORDER — LIDOCAINE HCL (PF) 1 % IJ SOLN
INTRAMUSCULAR | Status: DC | PRN
  Administered 2017-05-19: 2 mL

## 2017-05-19 MED ORDER — SODIUM BICARBONATE 8.4 % IV SOLN
INTRAVENOUS | Status: AC
  Administered 2017-05-19: 50 meq
  Filled 2017-05-19: qty 100

## 2017-05-19 MED ORDER — FENTANYL 2500MCG IN NS 250ML (10MCG/ML) PREMIX INFUSION
0.0000 ug/h | INTRAVENOUS | Status: DC
  Administered 2017-05-19: 100 ug/h via INTRAVENOUS
  Filled 2017-05-19: qty 250

## 2017-05-19 MED ORDER — POTASSIUM CHLORIDE 10 MEQ/100ML IV SOLN
10.0000 meq | INTRAVENOUS | Status: AC
  Administered 2017-05-19 (×4): 10 meq via INTRAVENOUS
  Filled 2017-05-19 (×2): qty 100

## 2017-05-19 MED ORDER — MIDAZOLAM HCL 2 MG/2ML IJ SOLN: 2.0000 mg | Freq: Once | INTRAMUSCULAR | Status: DC | PRN

## 2017-05-19 MED ORDER — MIDAZOLAM HCL 2 MG/2ML IJ SOLN
INTRAMUSCULAR | Status: AC
  Administered 2017-05-19: 2 mg via INTRAVENOUS
  Filled 2017-05-19: qty 2

## 2017-05-19 MED ORDER — SODIUM CHLORIDE 0.9% FLUSH: 3.0000 mL | INTRAVENOUS | Status: DC | PRN

## 2017-05-19 MED ORDER — MIDAZOLAM HCL 2 MG/2ML IJ SOLN
2.0000 mg | Freq: Once | INTRAMUSCULAR | Status: AC
  Administered 2017-05-19: 2 mg via INTRAVENOUS

## 2017-05-19 MED ORDER — LIDOCAINE BOLUS VIA INFUSION
50.0000 mg | Freq: Once | INTRAVENOUS | Status: AC
  Administered 2017-05-19: 50 mg via INTRAVENOUS
  Filled 2017-05-19: qty 52

## 2017-05-19 MED ORDER — PANTOPRAZOLE SODIUM 40 MG IV SOLR
40.0000 mg | Freq: Every day | INTRAVENOUS | Status: DC
  Administered 2017-05-19 – 2017-05-22 (×4): 40 mg via INTRAVENOUS
  Filled 2017-05-19 (×4): qty 40

## 2017-05-19 MED ORDER — INSULIN ASPART 100 UNIT/ML ~~LOC~~ SOLN
1.0000 [IU] | SUBCUTANEOUS | Status: DC
  Administered 2017-05-19 – 2017-05-23 (×5): 1 [IU] via SUBCUTANEOUS

## 2017-05-19 MED ORDER — SODIUM CHLORIDE 0.9% FLUSH: 10.0000 mL | INTRAVENOUS | Status: DC | PRN

## 2017-05-19 MED ORDER — HEPARIN (PORCINE) IN NACL 2-0.9 UNIT/ML-% IJ SOLN
INTRAMUSCULAR | Status: AC
  Filled 2017-05-19: qty 1000

## 2017-05-19 MED ORDER — METOPROLOL TARTRATE 5 MG/5ML IV SOLN
2.5000 mg | Freq: Four times a day (QID) | INTRAVENOUS | Status: DC
  Administered 2017-05-19 – 2017-05-20 (×2): 2.5 mg via INTRAVENOUS
  Filled 2017-05-19 (×2): qty 5

## 2017-05-19 MED ORDER — ACETAMINOPHEN 325 MG PO TABS
650.0000 mg | ORAL_TABLET | ORAL | Status: DC | PRN
  Administered 2017-05-20: 650 mg via ORAL
  Filled 2017-05-19: qty 2

## 2017-05-19 MED ORDER — AMIODARONE LOAD VIA INFUSION
150.0000 mg | Freq: Once | INTRAVENOUS | Status: AC
  Administered 2017-05-19: 150 mg via INTRAVENOUS

## 2017-05-19 MED ORDER — POTASSIUM CHLORIDE 10 MEQ/50ML IV SOLN
INTRAVENOUS | Status: AC
  Filled 2017-05-19: qty 200

## 2017-05-19 MED ORDER — FENTANYL CITRATE (PF) 100 MCG/2ML IJ SOLN: 100.0000 ug | Freq: Once | INTRAMUSCULAR | Status: DC | PRN

## 2017-05-19 MED ORDER — VERAPAMIL HCL 2.5 MG/ML IV SOLN
INTRAVENOUS | Status: AC
  Filled 2017-05-19: qty 2

## 2017-05-19 MED ORDER — ORAL CARE MOUTH RINSE
15.0000 mL | OROMUCOSAL | Status: DC
  Administered 2017-05-19 – 2017-05-22 (×25): 15 mL via OROMUCOSAL

## 2017-05-19 MED ORDER — AMIODARONE IV BOLUS ONLY 150 MG/100ML
150.0000 mg | Freq: Once | INTRAVENOUS | Status: AC
  Administered 2017-05-19: 150 mg via INTRAVENOUS

## 2017-05-19 MED ORDER — SODIUM CHLORIDE 0.9 % IV SOLN
2.0000 mg/h | INTRAVENOUS | Status: DC
  Administered 2017-05-19: 2 mg/h via INTRAVENOUS
  Administered 2017-05-20: 3 mg/h via INTRAVENOUS
  Filled 2017-05-19 (×2): qty 10

## 2017-05-19 MED ORDER — MAGNESIUM SULFATE 2 GM/50ML IV SOLN
2.0000 g | Freq: Once | INTRAVENOUS | Status: AC
  Administered 2017-05-19: 2 g via INTRAVENOUS

## 2017-05-19 MED ORDER — MIDAZOLAM HCL 2 MG/2ML IJ SOLN
2.0000 mg | INTRAMUSCULAR | Status: DC | PRN
  Administered 2017-05-19 (×6): 2 mg via INTRAVENOUS
  Filled 2017-05-19 (×5): qty 2

## 2017-05-19 MED ORDER — AMIODARONE HCL IN DEXTROSE 360-4.14 MG/200ML-% IV SOLN
60.0000 mg/h | INTRAVENOUS | Status: DC
  Filled 2017-05-19: qty 200

## 2017-05-19 MED ORDER — MAGNESIUM SULFATE 2 GM/50ML IV SOLN
2.0000 g | Freq: Once | INTRAVENOUS | Status: AC
  Administered 2017-05-19: 2 g via INTRAVENOUS
  Filled 2017-05-19: qty 50

## 2017-05-19 MED ORDER — SODIUM CHLORIDE 0.9 % IV SOLN: INTRAVENOUS | Status: DC

## 2017-05-19 MED ORDER — SODIUM BICARBONATE 8.4 % IV SOLN
100.0000 meq | Freq: Once | INTRAVENOUS | Status: AC
Start: 2017-05-19 — End: 2017-05-19
  Administered 2017-05-19: 100 meq via INTRAVENOUS

## 2017-05-19 MED ORDER — FENTANYL CITRATE (PF) 100 MCG/2ML IJ SOLN
INTRAMUSCULAR | Status: AC
  Filled 2017-05-19: qty 2

## 2017-05-19 MED ORDER — VERAPAMIL HCL 2.5 MG/ML IV SOLN
INTRAVENOUS | Status: DC | PRN
  Administered 2017-05-19: 03:00:00 via INTRA_ARTERIAL

## 2017-05-19 MED ORDER — IOPAMIDOL (ISOVUE-370) INJECTION 76%
INTRAVENOUS | Status: AC
  Filled 2017-05-19: qty 100

## 2017-05-19 MED ORDER — SODIUM CHLORIDE 0.9 % IV SOLN
3.0000 ug/kg/min | INTRAVENOUS | Status: DC
  Administered 2017-05-19 – 2017-05-20 (×2): 1 ug/kg/min via INTRAVENOUS
  Filled 2017-05-19 (×2): qty 20

## 2017-05-19 MED ORDER — ASPIRIN 81 MG PO CHEW
81.0000 mg | CHEWABLE_TABLET | Freq: Every day | ORAL | Status: DC
  Administered 2017-05-19 – 2017-05-28 (×8): 81 mg via ORAL
  Filled 2017-05-19 (×8): qty 1

## 2017-05-19 MED ORDER — LIDOCAINE HCL (PF) 1 % IJ SOLN
INTRAMUSCULAR | Status: AC
  Filled 2017-05-19: qty 30

## 2017-05-19 MED ORDER — AMIODARONE HCL IN DEXTROSE 360-4.14 MG/200ML-% IV SOLN
30.0000 mg/h | INTRAVENOUS | Status: AC
  Administered 2017-05-19 – 2017-05-20 (×3): 60 mg/h via INTRAVENOUS
  Administered 2017-05-20 – 2017-05-23 (×6): 30 mg/h via INTRAVENOUS
  Filled 2017-05-19 (×9): qty 200

## 2017-05-19 MED ORDER — ARTIFICIAL TEARS OPHTHALMIC OINT
1.0000 "application " | TOPICAL_OINTMENT | Freq: Three times a day (TID) | OPHTHALMIC | Status: DC
  Administered 2017-05-19 – 2017-05-20 (×3): 1 via OPHTHALMIC
  Filled 2017-05-19: qty 3.5

## 2017-05-19 MED ORDER — FENTANYL CITRATE (PF) 100 MCG/2ML IJ SOLN
100.0000 ug | Freq: Once | INTRAMUSCULAR | Status: AC
  Administered 2017-05-19: 100 ug via INTRAVENOUS

## 2017-05-19 MED ORDER — CHLORHEXIDINE GLUCONATE CLOTH 2 % EX PADS
6.0000 | MEDICATED_PAD | Freq: Every day | CUTANEOUS | Status: DC
  Administered 2017-05-20 – 2017-05-22 (×3): 6 via TOPICAL

## 2017-05-19 MED ORDER — POTASSIUM CHLORIDE 10 MEQ/100ML IV SOLN
INTRAVENOUS | Status: AC
  Filled 2017-05-19: qty 400

## 2017-05-19 MED ORDER — MIDAZOLAM HCL 2 MG/2ML IJ SOLN
INTRAMUSCULAR | Status: AC
  Filled 2017-05-19: qty 2

## 2017-05-19 MED ORDER — SODIUM BICARBONATE 8.4 % IV SOLN
INTRAVENOUS | Status: DC | PRN
  Administered 2017-05-19: 50 meq via INTRAVENOUS

## 2017-05-19 MED ORDER — POTASSIUM CHLORIDE 20 MEQ/15ML (10%) PO SOLN
40.0000 meq | Freq: Once | ORAL | Status: AC
  Administered 2017-05-19: 40 meq
  Filled 2017-05-19: qty 30

## 2017-05-19 MED ORDER — FENTANYL BOLUS VIA INFUSION
50.0000 ug | INTRAVENOUS | Status: DC | PRN
  Administered 2017-05-20 (×4): 50 ug via INTRAVENOUS
  Filled 2017-05-19: qty 50

## 2017-05-19 MED ORDER — LIDOCAINE IN D5W 4-5 MG/ML-% IV SOLN
2.0000 mg/min | INTRAVENOUS | Status: DC
  Administered 2017-05-19 – 2017-05-20 (×3): 2 mg/min via INTRAVENOUS
  Filled 2017-05-19 (×4): qty 500

## 2017-05-19 MED ORDER — AMIODARONE HCL 150 MG/3ML IV SOLN
INTRAVENOUS | Status: DC | PRN
  Administered 2017-05-19: 150 mg via INTRAVENOUS

## 2017-05-19 MED ORDER — SODIUM CHLORIDE 0.9 % IV SOLN
0.0000 ug/h | INTRAVENOUS | Status: DC
  Filled 2017-05-19: qty 50

## 2017-05-19 MED ORDER — SODIUM CHLORIDE 0.9% FLUSH
10.0000 mL | Freq: Two times a day (BID) | INTRAVENOUS | Status: DC
  Administered 2017-05-19 – 2017-05-20 (×4): 10 mL
  Administered 2017-05-21: 20 mL
  Administered 2017-05-21 – 2017-05-22 (×2): 10 mL

## 2017-05-19 MED ORDER — IOPAMIDOL (ISOVUE-370) INJECTION 76%
INTRAVENOUS | Status: DC | PRN
  Administered 2017-05-19: 85 mL via INTRA_ARTERIAL

## 2017-05-19 MED ORDER — SODIUM CHLORIDE 0.9% FLUSH: 3.0000 mL | Freq: Two times a day (BID) | INTRAVENOUS | Status: DC

## 2017-05-19 MED ORDER — HEPARIN SODIUM (PORCINE) 1000 UNIT/ML IJ SOLN
INTRAMUSCULAR | Status: DC | PRN
  Administered 2017-05-19: 4000 [IU] via INTRAVENOUS

## 2017-05-19 MED ORDER — ASPIRIN 300 MG RE SUPP: 300.0000 mg | RECTAL | Status: DC

## 2017-05-19 MED ORDER — HEPARIN SODIUM (PORCINE) 1000 UNIT/ML IJ SOLN
INTRAMUSCULAR | Status: AC
  Filled 2017-05-19: qty 1

## 2017-05-19 MED ORDER — FENTANYL 2500MCG IN NS 250ML (10MCG/ML) PREMIX INFUSION
0.0000 ug/h | INTRAVENOUS | Status: DC
  Administered 2017-05-19 – 2017-05-20 (×2): 200 ug/h via INTRAVENOUS
  Administered 2017-05-21: 225 ug/h via INTRAVENOUS
  Administered 2017-05-21: 250 ug/h via INTRAVENOUS
  Filled 2017-05-19 (×4): qty 250

## 2017-05-19 MED ORDER — MIDAZOLAM BOLUS VIA INFUSION
2.0000 mg | INTRAVENOUS | Status: DC | PRN
  Filled 2017-05-19: qty 2

## 2017-05-19 MED ORDER — NOREPINEPHRINE BITARTRATE 1 MG/ML IV SOLN
0.0000 ug/min | INTRAVENOUS | Status: DC
  Administered 2017-05-19: 2 ug/min via INTRAVENOUS
  Filled 2017-05-19: qty 4

## 2017-05-19 MED ORDER — ONDANSETRON HCL 4 MG/2ML IJ SOLN: 4.0000 mg | Freq: Four times a day (QID) | INTRAMUSCULAR | Status: DC | PRN

## 2017-05-19 MED ORDER — AMIODARONE HCL 150 MG/3ML IV SOLN
INTRAVENOUS | Status: AC
  Filled 2017-05-19: qty 3

## 2017-05-19 MED ORDER — SODIUM CHLORIDE 0.9 % IV SOLN
3.0000 ug/kg/min | INTRAVENOUS | Status: DC
  Filled 2017-05-19: qty 20

## 2017-05-19 MED ORDER — HEPARIN (PORCINE) IN NACL 2-0.9 UNIT/ML-% IJ SOLN
INTRAMUSCULAR | Status: AC | PRN
  Administered 2017-05-19: 1000 mL

## 2017-05-19 MED ORDER — HEPARIN SODIUM (PORCINE) 5000 UNIT/ML IJ SOLN
5000.0000 [IU] | Freq: Three times a day (TID) | INTRAMUSCULAR | Status: DC
  Administered 2017-05-19 – 2017-05-28 (×20): 5000 [IU] via SUBCUTANEOUS
  Filled 2017-05-19 (×21): qty 1

## 2017-05-19 MED ORDER — SODIUM CHLORIDE 0.9 % IV SOLN
INTRAVENOUS | Status: AC | PRN
  Administered 2017-05-19: 68 mL/h via INTRAVENOUS

## 2017-05-19 MED ORDER — CHLORHEXIDINE GLUCONATE CLOTH 2 % EX PADS
6.0000 | MEDICATED_PAD | Freq: Every day | CUTANEOUS | Status: DC
  Administered 2017-05-19: 6 via TOPICAL

## 2017-05-19 MED ORDER — SODIUM CHLORIDE 0.9 % IV SOLN
100.0000 ug/h | INTRAVENOUS | Status: DC
  Filled 2017-05-19: qty 50

## 2017-05-19 MED ORDER — LIDOCAINE HCL (CARDIAC) 20 MG/ML IV SOLN
INTRAVENOUS | Status: AC
  Filled 2017-05-19: qty 5

## 2017-05-19 MED ORDER — AMIODARONE HCL IN DEXTROSE 360-4.14 MG/200ML-% IV SOLN
60.0000 mg/h | INTRAVENOUS | Status: DC
  Administered 2017-05-19: 60 mg/h via INTRAVENOUS
  Filled 2017-05-19 (×3): qty 200

## 2017-05-19 SURGICAL SUPPLY — 12 items
CATH 5FR JL3.5 JR4 ANG PIG MP (CATHETERS) ×2 IMPLANT
DEVICE RAD COMP TR BAND LRG (VASCULAR PRODUCTS) ×2 IMPLANT
GLIDESHEATH SLEND SS 6F .021 (SHEATH) ×2 IMPLANT
GUIDEWIRE INQWIRE 1.5J.035X260 (WIRE) ×1 IMPLANT
HOVERMATT SINGLE USE (MISCELLANEOUS) ×2 IMPLANT
INQWIRE 1.5J .035X260CM (WIRE) ×2
KIT ENCORE 26 ADVANTAGE (KITS) ×4 IMPLANT
KIT HEART LEFT (KITS) ×2 IMPLANT
PACK CARDIAC CATHETERIZATION (CUSTOM PROCEDURE TRAY) ×2 IMPLANT
SYR MEDRAD MARK V 150ML (SYRINGE) ×2 IMPLANT
TRANSDUCER W/STOPCOCK (MISCELLANEOUS) ×2 IMPLANT
TUBING CIL FLEX 10 FLL-RA (TUBING) ×2 IMPLANT

## 2017-05-19 NOTE — Progress Notes (Signed)
  Amiodarone Drug - Drug Interaction Consult Note  Recommendations: - No drug interactions were indentified  Amiodarone is metabolized by the cytochrome P450 system and therefore has the potential to cause many drug interactions. Amiodarone has an average plasma half-life of 50 days (range 20 to 100 days).   There is potential for drug interactions to occur several weeks or months after stopping treatment and the onset of drug interactions may be slow after initiating amiodarone.   []  Statins: Increased risk of myopathy. Simvastatin- restrict dose to 20mg  daily. Other statins: counsel patients to report any muscle pain or weakness immediately.  []  Anticoagulants: Amiodarone can increase anticoagulant effect. Consider warfarin dose reduction. Patients should be monitored closely and the dose of anticoagulant altered accordingly, remembering that amiodarone levels take several weeks to stabilize.  []  Antiepileptics: Amiodarone can increase plasma concentration of phenytoin, the dose should be reduced. Note that small changes in phenytoin dose can result in large changes in levels. Monitor patient and counsel on signs of toxicity.  []  Beta blockers: increased risk of bradycardia, AV block and myocardial depression. Sotalol - avoid concomitant use.  []   Calcium channel blockers (diltiazem and verapamil): increased risk of bradycardia, AV block and myocardial depression.  []   Cyclosporine: Amiodarone increases levels of cyclosporine. Reduced dose of cyclosporine is recommended.  []  Digoxin dose should be halved when amiodarone is started.  []  Diuretics: increased risk of cardiotoxicity if hypokalemia occurs.  []  Oral hypoglycemic agents (glyburide, glipizide, glimepiride): increased risk of hypoglycemia. Patient's glucose levels should be monitored closely when initiating amiodarone therapy.   []  Drugs that prolong the QT interval:  Torsades de pointes risk may be increased with concurrent use  - avoid if possible.  Monitor QTc, also keep magnesium/potassium WNL if concurrent therapy can't be avoided. Marland Kitchen Antibiotics: e.g. fluoroquinolones, erythromycin. . Antiarrhythmics: e.g. quinidine, procainamide, disopyramide, sotalol. . Antipsychotics: e.g. phenothiazines, haloperidol.  . Lithium, tricyclic antidepressants, and methadone. Thank You,    Harland German, Pharm D 05/19/2017 10:04 AM

## 2017-05-19 NOTE — Progress Notes (Signed)
Transported pt from cath lab with cath lab team to [phone] without any complications.

## 2017-05-19 NOTE — Procedures (Signed)
Arterial Catheter Insertion Procedure Note NEHAL WERLEY [id number] [birth year]  Procedure: Insertion of Arterial Catheter  Indications: Blood pressure monitoring and Frequent blood sampling  Procedure Details Consent: Unable to obtain consent because of emergent medical necessity. Time Out: Verified patient identification, verified procedure, site/side was marked, verified correct patient position, special equipment/implants available, medications/allergies/relevent history reviewed, required imaging and test results available.  Performed  Maximum sterile technique was used including antiseptics, cap, gloves, gown, hand hygiene, mask and sheet. Skin prep: Chlorhexidine; local anesthetic administered 20 gauge catheter was inserted into left radial artery using the Seldinger technique.  Evaluation Blood flow good; BP tracing good. Complications: No apparent complications.   Lang Snow 05/19/2017

## 2017-05-19 NOTE — H&P (Signed)
PULMONARY / CRITICAL CARE MEDICINE   Name: Tony Reynolds MRN: [id number] DOB: Oct 16, 1970    ADMISSION DATE:  05/19/2017 CONSULTATION DATE:  05/19/2017  REFERRING MD:  Dr. Elesa Massed  CHIEF COMPLAINT:  Cardiac Arrest  HISTORY OF PRESENT ILLNESS:   HPI obtained from medical chart review as patient is intubated and sedated.  47 year old male with past medical history of hypertension, current smoker, and LVH who presented from home after cardiac arrest.  Wife reports no recent complaints of chest pain, SOB, etc.  She was not with him at the time of arrest.    Patient was reportedly at party and became unresponsive.  No bystander CPR started per EMS.   Found to be in ventricular fibrillation, treated with epi, narcan, and defibrillation.  ROSC obtained after estimated 18 minutes.  King airway placed by EMS and versed given.  On arrival to ER, patient started on amiodarone gtt for frequent ectopy.  Cardiology consulted.  There was concern over ST/Twave changes and therefore patient for emergency cardiac catheterization.  Unknown if patient made any purposeful movements therefore ice packs applied in ER.  PCCM to admit.     PAST MEDICAL HISTORY :  He  has a past medical history of Arthritis, Back pain, Eczema, Eczema, Hypertension, and Lumbar disc herniation.  PAST SURGICAL HISTORY: He  has a past surgical history that includes No past surgeries and Bilateral anterior total hip arthroplasty (Bilateral, 12/14/2015).  Allergies  Allergen Reactions  . Tomato     No acidic foods    No current facility-administered medications on file prior to encounter.    Current Outpatient Medications on File Prior to Encounter  Medication Sig  . amLODipine (NORVASC) 10 MG tablet Take 1 tablet (10 mg total) by mouth daily. (Patient not taking: Reported on 11/15/2015)  . aspirin EC 325 MG EC tablet Take 1 tablet (325 mg total) by mouth 2 (two) times daily after a meal.  . HYDROcodone-acetaminophen (NORCO/VICODIN)  5-325 MG tablet Take 1 tablet by mouth every 4 (four) hours as needed for moderate pain (Must last 14 days.Do not take and drive a car or use machinery.).  Marland Kitchen lisinopril (PRINIVIL,ZESTRIL) 40 MG tablet Take 1 tablet (40 mg total) by mouth daily.  . methocarbamol (ROBAXIN) 500 MG tablet Take 1 tablet (500 mg total) by mouth every 6 (six) hours as needed for muscle spasms.  Marland Kitchen omeprazole (PRILOSEC) 20 MG capsule Take 1 capsule (20 mg total) by mouth daily.  Marland Kitchen oxyCODONE-acetaminophen (ROXICET) 5-325 MG tablet Take 1-2 tablets by mouth every 4 (four) hours as needed.  . sucralfate (CARAFATE) 1 g tablet Take 1 tablet (1 g total) by mouth 4 (four) times daily -  with meals and at bedtime.    FAMILY HISTORY:  His has no family status information on file.    SOCIAL HISTORY: He  reports that he has been smoking cigarettes.  He started smoking about 28 years ago. He has been smoking about 0.50 packs per day. he has never used smokeless tobacco. He reports that he drinks alcohol. He reports that he does not use drugs.  REVIEW OF SYSTEMS:   Unable   SUBJECTIVE:   VITAL SIGNS: BP 96/61   Pulse 83   Resp (!) 23   Ht 5\' 10"  (1.778 m)   Wt 150 lb (68 kg)   SpO2 100%   BMI 21.52 kg/m   HEMODYNAMICS:    VENTILATOR SETTINGS: Vent Mode: PRVC FiO2 (%):  [100 %] 100 %  Set Rate:  [18 bmp] 18 bmp Vt Set:  [580 mL] 580 mL PEEP:  [8 cmH20] 8 cmH20 Plateau Pressure:  [18 cmH20] 18 cmH20  INTAKE / OUTPUT: No intake/output data recorded.  PHYSICAL EXAMINATION: General:  Critically ill adult male in NAD on MV HEENT: MM pink/moist, ETT, OGT, pupils 3/ sluggish, +corneals Neuro: sedated, unresponsive CV: rr irr, no m/r/g, TR band to right wrist PULM: even/non-labored on MV, lungs bilaterally clear GI: soft, bs +, foley present Extremities: cool/dry, no edema  Skin: no rashes, cooling pads in place  LABS:  BMET Recent Labs  Lab 05/19/17 0154  NA 139  K 2.8*  CL 109  CO2 13*  BUN 9   CREATININE 1.12  GLUCOSE 180*    Electrolytes Recent Labs  Lab 05/19/17 0154  CALCIUM 7.6*  MG 2.0    CBC Recent Labs  Lab 05/19/17 0154  WBC 5.1  HGB 14.8  HCT 42.8  PLT 230    Coag's No results for input(s): APTT, INR in the last 168 hours.  Sepsis Markers Recent Labs  Lab 05/19/17 0156  LATICACIDVEN 7.86*    ABG No results for input(s): PHART, PCO2ART, PO2ART in the last 168 hours.  Liver Enzymes Recent Labs  Lab 05/19/17 0154  AST 51*  ALT 24  ALKPHOS 66  BILITOT 0.8  ALBUMIN 3.3*    Cardiac Enzymes Recent Labs  Lab 05/19/17 0154  TROPONINI 0.17*    Glucose Recent Labs  Lab 05/19/17 0140  GLUCAP 168*    Imaging Dg Chest Portable 1 View  Result Date: 05/19/2017 CLINICAL DATA:  Acute onset of asystole, status post CPR. EXAM: PORTABLE CHEST 1 VIEW COMPARISON:  Chest radiograph performed 03/01/2017, and CTA of the chest performed 03/02/2017 FINDINGS: The patient's endotracheal tube is seen ending 3 cm above the carina. An enteric tube is noted extending below the diaphragm. Minimal right midlung opacity may reflect atelectasis. No pleural effusion or pneumothorax is seen. The cardiomediastinal silhouette is borderline normal in size. No acute osseous abnormalities are seen. External pacing pads are noted. IMPRESSION: 1. Endotracheal tube seen ending 3 cm above the carina. 2. Minimal right midlung opacity may reflect atelectasis. Electronically Signed   By: Roanna Raider M.D.   On: 05/19/2017 02:06   STUDIES:  TTE 07/2015 >> - Mild LVH with LVEF 60-65%. Grade 1 diastolic dysfunction with normal estimated LV filling pressure. Mildly thickened mitral leaflets with trivial mitral regurgitation. Trivial tricuspid regurgitation, unable to estimate PASP.  LHC 05/19/2017 >>  CULTURES: none  ANTIBIOTICS: none  SIGNIFICANT EVENTS: 05/19/2017 >> cardiac arrest, LHC  LINES/TUBES: PIV x 3 ETT 1/1 >> OGT 1/1>> Foley 1/1 >>  DISCUSSION: 69  yoM w/PMH of LVH and HTN with vfib cardiac arrest from home.  No bystander cpr. Did require sedation, but unknown if movement was purposeful.  Taken for emergency LHC with minimal nonobstructive disease, EF 35%.  Vfib arrest while transferring from cath table to stretcher and additionally on arrival to ICU.    ASSESSMENT / PLAN:  PULMONARY A: Acute respiratory failure in the setting of cardiac arrest Tobacco abuse  P:   Full MV support with PRVC 8 cc/kg ABG now CXR in am VAP protocol  CARDIOVASCULAR A:  Vfib cardiac arrest - ROSC after 18 min if not more w/no bystander CPR Recurrent ventricular ectopy Acute systolic HF Prolonged QT - s/p emergent LHC - no intervention, 40% at LAD, EF 35% P:  ICU tele monitoring  Per Cards Levophed prn for  map > 65, consider CVC if needed Place aline Continue amio gtt per cards Goal K > 4 and mag  > 2 Trend troponin and EKG Trend lactate Caution with QT prolonging meds Holding home norvasc, and lisinpril  RENAL A:   Hypokalemia Lactate acidosis At risk AKI P:   Trend BMP/ mag q 12 hr Trend urinary output and daily weight  Replace electrolytes as indicated  GASTROINTESTINAL A:   NPO P:   PPI for SUP Bowel regimen per PAD protocol coags pending  HEMATOLOGIC A:   No acute issues P:  Heparin sq and SCDs for VTE ppx   INFECTIOUS A:   No acute issues P:   Trend WBC and monitor fever curve  ENDOCRINE A:   No acute issues  P:   CBG q 4 hr Start SSI if glucose > 150  NEUROLOGIC A:  Acute encephalopathy- concern for anoxic injury +/- toxic/metabolic - ETOH  161 P:   RASS goal: -1 PAD protocol with prn fentanyl and versed CT head when stable TTM at 36, defer 33 due to refractory vfib EEG ordered UDS pending  FAMILY  - Updates:  Patient's wife, Carollee Herter [phone]) updated.     - Inter-disciplinary family meet or Palliative Care meeting due by:  05/26/2017  CCT 50 mins  Posey Boyer, AGACNP- [facility] Pulmonary  and Critical Care Medicine [facility] Pager: [phone]  05/19/2017, 3:25 AM

## 2017-05-19 NOTE — Progress Notes (Signed)
eLink Physician-Brief Progress Note Patient Name: Tony Reynolds DOB: [birth year] MRN: [id number]   Date of Service  05/19/2017  HPI/Events of Note  ABG on 70%/PRVC 26/TV 580/P 5 = 7.27/36.2/193/17. Attempted to increase PRVC rate to 35 to correct pH, however, patient developed significant air trapping. Therefore, PRVC rate left at 26.   eICU Interventions  Will order: 1. NaHCO3 100 meq IV now.  2. Repeat ABG at 7:30 AM.     Intervention Category Major Interventions: Acid-Base disturbance - evaluation and management;Respiratory failure - evaluation and management  Shalondra Wunschel Eugene 05/19/2017, 6:12 AM

## 2017-05-19 NOTE — Consult Note (Signed)
Cardiology Consultation:   Patient ID: [name]; [id number]; 1970/11/29   Admit date: 05/19/2017 Date of Consult: 05/19/2017  Primary Care Provider: Benita Stabile, MD Primary Cardiologist: No primary care provider on file.   Patient Profile:   Tony Reynolds is a 47 y.o. male with a hx of LVH who is being seen today for the evaluation of cardiac arrest at the request of Dr Elesa Massed.  History of Present Illness:   Mr. Summer has a history of hypertension and LVH.  He does not have any history of coronary artery disease or angina.  He was apparently at a party tonight when he suddenly lost consciousness.  EMS was called and on their arrival he was found to be in ventricular fibrillation.  He was defibrillated with return of spontaneous circulation.  He required 2 mg of epinephrine and was also given Narcan.  He has a history of alcohol use but according to his wife has no history of illicit drug use.  No other history is obtainable at this time.  Past Medical History:  Diagnosis Date  . Arthritis   . Back pain   . Eczema   . Eczema   . Hypertension   . Lumbar disc herniation     Past Surgical History:  Procedure Laterality Date  . BILATERAL ANTERIOR TOTAL HIP ARTHROPLASTY Bilateral 12/14/2015   Procedure: BILATERAL ANTERIOR TOTAL HIP ARTHROPLASTY;  Surgeon: Kathryne Hitch, MD;  Location: [facility];  Service: Orthopedics;  Laterality: Bilateral;  . NO PAST SURGERIES       Home Medications:  Prior to Admission medications   Medication Sig Start Date End Date Taking? Authorizing Provider  amLODipine (NORVASC) 10 MG tablet Take 1 tablet (10 mg total) by mouth daily. Patient not taking: Reported on 11/15/2015 10/19/15   Antoine Poche, MD  aspirin EC 325 MG EC tablet Take 1 tablet (325 mg total) by mouth 2 (two) times daily after a meal. 12/18/15   Kathryne Hitch, MD  HYDROcodone-acetaminophen (NORCO/VICODIN) 5-325 MG tablet Take 1 tablet by mouth every 4 (four) hours as  needed for moderate pain (Must last 14 days.Do not take and drive a car or use machinery.). 11/14/15   Vickki Hearing, MD  lisinopril (PRINIVIL,ZESTRIL) 40 MG tablet Take 1 tablet (40 mg total) by mouth daily. 10/29/15   Antoine Poche, MD  methocarbamol (ROBAXIN) 500 MG tablet Take 1 tablet (500 mg total) by mouth every 6 (six) hours as needed for muscle spasms. 12/18/15   Kathryne Hitch, MD  omeprazole (PRILOSEC) 20 MG capsule Take 1 capsule (20 mg total) by mouth daily. 03/02/17   Rancour, Jeannett Senior, MD  oxyCODONE-acetaminophen (ROXICET) 5-325 MG tablet Take 1-2 tablets by mouth every 4 (four) hours as needed. 12/18/15   Kathryne Hitch, MD  sucralfate (CARAFATE) 1 g tablet Take 1 tablet (1 g total) by mouth 4 (four) times daily -  with meals and at bedtime. 03/02/17   [name], MD    Inpatient Medications: Scheduled Meds:  Continuous Infusions: . amiodarone 60 mg/hr (05/19/17 0213)   Followed by  . amiodarone    . [MAR Hold] potassium chloride 10 mEq (05/19/17 0205)   PRN Meds:   Allergies:    Allergies  Allergen Reactions  . Tomato     No acidic foods    Social History:   Social History   Socioeconomic History  . Marital status: Married    Spouse name: Not on file  . Number  of children: Not on file  . Years of education: Not on file  . Highest education level: Not on file  Social Needs  . Financial resource strain: Not on file  . Food insecurity - worry: Not on file  . Food insecurity - inability: Not on file  . Transportation needs - medical: Not on file  . Transportation needs - non-medical: Not on file  Occupational History  . Not on file  Tobacco Use  . Smoking status: Current Every Day Smoker    Packs/day: 0.50    Types: Cigarettes    Start date: 04/01/1989  . Smokeless tobacco: Never Used  . Tobacco comment: 10 ciggs per day  Substance and Sexual Activity  . Alcohol use: Yes    Alcohol/week: 0.0 oz    Comment: 14  total  of  12 oz beer weekly  . Drug use: No  . Sexual activity: Not on file  Other Topics Concern  . Not on file  Social History Narrative  . Not on file    Family History:   No family history on file. Unable to obtain - pt comatose  ROS:  Please see the history of present illness.  ROS  Not obtainable - pt unconscious  Physical Exam/Data:   Vitals:   05/19/17 0148 05/19/17 0150 05/19/17 0155 05/19/17 0200  BP: 125/70 116/61 125/70 119/65  Pulse: (!) 104 (!) 43 (!) 114 (!) 38  Resp: (!) 21 17 (!) 22 (!) 21  SpO2:  97% 98% 96%  Weight:      Height: 5\' 10"  (1.778 m)      No intake or output data in the 24 hours ending 05/19/17 0218 Filed Weights   05/19/17 0139  Weight: 150 lb (68 kg)   Body mass index is 21.52 kg/m.  General:  Well nourished, well developed, comatose HEENT: normal, ET tube in place Lymph: no adenopathy Neck: no JVD Endocrine:  No thryomegaly Vascular: No carotid bruits; FA pulses 2+, pedal pulses 2+= Cardiac:  normal S1, S2; RRR; no murmur  Lungs:  clear to auscultation bilaterally, no wheezing, rhonchi or rales  Abd: soft, +BS, no hepatomegaly  Ext: no edema Musculoskeletal:  No deformities Skin: warm and dry  Neuro:  unresponsive Psych:  Unable to evaluate  EKG:  The EKG was personally reviewed and demonstrates:  NSR with frequent PVCs, LVH, marked diffuse ST depression.  Telemetry:  Telemetry was personally reviewed and demonstrates:  Sinus rhythm, frequent PVC's  Relevant CV Studies: 2D echocardiogram August 02, 2015: Study Conclusions  - Left ventricle: The cavity size was normal. Wall thickness was   increased in a pattern of mild LVH. Systolic function was normal.   The estimated ejection fraction was in the range of 60% to 65%.   Wall motion was normal; there were no regional wall motion   abnormalities. Doppler parameters are consistent with abnormal   left ventricular relaxation (grade 1 diastolic dysfunction). - Mitral valve: Mildly  thickened leaflets . There was trivial   regurgitation. - Atrial septum: No defect or patent foramen ovale was identified. - Tricuspid valve: There was trivial regurgitation. - Pericardium, extracardiac: There was no pericardial effusion.  Impressions:  - Mild LVH with LVEF 60-65%. Grade 1 diastolic dysfunction with   normal estimated LV filling pressure. Mildly thickened mitral   leaflets with trivial mitral regurgitation. Trivial tricuspid   regurgitation, unable to estimate PASP.  Laboratory Data:  ChemistryNo results for input(s): NA, K, CL, CO2, GLUCOSE, BUN, CREATININE, CALCIUM,  GFRNONAA, GFRAA, ANIONGAP in the last 168 hours.  No results for input(s): PROT, ALBUMIN, AST, ALT, ALKPHOS, BILITOT in the last 168 hours. HematologyNo results for input(s): WBC, RBC, HGB, HCT, MCV, MCH, MCHC, RDW, PLT in the last 168 hours. Cardiac EnzymesNo results for input(s): TROPONINI in the last 168 hours.  Recent Labs  Lab 05/19/17 0150  TROPIPOC 0.10*    BNPNo results for input(s): BNP, PROBNP in the last 168 hours.  DDimer No results for input(s): DDIMER in the last 168 hours.  Radiology/Studies:  Dg Chest Portable 1 View  Result Date: 05/19/2017 CLINICAL DATA:  Acute onset of asystole, status post CPR. EXAM: PORTABLE CHEST 1 VIEW COMPARISON:  Chest radiograph performed 03/01/2017, and CTA of the chest performed 03/02/2017 FINDINGS: The patient's endotracheal tube is seen ending 3 cm above the carina. An enteric tube is noted extending below the diaphragm. Minimal right midlung opacity may reflect atelectasis. No pleural effusion or pneumothorax is seen. The cardiomediastinal silhouette is borderline normal in size. No acute osseous abnormalities are seen. External pacing pads are noted. IMPRESSION: 1. Endotracheal tube seen ending 3 cm above the carina. 2. Minimal right midlung opacity may reflect atelectasis. Electronically Signed   By: Roanna Raider M.D.   On: 05/19/2017 02:06     Assessment and Plan:   1. Out-of-hospital ventricular fibrillation arrest: The patient has return of spontaneous circulation after EMS arrival and defibrillation.  He is having frequent ventricular ectopy.  There are diffuse ST/T changes but difficult to interpret in the context of his baseline severe LVH.  Considering his young age and primary rhythm abnormality of ventricular fibrillation, I think emergency cardiac catheterization and possible PCI is indicated.  Emergency implied consent is obtained.  I was able to speak with the patient's wife briefly prior to the procedure. Continue IV amiodarone which was started in the ER.  2. Anoxic encephalopathy: hypothermia protocol, CCM to follow. Discussed prognostic issues with patient's wife.  3. Lactic acidosis: post-arrest. Supportive care, resuscitative efforts, NaHCO3.   4. HTN: follow. Maintain adequate MAP per hypothermia protocol. Hold antihypertensives for now.   The patient is critically ill with multiple organ systems failure and requires high complexity decision making for assessment and support, frequent evaluation and titration of therapies, application of advanced monitoring technologies and extensive interpretation of multiple databases.   Critical Care Time devoted to patient care services described in this note is 40 minutes  For questions or updates, please contact [facility] Please consult [email or web address] for contact info under Cardiology/STEMI.   Signed, Tonny Bollman, MD  05/19/2017 2:18 AM

## 2017-05-19 NOTE — Procedures (Signed)
ELECTROENCEPHALOGRAM REPORT  Date of Study: 05/19/17  Patient's Name: Tony Reynolds MRN: [id number] Date of Birth: January 05, 1971  Referring Provider: Jennelle Human, NP  Clinical History: Tony Reynolds is a 47 y.o. M who presented with out of hospital cardiac arrest occurring while at a party last night.  Evidently down about 18 minutes.  Taken to the Cath Lab and had 50% LAD and a 75% diagonal with no intervention being done.  Had recurrent ventricular fibrillation after return from the Cath Lab and again this morning around 830.  He has been having as needed sedation and also has had shivering with hypertension noted at the present time.  Undergoing hypothermia protocol   Medications: Scheduled Meds: . aspirin  81 mg Oral Daily  . chlorhexidine gluconate (MEDLINE KIT)  15 mL Mouth Rinse BID  . Chlorhexidine Gluconate Cloth  6 each Topical Daily  . fentaNYL      . heparin  5,000 Units Subcutaneous Q8H  . insulin aspart  1-3 Units Subcutaneous Q4H  . lidocaine (cardiac) 100 mg/79m      . mouth rinse  15 mL Mouth Rinse 10 times per day  . metoprolol tartrate  2.5 mg Intravenous Q6H  . pantoprazole (PROTONIX) IV  40 mg Intravenous QHS  . sodium bicarbonate      . sodium chloride flush  10-40 mL Intracatheter Q12H  . sodium chloride flush  3 mL Intravenous Q12H   Continuous Infusions: . sodium chloride 250 mL (05/19/17 0918)  . amiodarone 60 mg/hr (05/19/17 1000)  . fentaNYL 100 mcg/hr (05/19/17 1145)  . lidocaine 2 mg/min (05/19/17 0440)  . magnesium sulfate 1 - 4 g bolus IVPB 2 g (05/19/17 1249)  . norepinephrine (LEVOPHED) Adult infusion 2 mcg/min (05/19/17 1305)  . potassium chloride 10 mEq (05/19/17 1248)   PRN Meds:.sodium chloride, acetaminophen, fentaNYL (SUBLIMAZE) injection, hydrALAZINE, midazolam, sodium chloride flush, sodium chloride flush            Technical Summary: This is a standard 16 channel EEG recording performed according to the international 10-20  electrode system.  AP bipolar, transverse bipolar, and referential montages were obtained, and digitally reformatted as necessary.  Duration of tracing: 21:00  Description: Pt noted to be intubated (no sedation) w/ hypothermia protocol (T 35.8C).  Background voltage is severely suppressed, with intermittent EKG, electrical, and muscle artifiact.  At 3uV sensitivity (HFF 30 Hz) there appears to be a 3-4 Hz posterior rhythym that persists throughout the tracing.  Stimulation in the second half of the tracing (pain to both hands) seems to cause the pt to grimace, pull back, with increased background muscle.  No well defined awake/sleep states were noted.  Neither HV or photic stimulation were performed.  EKG was monitored and noted to be SR at 78 bpm, with occasional PVC's.  No epileptiform changes were noted.  Impression: This is an abnormal EEG due to severe, diffuse voltage suppression that persists throughout the tracing.  At higher sensitivities there appears to be 3-4 Hz slowing.  Additionally, there appeared to be some evidence of EEG reactivity to painful stimulation.  No epileptiform changes were noted.  Although EEG reactivity might be a favorable sign, these findings are generally non-specific and can be seen with toxic, metabolic, and diffuse or multifocal structural processes.  Given the clinical presentation with possibility of anoxic encephalopathy, a repeat EEG once the patient is rewarmed might provide additional prognostic information.  Clinical and radiographic correlation advised.    RCarvel Getting M.D. Neurology Cell (  [phone] [id number]

## 2017-05-19 NOTE — Progress Notes (Signed)
eLink Physician-Brief Progress Note Patient Name: Tony Reynolds DOB: 1970-08-29 MRN: [id number]   Date of Service  05/19/2017  HPI/Events of Note  Severe Agitation - minimal response to Fentanyl IV.   eICU Interventions  Will order: 1. Versed 2 mg IV Q 1 hour PRN agitation.      Intervention Category Major Interventions: Delirium, psychosis, severe agitation - evaluation and management  Sommer,Steven Eugene 05/19/2017, 6:36 AM

## 2017-05-19 NOTE — ED Notes (Signed)
To Cath lab 

## 2017-05-19 NOTE — Code Documentation (Signed)
  Patient Name: Tony Reynolds   MRN: [id number]   Date of Birth/ Sex: Feb 28, 1971 , male      Admission Date: 05/19/2017  Attending Provider: Carin Hock, MD  Primary Diagnosis: <principal problem not specified>   Indication: Pt was in his usual state of health until this AM, when he was noted to be in Vfib. Code blue was subsequently called. At the time of arrival on scene, ACLS protocol was underway.   Technical Description:  - CPR performance duration:  6 minutes  - Was defibrillation or cardioversion used? Yes   - Was external pacer placed? No  - Was patient intubated pre/post CPR? Pt already intubated   Medications Administered: Y = Yes; Blank = No Amiodarone    Atropine    Calcium    Epinephrine    Lidocaine    Magnesium    Norepinephrine    Phenylephrine    Sodium bicarbonate  1  Vasopressin     Post CPR evaluation:  - Final Status - Was patient successfully resuscitated ? Yes - What is current rhythm? sinus - What is current hemodynamic status? stable  Miscellaneous Information:  - Labs sent, including: Chem 8, ptt, INR  - Primary team notified?  Yes  - Family Notified? Yes  - Additional notes/ transfer status: Pt already in ICU under PCCM care     Angelita Ingles, MD  05/19/2017, 9:28 AM

## 2017-05-19 NOTE — ED Provider Notes (Addendum)
TIME SEEN: 2:16 AM  CHIEF COMPLAINT: Cardiac arrest  HPI: Patient is a 47 year old male with history of hypertension, grade 1 diastolic dysfunction (echo 07/2015) who presents to the emergency department via [facility] after a cardiac arrest.  Reportedly patient was drinking tonight and then friends witnessed him collapse just after midnight.  Unknown if there is any drug use.  EMS states that no bystander CPR was started but they arrived within 5 minutes of 911 being called.  Patient was in ventricular fibrillation upon their arrival.  They defibrillated him once and then he went into asystole.  Approximately 10 minutes of CPR were performed.  Patient was given 2 rounds of epinephrine and 2 mg of Narcan.  They had return of spontaneous circulation at 12:58 AM.  Brooke Dare airway was placed by EMS.  Blood glucose was 202.  Initial EKG concerning for anterior MI.  Last blood pressure 129/85.  Patient given Versed by EMS prior to arrival as well.  ROS: Level 5 caveat secondary to condition  PAST MEDICAL HISTORY/PAST SURGICAL HISTORY:  Past Medical History:  Diagnosis Date  . Arthritis   . Back pain   . Eczema   . Eczema   . Hypertension   . Lumbar disc herniation     MEDICATIONS:  Prior to Admission medications   Medication Sig Start Date End Date Taking? Authorizing Provider  amLODipine (NORVASC) 10 MG tablet Take 1 tablet (10 mg total) by mouth daily. Patient not taking: Reported on 11/15/2015 10/19/15   Antoine Poche, MD  aspirin EC 325 MG EC tablet Take 1 tablet (325 mg total) by mouth 2 (two) times daily after a meal. 12/18/15   Kathryne Hitch, MD  HYDROcodone-acetaminophen (NORCO/VICODIN) 5-325 MG tablet Take 1 tablet by mouth every 4 (four) hours as needed for moderate pain (Must last 14 days.Do not take and drive a car or use machinery.). 11/14/15   Vickki Hearing, MD  lisinopril (PRINIVIL,ZESTRIL) 40 MG tablet Take 1 tablet (40 mg total) by mouth daily. 10/29/15   Antoine Poche, MD  methocarbamol (ROBAXIN) 500 MG tablet Take 1 tablet (500 mg total) by mouth every 6 (six) hours as needed for muscle spasms. 12/18/15   Kathryne Hitch, MD  omeprazole (PRILOSEC) 20 MG capsule Take 1 capsule (20 mg total) by mouth daily. 03/02/17   Rancour, Jeannett Senior, MD  oxyCODONE-acetaminophen (ROXICET) 5-325 MG tablet Take 1-2 tablets by mouth every 4 (four) hours as needed. 12/18/15   Kathryne Hitch, MD  sucralfate (CARAFATE) 1 g tablet Take 1 tablet (1 g total) by mouth 4 (four) times daily -  with meals and at bedtime. 03/02/17   [name], MD    ALLERGIES:  Allergies  Allergen Reactions  . Tomato     No acidic foods    SOCIAL HISTORY:  Social History   Tobacco Use  . Smoking status: Current Every Day Smoker    Packs/day: 0.50    Types: Cigarettes    Start date: 04/01/1989  . Smokeless tobacco: Never Used  . Tobacco comment: 10 ciggs per day  Substance Use Topics  . Alcohol use: Yes    Alcohol/week: 0.0 oz    Comment: 14  total  of 12 oz beer weekly    FAMILY HISTORY: No family history on file.  EXAM: BP 119/65   Pulse (!) 38   Resp (!) 21   Ht 5\' 10"  (1.778 m)   Wt 68 kg (150 lb)   SpO2 96%  BMI 21.52 kg/m  CONSTITUTIONAL: Patient is unresponsive, GCS 3, he is breathing on his own HEAD: Normocephalic EYES: Conjunctivae clear, pupils appear equal and approximately 4 mm bilaterally and sluggishly reactive ENT: normal nose; moist mucous membranes NECK: Supple, no meningismus, no nuchal rigidity, no LAD  CARD: RRR; S1 and S2 appreciated; no murmurs, no clicks, no rubs, no gallops RESP: Patient does have some agonal respirations and breathing on his own.  He has a Scientist, clinical (histocompatibility and immunogenetics) airway in place.  No rhonchorous breath sounds.  No wheezing or rales. ABD/GI: Normal bowel sounds; non-distended; soft,  no hepatosplenomegaly BACK:  The back appears normal EXT: Normal ROM in all joints; tremors are cool to touch, no cyanosis, no calf swelling,  2+ femoral, 2+ radial, 2+ DP pulses bilaterally SKIN: Normal color for age and race NEURO: GCS 3   MEDICAL DECISION MAKING: Patient here after cardiac arrest.  Return of spontaneous circulation at 12:58 AM with EMS. Currently in a sinus rhythm.  Equal pulses in all 4 extremities.  Initial EKG with EMS concerning for anterior STEMI.  Code STEMI initiated.  ST elevation seems to have improved on new EKG but he does have a lot of ectopy.  Cardiology fellow Dr. Tiburcio Pea at bedside upon patient's arrival.  Patient had a [facility] airway in place.  Given he was breathing on his own he was given etomidate and succinylcholine for intubation and endotracheal tube was placed.  Chest x-ray confirms placement.  No edema seen.  No pneumothorax.  Dr. Excell Seltzer with cardiology recommends taking patient to Cath Lab.  We have initiated cooling protocol.  Dr. Arsenio Loader with critical care also aware of patient and they will see him once he comes out of the Cath Lab.  Patient's repeat EKG here shows a lot of ectopy.  We are giving him amiodarone bolus and drip.  Potassium is low at 2.8.  Given IV potassium.  Will check magnesium level as well.  Appreciate cardiology and critical care help.  I reviewed all nursing notes, vitals, pertinent previous records, EKGs, lab and urine results, imaging (as available).   Procedure Name: Intubation Date/Time: 05/19/2017 3:50 AM Performed by: Havah Ammon, Layla Maw, DO Pre-anesthesia Checklist: Patient identified, Patient being monitored, Emergency Drugs available, Timeout performed and Suction available Oxygen Delivery Method: Ambu bag Preoxygenation: Pre-oxygenation with 100% oxygen Induction Type: Rapid sequence Laryngoscope Size: Glidescope and 3 Grade View: Grade II Tube size: 7.5 mm Number of attempts: 1 Placement Confirmation: ETT inserted through vocal cords under direct vision,  CO2 detector and Breath sounds checked- equal and bilateral              CRITICAL CARE Performed  by: Rochele Raring   Total critical care time: 35 minutes  Critical care time was exclusive of separately billable procedures and treating other patients.  Critical care was necessary to treat or prevent imminent or life-threatening deterioration.  Critical care was time spent personally by me on the following activities: development of treatment plan with patient and/or surrogate as well as nursing, discussions with consultants, evaluation of patient's response to treatment, examination of patient, obtaining history from patient or surrogate, ordering and performing treatments and interventions, ordering and review of laboratory studies, ordering and review of radiographic studies, pulse oximetry and re-evaluation of patient's condition.     EKG Interpretation  Date/Time:  Tuesday May 19 2017 01:42:33 EST Ventricular Rate:  105 PR Interval:    QRS Duration: 102 QT Interval:  463 QTC Calculation: 612 R Axis:   75  Text Interpretation:  Sinus tachycardia Ventricular trigeminy LAE, consider biatrial enlargement LVH with secondary repolarization abnormality ST depression, consider ischemia, diffuse lds Prolonged QT interval Premature ventricular complexes Confirmed by Rochele Raring [phone]) on 05/19/2017 3:49:26 AM         Telina Kleckley, Layla Maw, DO 05/19/17 0352    Hani Patnode, Layla Maw, DO 05/19/17 2703

## 2017-05-19 NOTE — Progress Notes (Signed)
Subjective:  History reviewed and patient examined.  Unable to get any sort of history.  Presented with out of hospital cardiac arrest occurring while at a party last night.  Evidently down about 18 minutes.  Taken to the Cath Lab and had 50% LAD and a 75% diagonal with no intervention being done.  Had recurrent ventricular fibrillation after return from the Cath Lab and again this morning around 830.  He has been having as needed sedation and also has had shivering with hypertension noted at the present time.  Objective:  Vital Signs in the last 24 hours: BP (!) 162/107   Pulse 84   Temp (!) 97.3 F (36.3 C) (Core (Comment))   Resp (!) 24   Ht 5\' 10"  (1.778 m)   Wt 68 kg (150 lb)   SpO2 95%   BMI 21.52 kg/m   Physical Exam: Intubated black male currently shivering and posturing somewhat.   Lungs:  Clear anteriorly, reduced breath sounds at the bases Cardiac:  Regular rhythm, normal S1 and S2, no S3 Abdomen:  Soft, nontender, no masses Extremities:  No edema present, radial cath site is currently clean and dry Neurologic: Unable to assess as as needed sedated but does not arouse to voice  Intake/Output from previous day: 12/31 0701 - 01/01 0700 In: 1627.4 [I.V.:757.4; IV Piggyback:870] Out: 1 [Stool:1]  Weight Filed Weights   05/19/17 0139  Weight: 68 kg (150 lb)    Lab Results: Basic Metabolic Panel: Recent Labs    05/19/17 0154  05/19/17 0435 05/19/17 0840  NA 139  --  144 148*  K 2.8*  --  3.5 3.9  CL 109  --  110 109  CO2 13*  --   --   --   GLUCOSE 180*  --  211* 161*  BUN 9  --  12 14  CREATININE 1.12   < > 1.00 0.90   < > = values in this interval not displayed.   CBC: Recent Labs    05/19/17 0154 05/19/17 0430 05/19/17 0435 05/19/17 0840  WBC 5.1 12.3*  --   --   NEUTROABS 1.6*  --   --   --   HGB 14.8 16.0 16.7 16.3  HCT 42.8 45.4 49.0 48.0  MCV 87.5 87.8  --   --   PLT 230 256  --   --    Cardiac Enzymes: Troponin (Point of Care  Test) Recent Labs    05/19/17 0150  TROPIPOC 0.10*   Cardiac Panel (last 3 results) Recent Labs    05/19/17 0154 05/19/17 0430 05/19/17 0650  TROPONINI 0.17* 2.32* 12.38*    Telemetry: Currently sinus with PACs  Assessment/Plan:  1.  Out of hospital cardiac arrest with recurrent ventricular fibrillation currently on amiodarone and lidocaine-etiology of event unclear whether was toxic due to alcohol or could have been ischemic due to either spasm or spontaneous reperfusion of the diagonal branch that was not intervened on 2.  Hypertensive heart disease with uncontrolled hypertension now 3.  Anoxic encephalopathy 4.  Non-STEMI  Recommendations:  Discussed with critical care, currently is hypertensive.  Have recommended sedation continued cooling and continue amiodarone.  We will rebolus with amiodarone and increase drip back to 60 mg for another 6 hours.  Recheck magnesium and replete if needed.  Continue to cycle troponins and recheck EKG  Critical care time spent 40 minutes.     Darden Palmer  MD [facility]  05/19/2017, 9:42 AM

## 2017-05-19 NOTE — Progress Notes (Signed)
Pt experience VF arrest responsive to shock twice within . NO CPR required. Verbal orders from Dr. Kendrick Fries to give amio bolus 150mg  once.

## 2017-05-19 NOTE — Progress Notes (Signed)
Lopressor given. BP dropped to 60's. Levophed started and titrated to 20 over a 15 min period rapidly to maintain BP. During this time pt went into VF frequently. Defibrillated x4, with immediate return of SR each time.

## 2017-05-19 NOTE — Progress Notes (Signed)
eLink Physician-Brief Progress Note Patient Name: Tony Reynolds DOB: [birth year] MRN: [id number]   Date of Service  05/19/2017  HPI/Events of Note  Severe agitation - Patient woke up and started to stack his breaths.   eICU Interventions  Will order: 1. Fentanyl 25-100 mcg IV Q 1 hour PRN agitation.      Intervention Category Major Interventions: Delirium, psychosis, severe agitation - evaluation and management  Sommer,Steven Eugene 05/19/2017, 6:26 AM

## 2017-05-19 NOTE — Progress Notes (Signed)
RT responded to code blue, bag ventilated the patient until pulses recovered.

## 2017-05-19 NOTE — Progress Notes (Signed)
EEG Completed; Results Pending  

## 2017-05-19 NOTE — ED Triage Notes (Signed)
Patient was at a residence celebrating the new year, had a sudden collapse after midnight around 52.  Patient was given 2 epi and 2 narcan en route to ED.  Patient was in Vfib and shocked once, patient went into asystole, CPR started and ROSC obtained at 0058.  Upon arrival to ED, patient was given a total of 5mg  Versed by EMS.  Patient is intubated with [facility] airway by EMS.

## 2017-05-19 NOTE — Progress Notes (Addendum)
Patient arrived to 2H from cath lab with a lot of ectopy -- vent trigeminy. This RN discussed with CCM at bedside regarding cooling of patient d/t witnessed vifib arrest. No head CT was done prior to patient coming to 2H.  Patient seemingly went straight from truck to cath lab.  Patient was shocked at 0256 for vfib. CCM gave orders for Mag 2g, calcium gluconate, to cool to 36, and to then go for a head CT once the patient was more stable.    While turning patient to clean up of incontinent stool, patient went into vfib again. Patient was shocked at [phone] with return to SR with PVCs.   0630 patient was stimulated by EEG tech for the purpose of looking for brain function.  Patient's ETT came out some. Respiratory readvanced ETT.  Patient given sedation to assist with helping his agitation.

## 2017-05-19 NOTE — Progress Notes (Signed)
Dr. Kendrick Fries notified for K 3.0 and Mg 1.8. MD also made aware of pt's HTN, sBP 170-160s, after pain and anxiety were treated with PRN orders. MD to place orders.

## 2017-05-19 NOTE — ED Notes (Signed)
Wife is here in waiting room; paged chaplain

## 2017-05-19 NOTE — Procedures (Signed)
Central Venous Catheter Insertion Procedure Note Tony Reynolds [id number] March 17, 1971  Procedure: Insertion of Central Venous Catheter Indications: Assessment of intravascular volume and Drug and/or fluid administration  Procedure Details Consent: Risks of procedure as well as the alternatives and risks of each were explained to the (patient/caregiver).  Consent for procedure obtained. Time Out: Verified patient identification, verified procedure, site/side was marked, verified correct patient position, special equipment/implants available, medications/allergies/relevent history reviewed, required imaging and test results available.  Performed  Maximum sterile technique was used including antiseptics, cap, gloves, gown, hand hygiene, mask and sheet. Skin prep: Chlorhexidine; local anesthetic administered A antimicrobial bonded/coated triple lumen catheter was placed in the left internal jugular vein using the Seldinger technique.  Ultrasound was used to verify the patency of the vein and for real time needle guidance.  Evaluation Blood flow good Complications: No apparent complications Patient did tolerate procedure well. Chest X-ray ordered to verify placement.  CXR: pending.  Max Fickle 05/19/2017, 10:49 AM

## 2017-05-19 NOTE — Progress Notes (Signed)
Pt continues to shiver despite sedation and counter-warming interventions. MD made aware. Waiting for orders to be placed.

## 2017-05-19 NOTE — Progress Notes (Signed)
CDS referral made by this RN.  Referral # is [phone]

## 2017-05-19 NOTE — Progress Notes (Signed)
[facility] PCCM Attending:  S: admitted over night for V fib arrest, went to cath lab, no lesion identified which would be amenable to intervention; after coming back from the cath lab he had another Vfib arrest.  O: Vitals:   05/19/17 0837 05/19/17 0845 05/19/17 0900 05/19/17 0915  BP: (!) 138/92 117/89 (!) 131/116 (!) 162/107  Pulse: 85 76 78 84  Resp: (!) 28 19 (!) 22 (!) 24  Temp:   (!) 97.3 F (36.3 C)   TempSrc:   Core (Comment)   SpO2: 93% 99% 98% 95%  Weight:      Height:       Vent Mode: PRVC FiO2 (%):  [70 %-100 %] 70 % Set Rate:  [18 bmp-26 bmp] 26 bmp Vt Set:  [580 mL] 580 mL PEEP:  [5 cmH20-8 cmH20] 5 cmH20 Plateau Pressure:  [18 cmH20-22 cmH20] 19 cmH20  General:  In bed on vent HENT: NCAT ETT in place PULM: CTA B, vent supported breathing CV: RRR, no mgr GI: BS+, soft, nontender MSK: normal bulk and tone Neuro: sedated on vent  CBC    Component Value Date/Time   WBC 12.3 (H) 05/19/2017 0430   RBC 5.17 05/19/2017 0430   HGB 16.3 05/19/2017 0840   HCT 48.0 05/19/2017 0840   PLT 256 05/19/2017 0430   MCV 87.8 05/19/2017 0430   MCH 30.9 05/19/2017 0430   MCHC 35.2 05/19/2017 0430   RDW 14.1 05/19/2017 0430   LYMPHSABS 3.4 05/19/2017 0154   MONOABS 0.0 (L) 05/19/2017 0154   EOSABS 0.1 05/19/2017 0154   BASOSABS 0.0 05/19/2017 0154   BMET    Component Value Date/Time   NA 148 (H) 05/19/2017 0840   K 3.9 05/19/2017 0840   CL 109 05/19/2017 0840   CO2 13 (L) 05/19/2017 0154   GLUCOSE 161 (H) 05/19/2017 0840   BUN 14 05/19/2017 0840   CREATININE 0.90 05/19/2017 0840   CREATININE 0.92 11/13/2015 0816   CALCIUM 7.6 (L) 05/19/2017 0154   GFRNONAA >60 05/19/2017 0430   GFRAA >60 05/19/2017 0430   LHC images reviewed with cardiology: 70% obstruction of a diagonal off the LAD  Impression/Plan:  Acute respiratory failure with hypoxemia: continue full vent support, VAP prevention,  Vfib arrest in setting of NSTEMI, CAD, cardiomyopathy: continue amiodarone  and lidocaine, tele, follow bmet, Mg closely throughout the day, continue to keep PADs in place; f/u echo, check coox, cvp after CVL placement CAD with NSTEMI: not amenable to intervention per cardiology, management per cardilogy Diarrhea: flexiseal Acute encephalopathy: at risk for anoxic brain injury, continue normothermia protocol  My cc time 30 minutes this morning  Heber Sardinia, MD [place] PCCM Pager: [phone] Cell: [phone] After 3pm or if no response, call [phone]

## 2017-05-19 NOTE — Progress Notes (Signed)
[facility] PCCM  Significant shivering Start nimbex protocol, add versed for sedation

## 2017-05-19 NOTE — Progress Notes (Signed)
PT experience VF arrest. CPR performed, with one shock delivered. ROSC achieved after shock. MD's made aware. Verbal orders from Dr. Donnie Aho for give Amio bolus 150mg  once and increase the gtt to 60mg /hr.

## 2017-05-20 ENCOUNTER — Encounter (HOSPITAL_COMMUNITY): Payer: Self-pay | Admitting: Cardiovascular Disease

## 2017-05-20 ENCOUNTER — Inpatient Hospital Stay (HOSPITAL_COMMUNITY): Payer: BLUE CROSS/BLUE SHIELD

## 2017-05-20 DIAGNOSIS — I340 Nonrheumatic mitral (valve) insufficiency: Secondary | ICD-10-CM

## 2017-05-20 LAB — GLUCOSE, CAPILLARY
GLUCOSE-CAPILLARY: 96 mg/dL (ref 65–99)
GLUCOSE-CAPILLARY: 89 mg/dL (ref 65–99)
Glucose-Capillary: 102 mg/dL — ABNORMAL HIGH (ref 65–99)
Glucose-Capillary: 99 mg/dL (ref 65–99)

## 2017-05-20 LAB — RENAL FUNCTION PANEL
ANION GAP: 8 (ref 5–15)
Albumin: 3 g/dL — ABNORMAL LOW (ref 3.5–5.0)
BUN: 13 mg/dL (ref 6–20)
CALCIUM: 7.6 mg/dL — AB (ref 8.9–10.3)
CO2: 19 mmol/L — AB (ref 22–32)
Chloride: 109 mmol/L (ref 101–111)
Creatinine, Ser: 0.79 mg/dL (ref 0.61–1.24)
GFR calc Af Amer: >60 mL/min (ref >60)
GFR calc non Af Amer: >60 mL/min (ref >60)
GLUCOSE: 112 mg/dL — AB (ref 65–99)
Phosphorus: 2.1 mg/dL — ABNORMAL LOW (ref 2.5–4.6)
Potassium: 4.6 mmol/L (ref 3.5–5.1)
SODIUM: 136 mmol/L (ref 135–145)

## 2017-05-20 LAB — POCT I-STAT 3, ART BLOOD GAS (G3+)
ACID-BASE DEFICIT: 1 mmol/L (ref 0.0–2.0)
Acid-base deficit: 14 mmol/L — ABNORMAL HIGH (ref 0.0–2.0)
BICARBONATE: 15.5 mmol/L — AB (ref 20.0–28.0)
BICARBONATE: 21.1 mmol/L (ref 20.0–28.0)
O2 SAT: 100 %
O2 Saturation: 99 %
PCO2 ART: 49.6 mmHg — AB (ref 32.0–48.0)
PO2 ART: 155 mmHg — AB (ref 83.0–108.0)
Patient temperature: 36.6
TCO2: 17 mmol/L — AB (ref 22–32)
TCO2: 22 mmol/L (ref 22–32)
pCO2 arterial: 27.4 mmHg — ABNORMAL LOW (ref 32.0–48.0)
pH, Arterial: 7.103 — CL (ref 7.350–7.450)
pH, Arterial: 7.494 — ABNORMAL HIGH (ref 7.350–7.450)
pO2, Arterial: 221 mmHg — ABNORMAL HIGH (ref 83.0–108.0)

## 2017-05-20 LAB — ECHOCARDIOGRAM COMPLETE
Height: 70 in
WEIGHTICAEL: 2673.74 [oz_av]

## 2017-05-20 LAB — POCT I-STAT, CHEM 8
BUN: 10 mg/dL (ref 6–20)
CHLORIDE: 110 mmol/L (ref 101–111)
Calcium, Ion: 1.07 mmol/L — ABNORMAL LOW (ref 1.15–1.40)
Creatinine, Ser: 1.1 mg/dL (ref 0.61–1.24)
Glucose, Bld: 128 mg/dL — ABNORMAL HIGH (ref 65–99)
HCT: 48 % (ref 39.0–52.0)
Hemoglobin: 16.3 g/dL (ref 13.0–17.0)
POTASSIUM: 3.5 mmol/L (ref 3.5–5.1)
Sodium: 145 mmol/L (ref 135–145)
TCO2: 17 mmol/L — ABNORMAL LOW (ref 22–32)

## 2017-05-20 LAB — CBC
HCT: 42.3 % (ref 39.0–52.0)
HEMOGLOBIN: 14.8 g/dL (ref 13.0–17.0)
MCH: 30.1 pg (ref 26.0–34.0)
MCHC: 35 g/dL (ref 30.0–36.0)
MCV: 86.2 fL (ref 78.0–100.0)
Platelets: 152 10*3/uL (ref 150–400)
RBC: 4.91 MIL/uL (ref 4.22–5.81)
RDW: 14.3 % (ref 11.5–15.5)
WBC: 9.9 10*3/uL (ref 4.0–10.5)

## 2017-05-20 LAB — HIV ANTIBODY (ROUTINE TESTING W REFLEX): HIV Screen 4th Generation wRfx: NONREACTIVE

## 2017-05-20 LAB — MAGNESIUM
Magnesium: 2.1 mg/dL (ref 1.7–2.4)
Magnesium: 2.1 mg/dL (ref 1.7–2.4)

## 2017-05-20 MED ORDER — CLONIDINE HCL 0.1 MG/24HR TD PTWK
0.1000 mg | MEDICATED_PATCH | TRANSDERMAL | Status: DC
  Administered 2017-05-20 – 2017-05-27 (×2): 0.1 mg via TRANSDERMAL
  Filled 2017-05-20 (×2): qty 1

## 2017-05-20 MED ORDER — MIDAZOLAM HCL 2 MG/2ML IJ SOLN
2.0000 mg | INTRAMUSCULAR | Status: DC | PRN
  Administered 2017-05-20 – 2017-05-22 (×11): 2 mg via INTRAVENOUS
  Filled 2017-05-20 (×9): qty 2

## 2017-05-20 MED ORDER — METOPROLOL TARTRATE 5 MG/5ML IV SOLN
5.0000 mg | Freq: Three times a day (TID) | INTRAVENOUS | Status: DC
  Administered 2017-05-20 (×2): 5 mg via INTRAVENOUS
  Filled 2017-05-20 (×2): qty 5

## 2017-05-20 MED ORDER — MIDAZOLAM HCL 2 MG/2ML IJ SOLN
2.0000 mg | INTRAMUSCULAR | Status: AC | PRN
  Administered 2017-05-20 – 2017-05-21 (×3): 2 mg via INTRAVENOUS
  Filled 2017-05-20 (×5): qty 2

## 2017-05-20 MED ORDER — METOPROLOL TARTRATE 5 MG/5ML IV SOLN: 5.0000 mg | Freq: Four times a day (QID) | INTRAVENOUS | Status: DC

## 2017-05-20 NOTE — Progress Notes (Addendum)
PCCM INTERVAL PROGRESS NOTE  47 year old male VF cardiac arrest  Rewarmed today (was on normothermia TTM)  With drugs off he is beginning to wake up and follow commands intermittently. Slowly improving.   Remains hypertensive off sedation. Will increase metoprolol to 5mg  q 8 hours and add a clonidine patch low dose.   Wife at bedside and updated.   Joneen Roach, AGACNP-BC [facility] Pulmonology/Critical Care Pager [phone] or [phone]  05/20/2017 2:18 PM

## 2017-05-20 NOTE — Progress Notes (Signed)
PULMONARY / CRITICAL CARE MEDICINE   Name: Tony Reynolds MRN: [id number] DOB: 1971-02-08    ADMISSION DATE:  05/19/2017 CONSULTATION DATE:  05/19/2017  REFERRING MD:  Dr. Elesa Massed  CHIEF COMPLAINT:  Cardiac Arrest  HISTORY OF PRESENT ILLNESS:   47 year old male with past medical history of hypertension, current smoker, and LVH who presented from home after cardiac arrest. Patient was reportedly at party and became unresponsive.  No bystander CPR started per EMS.   Found to be in ventricular fibrillation, treated with epi, narcan, and defibrillation.  ROSC obtained after estimated 18 minutes.  On arrival to ER, patient started on amiodarone gtt for frequent ectopy.  There was concern over ST/Twave changes and therefore patient for emergency cardiac catheterization.  Unknown if patient made any purposeful movements therefore ice packs applied in ER.  PCCM to admit.     SUBJECTIVE:  Shivering on normothermia protocol. Deeply sedated and low dose nimbex started yesterday. No lesion identified in cath lab.   VITAL SIGNS: BP (!) 141/109 (BP Location: Right Arm)   Pulse (!) 53   Temp (!) 96.3 F (35.7 C) (Core)   Resp (!) 26   Ht 5\' 10"  (1.778 m)   Wt 75.8 kg (167 lb 1.7 oz)   SpO2 100%   BMI 23.98 kg/m   HEMODYNAMICS: CVP:  [3 mmHg-13 mmHg] 8 mmHg  VENTILATOR SETTINGS: Vent Mode: PRVC FiO2 (%):  [50 %-60 %] 50 % Set Rate:  [26 bmp] 26 bmp Vt Set:  [580 mL] 580 mL PEEP:  [5 cmH20] 5 cmH20 Plateau Pressure:  [20 cmH20-28 cmH20] 21 cmH20  INTAKE / OUTPUT: I/O last 3 completed shifts: In: 4657.7 [I.V.:3417.7; Other:120; IV Piggyback:1120] Out: 2716 [Urine:1065; Emesis/NG output:550; Stool:1101]  PHYSICAL EXAMINATION:  General:  Critically ill adult male in NAD HEENT: MM pink/moist, ETT, OGT, pupils 3 minimally responsive Neuro: sedated, unresponsive CV: RRR, no MRG PULM: even/non-labored on MV, lungs bilaterally clear GI: Soft, non-distended. Hypoactive Extremities: no acute  deformity. No edema Skin: Grossly intact, no rash.   LABS:  BMET Recent Labs  Lab 05/19/17 1047 05/19/17 1552 05/20/17 0318  NA 144 141 136  K 3.0* 5.0 4.6  CL 114* 113* 109  CO2 22 19* 19*  BUN 15 15 13   CREATININE 0.93 1.00 0.79  GLUCOSE 163* 103* 112*    Electrolytes Recent Labs  Lab 05/19/17 0650 05/19/17 1047 05/19/17 1552 05/19/17 2314 05/20/17 0318  CALCIUM  --  7.9* 7.6*  --  7.6*  MG  --  1.8 2.8* 2.3 2.1  PHOS 3.5  --   --   --  2.1*    CBC Recent Labs  Lab 05/19/17 0154 05/19/17 0430 05/19/17 0435 05/19/17 0840 05/20/17 0318  WBC 5.1 12.3*  --   --  9.9  HGB 14.8 16.0 16.7 16.3 14.8  HCT 42.8 45.4 49.0 48.0 42.3  PLT 230 256  --   --  152    Coag's Recent Labs  Lab 05/19/17 0430 05/19/17 1224  APTT 91* 32  INR 1.03 0.98    Sepsis Markers Recent Labs  Lab 05/19/17 0156 05/19/17 0650  LATICACIDVEN 7.86* 5.0*    ABG Recent Labs  Lab 05/19/17 0528 05/19/17 0817 05/20/17 0506  PHART 7.271* 7.242* 7.494*  PCO2ART 36.2 39.9 27.4*  PO2ART 193.0* 156.0* 221.0*    Liver Enzymes Recent Labs  Lab 05/19/17 0154 05/20/17 0318  AST 51*  --   ALT 24  --   ALKPHOS 66  --  BILITOT 0.8  --   ALBUMIN 3.3* 3.0*    Cardiac Enzymes Recent Labs  Lab 05/19/17 0430 05/19/17 0650 05/19/17 1552  TROPONINI 2.32* 12.38* 25.35*    Glucose Recent Labs  Lab 05/19/17 0817 05/19/17 1236 05/19/17 1605 05/19/17 2014 05/19/17 2338 05/20/17 0819  GLUCAP 140* 135* 102* 126* 109* 102*    Imaging Dg Chest Port 1 View  Result Date: 05/20/2017 CLINICAL DATA:  47 year old male with endotracheal tube in place. Subsequent encounter. EXAM: PORTABLE CHEST 1 VIEW COMPARISON:  05/19/2017. FINDINGS: Endotracheal tube tip 4.7 cm above the carina. Left central line tip proximal superior vena cava level. Tip less laterally directed than on prior exam. Subsegmental atelectasis mid left lung and bilateral lower lobe. Slight decrease in degree of  central pulmonary vascular prominence. Heart size top-normal. No gross pneumothorax. IMPRESSION: Slight change in position of left central line with tip proximal superior vena cava level. Slight decrease in degree of central pulmonary vascular prominence. Subsegmental atelectasis left mid lung and bibasilar region unchanged. Electronically Signed   By: Lacy Duverney M.D.   On: 05/20/2017 07:35   Dg Chest Port 1 View  Result Date: 05/19/2017 CLINICAL DATA:  Central line placement EXAM: PORTABLE CHEST 1 VIEW COMPARISON:  Earlier the same day FINDINGS: 1037 hours. New left IJ central line identified with the tip overlying the innominate vein confluence. The tip of the catheter is directed laterally and may be against the wall of the venous anatomy. A tracheal tube tip is 3.5 cm above the base of the carina. The NG tube passes into the stomach although the distal tip position is not included on the film. Similar appearance left parahilar opacity. The cardio pericardial silhouette is enlarged. Low volume film without pneumothorax. The visualized bony structures of the thorax are intact. Telemetry leads overlie the chest. IMPRESSION: Left IJ central line tip is positioned in the region of the innominate vein confluence with the tip directed laterally, potentially generating pressure on the wall of the vascular anatomy at this location. If this is a clinical concern, catheter could be advanced 1-2 cm or retracted a similar distance. No evidence for pneumothorax. Otherwise stable exam. Electronically Signed   By: Kennith Center M.D.   On: 05/19/2017 11:32   STUDIES:  TTE 07/2015 >> Mild LVH with LVEF 60-65%. Grade 1 diastolic dysfunction with normal estimated LV filling pressure. Mildly thickened mitral leaflets with trivial mitral regurgitation. Trivial tricuspidregurgitation, unable to estimate PASP. LHC 05/19/2017 >> moderate LAD/Diagonal disease. No intervention done  ECHO 1/2  >  CULTURES: none  ANTIBIOTICS: none  SIGNIFICANT EVENTS: 05/19/2017 >> cardiac arrest, LHC  LINES/TUBES: PIV x 3 ETT 1/1 >> OGT 1/1>> Foley 1/1 >>  DISCUSSION: 81 yoM w/PMH of LVH and HTN with vfib cardiac arrest from home.  No bystander cpr. Did require sedation, but unknown if movement was purposeful.  Taken for emergency LHC with minimal nonobstructive disease, EF 35%.  Vfib arrest while transferring from cath table to stretcher and additionally on arrival to ICU.  On normothermia protocol with shivering so paralyzed. Rewarming today. Hypertensive but hemodynamically stable.   ASSESSMENT / PLAN:  PULMONARY A: Acute respiratory failure in the setting of cardiac arrest Tobacco abuse  P:   Full MV support with PRVC 8 cc/kg, decrease MV today based on ABG ABG follow CXR in am VAP protocol  CARDIOVASCULAR A:  Vfib cardiac arrest in setting of NSTEMI, CAD, cardiomyopathy  - ROSC after 18 min if not more w/no bystander CPR Acute  systolic HF Prolonged QT - emergent LHC on admit - no intervention, 40% at LAD, EF 35% P:  ICU tele monitoring  Cardiology following Continuing amiodarone, lidocaine Goal K > 4 and mag  > 2 Caution with QT prolonging meds Holding home norvasc, and lisinopril  RENAL A:   Hypokalemia Lactate acidosis At risk AKI P:   Trend BMP, mag Trend urinary output and daily weight  Replace electrolytes as indicated  GASTROINTESTINAL A:   Nutrition  P:   PPI for SUP NPO - start TF tomorrow   NEUROLOGIC A:  Acute encephalopathy- concern for anoxic injury +/- toxic/metabolic - ETOH  161 P:   RASS goal: -4 to - 5 prior to nimbex, once nimbex off today RASS goal 0 to -1 PAD protocol with prn fentanyl and versed TTM at 36 - "re-warming" today - Will attempt to wean sedation once completed  FAMILY  - Updates:  No family available today  - Inter-disciplinary family meet or Palliative Care meeting due by:  05/26/2017  Summary: "Rewarming"  today. Once complete will DC nimbex and wean sedation for neuro assessment.   Joneen Roach, AGACNP-BC [facility] Pulmonology/Critical Care Pager [phone] or [phone]  05/20/2017 9:11 AM

## 2017-05-20 NOTE — Progress Notes (Signed)
RN to bedside for ventilator alarm. Pt coughing and gagging on ETT. RN asked pt to look at her. Pt shook head no. RN asked pt to open eyes and squeeze hands. Pt followed request. Shortly after pt closed eye and no longer responded to voice. Wife at bedside. CCM updated.

## 2017-05-20 NOTE — Progress Notes (Signed)
Initial Nutrition Assessment  DOCUMENTATION CODES:   Not applicable  INTERVENTION:    If TF started, rec Vital AF 1.2 at goal rate of 65 ml/h (1560 ml per day)   Provides 1872 kcals, 117 gm protein, 1265 ml free water daily  NUTRITION DIAGNOSIS:   Inadequate oral intake related to inability to eat as evidenced by NPO status   GOAL:   Patient will meet greater than or equal to 90% of their needs  MONITOR:   Vent status, Labs, Weight trends, Skin, I & O's  REASON FOR ASSESSMENT:   Ventilator  ASSESSMENT:   68 yoM w/PMH of LVH and HTN with vfib cardiac arrest from home.  No bystander cpr. Did require sedation, but unknown if movement was purposeful.  Taken for emergency LHC with minimal nonobstructive disease, EF 35%.  Vfib arrest while transferring from cath table to stretcher and additionally on arrival to ICU.  Patient is currently intubated on ventilator support MV: 11.4 L/min Temp (24hrs), Avg:97 F (36.1 C), Min:94.8 F (34.9 C), Max:99 F (37.2 C)  OGT in place  Pt undergoing 2D-Echo upon RD visit. On 36 degree temperature management protocol. Pt shivering and paralyzed.  He is rewarming today. He is hypertensive but hemodynamically stable. Labs and medications reviewed. CBG's [id number].  NUTRITION - FOCUSED PHYSICAL EXAM:    Most Recent Value  Orbital Region  Unable to assess  Upper Arm Region  Unable to assess  Thoracic and Lumbar Region  Unable to assess  Buccal Region  Unable to assess  Temple Region  Unable to assess  Clavicle Bone Region  Unable to assess  Clavicle and Acromion Bone Region  Unable to assess  Scapular Bone Region  Unable to assess  Dorsal Hand  Unable to assess  Patellar Region  Unable to assess  Anterior Thigh Region  Unable to assess  Posterior Calf Region  Unable to assess  Edema (RD Assessment)  Unable to assess     Diet Order:  Diet NPO time specified  EDUCATION NEEDS:   No education needs have been identified at  this time  Skin:  Skin Assessment: Reviewed RN Assessment  Last BM:  1/1   Intake/Output Summary (Last 24 hours) at 05/20/2017 1243 Last data filed at 05/20/2017 1200 Gross per 24 hour  Intake 2932.96 ml  Output 2040 ml  Net 892.96 ml   Height:   Ht Readings from Last 1 Encounters:  05/19/17 5\' 10"  (1.778 m)   Weight:   Wt Readings from Last 1 Encounters:  05/20/17 167 lb 1.7 oz (75.8 kg)   Ideal Body Weight:  75.4 kg  BMI:  Body mass index is 23.98 kg/m.  Estimated Nutritional Needs:   Kcal:  1932  Protein:  110-125 gm  Fluid:  per MD  Maureen Chatters, RD, LDN Pager #: [phone] After-Hours Pager #: [phone]

## 2017-05-20 NOTE — Progress Notes (Signed)
  Echocardiogram 2D Echocardiogram has been performed.  Celene Skeen 05/20/2017, 10:38 AM

## 2017-05-20 NOTE — Progress Notes (Signed)
Bronson Curb RN and Dub Amis RN wasted ~25cc of Versed drip down the sink.

## 2017-05-20 NOTE — Progress Notes (Signed)
Subjective:  Intubated , paralyzed   Objective:  Vitals:   05/20/17 0631 05/20/17 0645 05/20/17 0700 05/20/17 0715  BP:      Pulse: (!) 54 (!) 53 (!) 53 (!) 51  Resp: (!) 26 (!) 26 (!) 26 (!) 26  Temp: 97.9 F (36.6 C)  (!) 97 F (36.1 C) (!) 97 F (36.1 C)  TempSrc: Core  Core Core (Comment)  SpO2: 100% 100% 100% 100%  Weight:      Height:        Intake/Output from previous day:  Intake/Output Summary (Last 24 hours) at 05/20/2017 0757 Last data filed at 05/20/2017 0700 Gross per 24 hour  Intake 3030.26 ml  Output 1715 ml  Net 1315.26 ml    Physical Exam: Intubated / Paralyzed  Black male  HEENT: normal Neck supple with no adenopathy JVP normal no bruits no thyromegaly Lungs clear with no wheezing and good diaphragmatic motion Heart:  S1/S2 no murmur, no rub, gallop or click PMI normal Abdomen: benighn, BS positve, no tenderness, no AAA no bruit.  No HSM or HJR Distal pulses intact with no bruits No edema Neuro non-focal Skin warm and dry    Lab Results: Basic Metabolic Panel: Recent Labs    05/19/17 0650  05/19/17 1552 05/19/17 2314 05/20/17 0318  NA  --    < > 141  --  136  K  --    < > 5.0  --  4.6  CL  --    < > 113*  --  109  CO2  --    < > 19*  --  19*  GLUCOSE  --    < > 103*  --  112*  BUN  --    < > 15  --  13  CREATININE  --    < > 1.00  --  0.79  CALCIUM  --    < > 7.6*  --  7.6*  MG  --    < > 2.8* 2.3 2.1  PHOS 3.5  --   --   --  2.1*   < > = values in this interval not displayed.   Liver Function Tests: Recent Labs    05/19/17 0154 05/20/17 0318  AST 51*  --   ALT 24  --   ALKPHOS 66  --   BILITOT 0.8  --   PROT 6.3*  --   ALBUMIN 3.3* 3.0*   No results for input(s): LIPASE, AMYLASE in the last 72 hours. CBC: Recent Labs    05/19/17 0154 05/19/17 0430  05/19/17 0840 05/20/17 0318  WBC 5.1 12.3*  --   --  9.9  NEUTROABS 1.6*  --   --   --   --   HGB 14.8 16.0   < > 16.3 14.8  HCT 42.8 45.4   < > 48.0 42.3  MCV  87.5 87.8  --   --  86.2  PLT 230 256  --   --  152   < > = values in this interval not displayed.   Cardiac Enzymes: Recent Labs    05/19/17 0430 05/19/17 0650 05/19/17 1552  TROPONINI 2.32* 12.38* 25.35*    Imaging: Dg Chest Port 1 View  Result Date: 05/20/2017 CLINICAL DATA:  47 year old male with endotracheal tube in place. Subsequent encounter. EXAM: PORTABLE CHEST 1 VIEW COMPARISON:  05/19/2017. FINDINGS: Endotracheal tube tip 4.7 cm above the carina. Left central line tip proximal superior vena cava level.  Tip less laterally directed than on prior exam. Subsegmental atelectasis mid left lung and bilateral lower lobe. Slight decrease in degree of central pulmonary vascular prominence. Heart size top-normal. No gross pneumothorax. IMPRESSION: Slight change in position of left central line with tip proximal superior vena cava level. Slight decrease in degree of central pulmonary vascular prominence. Subsegmental atelectasis left mid lung and bibasilar region unchanged. Electronically Signed   By: Genia Del M.D.   On: 05/20/2017 07:35   Dg Chest Port 1 View  Result Date: 05/19/2017 CLINICAL DATA:  Central line placement EXAM: PORTABLE CHEST 1 VIEW COMPARISON:  Earlier the same day FINDINGS: 1037 hours. New left IJ central line identified with the tip overlying the innominate vein confluence. The tip of the catheter is directed laterally and may be against the wall of the venous anatomy. A tracheal tube tip is 3.5 cm above the base of the carina. The NG tube passes into the stomach although the distal tip position is not included on the film. Similar appearance left parahilar opacity. The cardio pericardial silhouette is enlarged. Low volume film without pneumothorax. The visualized bony structures of the thorax are intact. Telemetry leads overlie the chest. IMPRESSION: Left IJ central line tip is positioned in the region of the innominate vein confluence with the tip directed laterally,  potentially generating pressure on the wall of the vascular anatomy at this location. If this is a clinical concern, catheter could be advanced 1-2 cm or retracted a similar distance. No evidence for pneumothorax. Otherwise stable exam. Electronically Signed   By: Misty Stanley M.D.   On: 05/19/2017 11:32   Dg Chest Port 1 View  Result Date: 05/19/2017 CLINICAL DATA:  Endotracheal tube and orogastric tube placement. EXAM: PORTABLE CHEST 1 VIEW COMPARISON:  Chest radiograph performed earlier today at 1:38 a.m. FINDINGS: The patient's endotracheal tube is seen ending 6-7 cm above the carina. This could be advanced 3 cm. An enteric tube is seen extending below the diaphragm. New left midlung airspace opacity may reflect atelectasis or pneumonia. No pleural effusion or pneumothorax is seen. The cardiomediastinal silhouette is borderline normal in size. No acute osseous abnormalities are seen. IMPRESSION: 1. Endotracheal tube seen ending 6-7 cm above the carina. This could be advanced 3 cm. 2. Enteric tube seen extending below the diaphragm. 3. New left midlung airspace opacity may reflect atelectasis or pneumonia. Electronically Signed   By: Garald Balding M.D.   On: 05/19/2017 04:59   Dg Chest Portable 1 View  Result Date: 05/19/2017 CLINICAL DATA:  Acute onset of asystole, status post CPR. EXAM: PORTABLE CHEST 1 VIEW COMPARISON:  Chest radiograph performed 03/01/2017, and CTA of the chest performed 03/02/2017 FINDINGS: The patient's endotracheal tube is seen ending 3 cm above the carina. An enteric tube is noted extending below the diaphragm. Minimal right midlung opacity may reflect atelectasis. No pleural effusion or pneumothorax is seen. The cardiomediastinal silhouette is borderline normal in size. No acute osseous abnormalities are seen. External pacing pads are noted. IMPRESSION: 1. Endotracheal tube seen ending 3 cm above the carina. 2. Minimal right midlung opacity may reflect atelectasis. Electronically  Signed   By: Garald Balding M.D.   On: 05/19/2017 02:06    Cardiac Studies:  ECG:  SR LVH PVC   Telemetry:  NSR 05/20/2017   Echo: pending   Medications:   . artificial tears  1 application Both Eyes G9J  . aspirin  81 mg Oral Daily  . chlorhexidine gluconate (MEDLINE KIT)  15  mL Mouth Rinse BID  . Chlorhexidine Gluconate Cloth  6 each Topical Daily  . heparin  5,000 Units Subcutaneous Q8H  . insulin aspart  1-3 Units Subcutaneous Q4H  . mouth rinse  15 mL Mouth Rinse 10 times per day  . metoprolol tartrate  2.5 mg Intravenous Q6H  . pantoprazole (PROTONIX) IV  40 mg Intravenous QHS  . sodium chloride flush  10-40 mL Intracatheter Q12H     . sodium chloride 10 mL/hr at 05/19/17 2000  . amiodarone 60 mg/hr (05/20/17 0538)  . cisatracurium (NIMBEX) infusion 2.5 mcg/kg/min (05/20/17 0005)  . fentaNYL 225 mcg/hr (05/20/17 0600)  . lidocaine 2 mg/min (05/19/17 2155)  . midazolam (VERSED) infusion 3 mg/hr (05/20/17 0143)  . norepinephrine (LEVOPHED) Adult infusion Stopped (05/19/17 1400)    Assessment/Plan:  Cardiac Arrest:  No intervention done cath with moderate LAD/Diagonal disease Troponin coming down Echo still pending for EF. NSVT improved amiodarone decreased to 33m/hr and will stop lidocaine in  Am. Warming now should help bring BP down if not can consider iv nitro or hydralazine   PJenkins Rouge1/06/2017, 7:57 AM

## 2017-05-21 ENCOUNTER — Inpatient Hospital Stay (HOSPITAL_COMMUNITY): Payer: BLUE CROSS/BLUE SHIELD

## 2017-05-21 DIAGNOSIS — I219 Acute myocardial infarction, unspecified: Secondary | ICD-10-CM

## 2017-05-21 DIAGNOSIS — I469 Cardiac arrest, cause unspecified: Secondary | ICD-10-CM

## 2017-05-21 LAB — GLUCOSE, CAPILLARY
GLUCOSE-CAPILLARY: 95 mg/dL (ref 65–99)
GLUCOSE-CAPILLARY: 90 mg/dL (ref 65–99)
GLUCOSE-CAPILLARY: 43 mg/dL — AB (ref 65–99)
GLUCOSE-CAPILLARY: 72 mg/dL (ref 65–99)
Glucose-Capillary: 75 mg/dL (ref 65–99)
Glucose-Capillary: 86 mg/dL (ref 65–99)
Glucose-Capillary: 92 mg/dL (ref 65–99)

## 2017-05-21 LAB — CBC
HEMATOCRIT: 38.1 % — AB (ref 39.0–52.0)
HEMOGLOBIN: 12.9 g/dL — AB (ref 13.0–17.0)
MCH: 29 pg (ref 26.0–34.0)
MCHC: 33.9 g/dL (ref 30.0–36.0)
MCV: 85.6 fL (ref 78.0–100.0)
Platelets: 140 10*3/uL — ABNORMAL LOW (ref 150–400)
RBC: 4.45 MIL/uL (ref 4.22–5.81)
RDW: 14.3 % (ref 11.5–15.5)
WBC: 10.1 10*3/uL (ref 4.0–10.5)

## 2017-05-21 LAB — BASIC METABOLIC PANEL
Anion gap: 7 (ref 5–15)
BUN: 14 mg/dL (ref 6–20)
CALCIUM: 7.9 mg/dL — AB (ref 8.9–10.3)
CHLORIDE: 107 mmol/L (ref 101–111)
CO2: 24 mmol/L (ref 22–32)
CREATININE: 0.93 mg/dL (ref 0.61–1.24)
GFR calc Af Amer: >60 mL/min (ref >60)
GFR calc non Af Amer: >60 mL/min (ref >60)
GLUCOSE: 93 mg/dL (ref 65–99)
Potassium: 3.8 mmol/L (ref 3.5–5.1)
Sodium: 138 mmol/L (ref 135–145)

## 2017-05-21 LAB — MAGNESIUM: Magnesium: 2 mg/dL (ref 1.7–2.4)

## 2017-05-21 MED ORDER — HALOPERIDOL LACTATE 5 MG/ML IJ SOLN
5.0000 mg | Freq: Once | INTRAMUSCULAR | Status: AC
  Administered 2017-05-21: 5 mg via INTRAVENOUS
  Filled 2017-05-21: qty 1

## 2017-05-21 MED ORDER — METOCLOPRAMIDE HCL 5 MG/ML IJ SOLN
10.0000 mg | Freq: Once | INTRAMUSCULAR | Status: DC | PRN
Start: 2017-05-21 — End: 2017-05-22

## 2017-05-21 MED ORDER — ACETAMINOPHEN 650 MG RE SUPP
650.0000 mg | RECTAL | Status: DC | PRN
  Administered 2017-05-21: 650 mg via RECTAL
  Filled 2017-05-21: qty 1

## 2017-05-21 MED ORDER — DEXMEDETOMIDINE HCL IN NACL 400 MCG/100ML IV SOLN
0.4000 ug/kg/h | INTRAVENOUS | Status: DC
  Administered 2017-05-21: 0.4 ug/kg/h via INTRAVENOUS
  Administered 2017-05-22 (×3): 1.7 ug/kg/h via INTRAVENOUS
  Administered 2017-05-22: 0.2 ug/kg/h via INTRAVENOUS
  Filled 2017-05-21 (×8): qty 100

## 2017-05-21 NOTE — Progress Notes (Signed)
PULMONARY / CRITICAL CARE MEDICINE   Name: Tony Reynolds MRN: [id number] DOB: 1970/12/04    ADMISSION DATE:  05/19/2017 CONSULTATION DATE:  05/19/2017  REFERRING MD:  Dr. Elesa Massed  CHIEF COMPLAINT:  Cardiac Arrest  HISTORY OF PRESENT ILLNESS:   47 year old male with past medical history of hypertension, current smoker, and LVH who presented from home after cardiac arrest. Patient was reportedly at party and became unresponsive.  No bystander CPR started per EMS.   Found to be in ventricular fibrillation, treated with epi, narcan, and defibrillation.  ROSC obtained after estimated 18 minutes.  On arrival to ER, patient started on amiodarone gtt for frequent ectopy.  There was concern over ST/Twave changes and therefore patient for emergency cardiac catheterization.  Unknown if patient made any purposeful movements therefore ice packs applied in ER.  PCCM to admit.     SUBJECTIVE:   Has intermittently tolerated some pressure support today but then agitated when fentanyl decreased significantly  VITAL SIGNS: BP 101/69   Pulse (!) 57   Temp 97.7 F (36.5 C)   Resp 18   Ht 5\' 10"  (1.778 m)   Wt 75 kg (165 lb 5.5 oz)   SpO2 100%   BMI 23.72 kg/m   HEMODYNAMICS: CVP:  [9 mmHg-10 mmHg] 9 mmHg  VENTILATOR SETTINGS: Vent Mode: PRVC FiO2 (%):  [40 %] 40 % Set Rate:  [20 bmp] 20 bmp Vt Set:  [580 mL] 580 mL PEEP:  [5 cmH20] 5 cmH20 Plateau Pressure:  [16 cmH20-28 cmH20] 16 cmH20  INTAKE / OUTPUT: I/O last 3 completed shifts: In: 2871.8 [I.V.:2711.8; Other:50; NG/GT:110] Out: 2195 [Urine:1595; Emesis/NG output:600]  PHYSICAL EXAMINATION:  General: Ill-appearing man, mechanically ventilated intermittently agitated HEENT: Endotracheal tube in place, OG tube in place, no lesions Neuro: Wakes to voice, gestures that he wants his endotracheal tube out, strong in all extremities CV: Regular, tachycardic, no murmur PULM: Coarse bilaterally, no wheezing GI: Soft, nontender, hypoactive bowel  sounds Extremities: No deformities no edema Skin: No rash  LABS:  BMET Recent Labs  Lab 05/19/17 1552 05/20/17 0318 05/21/17 0428  NA 141 136 138  K 5.0 4.6 3.8  CL 113* 109 107  CO2 19* 19* 24  BUN 15 13 14   CREATININE 1.00 0.79 0.93  GLUCOSE 103* 112* 93    Electrolytes Recent Labs  Lab 05/19/17 0650  05/19/17 1552 05/19/17 2314 05/20/17 0318 05/21/17 0428  CALCIUM  --    < > 7.6*  --  7.6* 7.9*  MG  --    < > 2.8* 2.3 2.1  2.1 2.0  PHOS 3.5  --   --   --  2.1*  --    < > = values in this interval not displayed.    CBC Recent Labs  Lab 05/19/17 0430  05/19/17 0840 05/20/17 0318 05/21/17 0428  WBC 12.3*  --   --  9.9 10.1  HGB 16.0   < > 16.3 14.8 12.9*  HCT 45.4   < > 48.0 42.3 38.1*  PLT 256  --   --  152 140*   < > = values in this interval not displayed.    Coag's Recent Labs  Lab 05/19/17 0430 05/19/17 1224  APTT 91* 32  INR 1.03 0.98    Sepsis Markers Recent Labs  Lab 05/19/17 0156 05/19/17 0650  LATICACIDVEN 7.86* 5.0*    ABG Recent Labs  Lab 05/19/17 0528 05/19/17 0817 05/20/17 0506  PHART 7.271* 7.242* 7.494*  PCO2ART  36.2 39.9 27.4*  PO2ART 193.0* 156.0* 221.0*    Liver Enzymes Recent Labs  Lab 05/19/17 0154 05/20/17 0318  AST 51*  --   ALT 24  --   ALKPHOS 66  --   BILITOT 0.8  --   ALBUMIN 3.3* 3.0*    Cardiac Enzymes Recent Labs  Lab 05/19/17 0430 05/19/17 0650 05/19/17 1552  TROPONINI 2.32* 12.38* 25.35*    Glucose Recent Labs  Lab 05/20/17 1601 05/20/17 1951 05/20/17 2328 05/21/17 0350 05/21/17 0735 05/21/17 1111  GLUCAP 95 96 89 92 90 43*    Imaging Dg Chest Port 1 View  Result Date: 05/21/2017 CLINICAL DATA:  Intubation.  Respiratory failure. EXAM: PORTABLE CHEST 1 VIEW COMPARISON:  05/20/2017 . FINDINGS: Endotracheal tube, left IJ line, NG tube in stable position. Cardiomegaly. Mild bilateral from interstitial prominence. CHF cannot be excluded. Pneumonitis cannot be excluded. Small  right pleural effusion. No pneumothorax. IMPRESSION: 1. Lines and tubes in stable position. 2. Cardiomegaly with mild bilateral interstitial prominence and small right pleural effusion. Findings suggest mild CHF. Pneumonitis cannot be excluded . Electronically Signed   By: Maisie Fus  Register   On: 05/21/2017 07:36   STUDIES:  TTE 07/2015 >> Mild LVH with LVEF 60-65%. Grade 1 diastolic dysfunction with normal estimated LV filling pressure. Mildly thickened mitral leaflets with trivial mitral regurgitation. Trivial tricuspidregurgitation, unable to estimate PASP. LHC 05/19/2017 >> moderate LAD/Diagonal disease. No intervention done  ECHO 1/2 >  CULTURES: none  ANTIBIOTICS: none  SIGNIFICANT EVENTS: 05/19/2017 >> cardiac arrest, LHC  LINES/TUBES: PIV x 3 ETT 1/1 >> OGT 1/1>> Foley 1/1 >>  DISCUSSION: 89 yoM w/PMH of LVH and HTN with vfib cardiac arrest from home.  No bystander cpr. Did require sedation, but unknown if movement was purposeful.  Taken for emergency LHC with minimal nonobstructive disease, EF 35%.  Vfib arrest while transferring from cath table to stretcher and additionally on arrival to ICU.  On normothermia protocol with shivering so paralyzed. Rewarming today. Hypertensive but hemodynamically stable.   ASSESSMENT / PLAN:  PULMONARY A: Acute respiratory failure in the setting of cardiac arrest Tobacco abuse  P:   Given his neurological improvement we will assess for spontaneous breathing today.  Extubation if he meets criteria Follow chest x-ray  CARDIOVASCULAR A:  Vfib cardiac arrest in setting of NSTEMI, CAD, cardiomyopathy  - ROSC after 18 min if not more w/no bystander CPR Acute systolic HF Prolonged QT - emergent LHC on admit - no intervention, 40% at LAD, EF 35% P:  Lidocaine discontinued 1/3 Amiodarone continued Goal K > 4 and mag  > 2 Caution with QT prolonging medications Home Norvasc and lisinopril on hold, restart when able Continue Catapres patch,  scheduled metoprolol  RENAL A:   Hypokalemia Lactate acidosis At risk AKI P:   Follow BMP, urine output Replace electrolytes as indicated  GASTROINTESTINAL A:   Nutrition  P:   PPI for SUP Assess for p.o. diet once he is extubated  NEUROLOGIC A:  Acute encephalopathy- concern for anoxic injury +/- toxic/metabolic - ETOH  161 P:   RASS goal: -1 Attempt to wean fentanyl, continue Precedex.   FAMILY  - Updates:  No family available today  - Inter-disciplinary family meet or Palliative Care meeting due by:  05/26/2017   Independent CC time 35 minutes  Levy Pupa, MD, PhD 05/21/2017, 2:26 PM [facility] [phone] or if no answer [phone]

## 2017-05-21 NOTE — Care Management Note (Signed)
Case Management Note Donn Pierini RN, BSN Unit 4E-Case Manager-- 2H coverage [phone]  Patient Details  Name: Tony Reynolds MRN: [id number] Date of Birth: 04-Jul-1970  Subjective/Objective:  Pt admitted s/p cardiac arrest- currently on vent                  Action/Plan: PTA pt lived at home with wife- independent- CM to follow for transition of care needs.   Expected Discharge Date:                  Expected Discharge Plan:     In-House Referral:     Discharge planning Services  CM Consult  Post Acute Care Choice:    Choice offered to:     DME Arranged:    DME Agency:     HH Arranged:    HH Agency:     Status of Service:  In process, will continue to follow  If discussed at Long Length of Stay Meetings, dates discussed:    Discharge Disposition:   Additional Comments:  Darrold Span, RN 05/21/2017, 10:04 AM

## 2017-05-21 NOTE — Progress Notes (Signed)
eLink Physician-Brief Progress Note Patient Name: Tony Reynolds DOB: 1970-09-04 MRN: [id number]   Date of Service  05/21/2017  HPI/Events of Note  Needing frequent versed pushes H/o ETOH use On fent gtt 250  eICU Interventions  Add precedex , monitor qtc     Intervention Category Major Interventions: Delirium, psychosis, severe agitation - evaluation and management  Rakesh V. Alva 05/21/2017, 5:33 AM

## 2017-05-21 NOTE — Progress Notes (Signed)
eLink Physician-Brief Progress Note Patient Name: Tony Reynolds DOB: 17-Jan-1971 MRN: [id number]   Date of Service  05/21/2017  HPI/Events of Note  Ongoing agitation in patient s/p cardiac arrest with recurrent VT.  Now extubated on precedex gtt and PRN versed.  Camera check shows agitation with patient striking out at staff.  eICU Interventions  Plan: Increase precedex gtt to 1.7 mcg One time dose of haldol 5 mg IV - QTc less than 500. Nurse to contact [facility] MD if patient remains agitated 30 minutes after haldol dose.     Intervention Category Major Interventions: Delirium, psychosis, severe agitation - evaluation and management  Terique Kawabata 05/21/2017, 11:14 PM

## 2017-05-21 NOTE — Progress Notes (Signed)
Subjective:  Intubated sedated on precedex now history of ETOH abuse   Objective:  Vitals:   05/21/17 0815 05/21/17 0824 05/21/17 0843 05/21/17 0900  BP: (!) 87/52  (!) 96/43   Pulse: (!) 54  (!) 52 (!) 52  Resp: 20  20 (!) 0  Temp:  97.7 F (36.5 C)    TempSrc:  Axillary    SpO2: 100%  100% 100%  Weight:      Height:        Intake/Output from previous day:  Intake/Output Summary (Last 24 hours) at 05/21/2017 0944 Last data filed at 05/21/2017 0900 Gross per 24 hour  Intake 1917.2 ml  Output 1300 ml  Net 617.2 ml    Physical Exam: Intubated sedated  Black male  HEENT: normal Neck supple with no adenopathy JVP normal no bruits no thyromegaly Lungs clear with no wheezing and good diaphragmatic motion Heart:  S1/S2 no murmur, no rub, gallop or click PMI normal Abdomen: benighn, BS positve, no tenderness, no AAA no bruit.  No HSM or HJR Distal pulses intact with no bruits No edema Neuro non-focal no seizure activity  Skin warm and dry      Lab Results: Basic Metabolic Panel: Recent Labs    05/19/17 0650  05/20/17 0318 05/21/17 0428  NA  --    < > 136 138  K  --    < > 4.6 3.8  CL  --    < > 109 107  CO2  --    < > 19* 24  GLUCOSE  --    < > 112* 93  BUN  --    < > 13 14  CREATININE  --    < > 0.79 0.93  CALCIUM  --    < > 7.6* 7.9*  MG  --    < > 2.1  2.1 2.0  PHOS 3.5  --  2.1*  --    < > = values in this interval not displayed.   Liver Function Tests: Recent Labs    05/19/17 0154 05/20/17 0318  AST 51*  --   ALT 24  --   ALKPHOS 66  --   BILITOT 0.8  --   PROT 6.3*  --   ALBUMIN 3.3* 3.0*   No results for input(s): LIPASE, AMYLASE in the last 72 hours. CBC: Recent Labs    05/19/17 0154  05/20/17 0318 05/21/17 0428  WBC 5.1   < > 9.9 10.1  NEUTROABS 1.6*  --   --   --   HGB 14.8   < > 14.8 12.9*  HCT 42.8   < > 42.3 38.1*  MCV 87.5   < > 86.2 85.6  PLT 230   < > 152 140*   < > = values in this interval not displayed.    Cardiac Enzymes: Recent Labs    05/19/17 0430 05/19/17 0650 05/19/17 1552  TROPONINI 2.32* 12.38* 25.35*    Imaging: Dg Chest Port 1 View  Result Date: 05/21/2017 CLINICAL DATA:  Intubation.  Respiratory failure. EXAM: PORTABLE CHEST 1 VIEW COMPARISON:  05/20/2017 . FINDINGS: Endotracheal tube, left IJ line, NG tube in stable position. Cardiomegaly. Mild bilateral from interstitial prominence. CHF cannot be excluded. Pneumonitis cannot be excluded. Small right pleural effusion. No pneumothorax. IMPRESSION: 1. Lines and tubes in stable position. 2. Cardiomegaly with mild bilateral interstitial prominence and small right pleural effusion. Findings suggest mild CHF. Pneumonitis cannot be excluded . Electronically Signed  By: [place]   On: 05/21/2017 07:36   Dg Chest Port 1 View  Result Date: 05/20/2017 CLINICAL DATA:  47 year old male with endotracheal tube in place. Subsequent encounter. EXAM: PORTABLE CHEST 1 VIEW COMPARISON:  05/19/2017. FINDINGS: Endotracheal tube tip 4.7 cm above the carina. Left central line tip proximal superior vena cava level. Tip less laterally directed than on prior exam. Subsegmental atelectasis mid left lung and bilateral lower lobe. Slight decrease in degree of central pulmonary vascular prominence. Heart size top-normal. No gross pneumothorax. IMPRESSION: Slight change in position of left central line with tip proximal superior vena cava level. Slight decrease in degree of central pulmonary vascular prominence. Subsegmental atelectasis left mid lung and bibasilar region unchanged. Electronically Signed   By: Genia Del M.D.   On: 05/20/2017 07:35   Dg Chest Port 1 View  Result Date: 05/19/2017 CLINICAL DATA:  Central line placement EXAM: PORTABLE CHEST 1 VIEW COMPARISON:  Earlier the same day FINDINGS: 1037 hours. New left IJ central line identified with the tip overlying the innominate vein confluence. The tip of the catheter is directed laterally and  may be against the wall of the venous anatomy. A tracheal tube tip is 3.5 cm above the base of the carina. The NG tube passes into the stomach although the distal tip position is not included on the film. Similar appearance left parahilar opacity. The cardio pericardial silhouette is enlarged. Low volume film without pneumothorax. The visualized bony structures of the thorax are intact. Telemetry leads overlie the chest. IMPRESSION: Left IJ central line tip is positioned in the region of the innominate vein confluence with the tip directed laterally, potentially generating pressure on the wall of the vascular anatomy at this location. If this is a clinical concern, catheter could be advanced 1-2 cm or retracted a similar distance. No evidence for pneumothorax. Otherwise stable exam. Electronically Signed   By: Misty Stanley M.D.   On: 05/19/2017 11:32    Cardiac Studies:  ECG:  SR LVH PVC   Telemetry:  NSR 05/21/2017   Echo   EF 20-25%   Medications:   . aspirin  81 mg Oral Daily  . chlorhexidine gluconate (MEDLINE KIT)  15 mL Mouth Rinse BID  . Chlorhexidine Gluconate Cloth  6 each Topical Daily  . cloNIDine  0.1 mg Transdermal Weekly  . heparin  5,000 Units Subcutaneous Q8H  . insulin aspart  1-3 Units Subcutaneous Q4H  . mouth rinse  15 mL Mouth Rinse 10 times per day  . metoprolol tartrate  5 mg Intravenous Q8H  . pantoprazole (PROTONIX) IV  40 mg Intravenous QHS  . sodium chloride flush  10-40 mL Intracatheter Q12H     . sodium chloride 10 mL/hr at 05/21/17 0900  . amiodarone 30 mg/hr (05/21/17 0900)  . dexmedetomidine (PRECEDEX) IV infusion 0.401 mcg/kg/hr (05/21/17 0900)  . fentaNYL 150 mcg/hr (05/21/17 0900)  . lidocaine 2 mg/min (05/21/17 0900)    Assessment/Plan:  Cardiac Arrest:  No intervention done cath with moderate LAD/Diagonal disease Troponin coming down Echo with EF 20-25% . NSVT improved amiodarone decreased to 61m/hr and will stop lidocaine  Neuro W/u per CCM MRI  EEG if he has recovery will need EP to see for AICD    PJenkins Rouge1/07/2017, 9:44 AM

## 2017-05-21 NOTE — Progress Notes (Signed)
Progress Note  Patient Name: Tony Reynolds Date of Encounter: 05/21/2017  Primary Cardiologist: Dr. Burt Knack, new  Subjective   Pt intubated and sedated. Nurse says he is beginning to fight the breathing tube. Wife at bedside. She has high hopes for a good recovery.   Inpatient Medications    Scheduled Meds: . aspirin  81 mg Oral Daily  . chlorhexidine gluconate (MEDLINE KIT)  15 mL Mouth Rinse BID  . Chlorhexidine Gluconate Cloth  6 each Topical Daily  . cloNIDine  0.1 mg Transdermal Weekly  . heparin  5,000 Units Subcutaneous Q8H  . insulin aspart  1-3 Units Subcutaneous Q4H  . mouth rinse  15 mL Mouth Rinse 10 times per day  . metoprolol tartrate  5 mg Intravenous Q8H  . pantoprazole (PROTONIX) IV  40 mg Intravenous QHS  . sodium chloride flush  10-40 mL Intracatheter Q12H   Continuous Infusions: . sodium chloride 10 mL/hr at 05/21/17 0900  . amiodarone 30 mg/hr (05/21/17 0900)  . dexmedetomidine (PRECEDEX) IV infusion 0.401 mcg/kg/hr (05/21/17 0900)  . fentaNYL 150 mcg/hr (05/21/17 0900)  . lidocaine 2 mg/min (05/21/17 0900)   PRN Meds: acetaminophen, fentaNYL, hydrALAZINE, midazolam, sodium chloride flush   Vital Signs    Vitals:   05/21/17 0815 05/21/17 0824 05/21/17 0843 05/21/17 0900  BP: (!) 87/52  (!) 96/43   Pulse: (!) 54  (!) 52 (!) 52  Resp: 20  20 (!) 0  Temp:  97.7 F (36.5 C)    TempSrc:  Axillary    SpO2: 100%  100% 100%  Weight:      Height:        Intake/Output Summary (Last 24 hours) at 05/21/2017 0922 Last data filed at 05/21/2017 0900 Gross per 24 hour  Intake 1917.2 ml  Output 1240 ml  Net 677.2 ml   Filed Weights   05/19/17 0139 05/20/17 0500 05/21/17 0500  Weight: 150 lb (68 kg) 167 lb 1.7 oz (75.8 kg) 165 lb 5.5 oz (75 kg)    Telemetry    Sinus bradycardia/sinus rhythm 50's-60's - Personally Reviewed  ECG    No new tracings - Personally Reviewed  Physical Exam   GEN: Acutely ill male   Neck: No JVD Cardiac: RRR, no  murmurs, rubs, or gallops.  Respiratory: Clear in upper lobes, lower not assessed due to equipment. GI: Soft, nontender, non-distended  MS: No edema; No deformity. Neuro:  sedated  Psych: sedated  Labs    Chemistry Recent Labs  Lab 05/19/17 0154  05/19/17 1552 05/20/17 0318 05/21/17 0428  NA 139   < > 141 136 138  K 2.8*   < > 5.0 4.6 3.8  CL 109   < > 113* 109 107  CO2 13*   < > 19* 19* 24  GLUCOSE 180*   < > 103* 112* 93  BUN 9   < > _0 CREATININE 1.12   < > 1.00 0.79 0.93  CALCIUM 7.6*   < > 7.6* 7.6* 7.9*  PROT 6.3*  --   --   --   --   ALBUMIN 3.3*  --   --  3.0*  --   AST 51*  --   --   --   --   ALT 24  --   --   --   --   ALKPHOS 66  --   --   --   --   BILITOT 0.8  --   --   --   --  GFRNONAA >60   < > >60 >60 >60  GFRAA >60   < > >60 >60 >60  ANIONGAP 17*   < > _0 < > = values in this interval not displayed.     Hematology Recent Labs  Lab 05/19/17 0430  05/19/17 0840 05/20/17 0318 05/21/17 0428  WBC 12.3*  --   --  9.9 10.1  RBC 5.17  --   --  4.91 4.45  HGB 16.0   < > 16.3 14.8 12.9*  HCT 45.4   < > 48.0 42.3 38.1*  MCV 87.8  --   --  86.2 85.6  MCH 30.9  --   --  30.1 29.0  MCHC 35.2  --   --  35.0 33.9  RDW 14.1  --   --  14.3 14.3  PLT 256  --   --  152 140*   < > = values in this interval not displayed.    Cardiac Enzymes Recent Labs  Lab 05/19/17 0154 05/19/17 0430 05/19/17 0650 05/19/17 1552  TROPONINI 0.17* 2.32* 12.38* 25.35*    Recent Labs  Lab 05/19/17 0150  TROPIPOC 0.10*     BNPNo results for input(s): BNP, PROBNP in the last 168 hours.   DDimer No results for input(s): DDIMER in the last 168 hours.   Radiology    Dg Chest Port 1 View  Result Date: 05/21/2017 CLINICAL DATA:  Intubation.  Respiratory failure. EXAM: PORTABLE CHEST 1 VIEW COMPARISON:  05/20/2017 . FINDINGS: Endotracheal tube, left IJ line, NG tube in stable position. Cardiomegaly. Mild bilateral from interstitial prominence. CHF cannot be  excluded. Pneumonitis cannot be excluded. Small right pleural effusion. No pneumothorax. IMPRESSION: 1. Lines and tubes in stable position. 2. Cardiomegaly with mild bilateral interstitial prominence and small right pleural effusion. Findings suggest mild CHF. Pneumonitis cannot be excluded . Electronically Signed   By: Marcello Moores  Register   On: 05/21/2017 07:36   Dg Chest Port 1 View  Result Date: 05/20/2017 CLINICAL DATA:  47 year old male with endotracheal tube in place. Subsequent encounter. EXAM: PORTABLE CHEST 1 VIEW COMPARISON:  05/19/2017. FINDINGS: Endotracheal tube tip 4.7 cm above the carina. Left central line tip proximal superior vena cava level. Tip less laterally directed than on prior exam. Subsegmental atelectasis mid left lung and bilateral lower lobe. Slight decrease in degree of central pulmonary vascular prominence. Heart size top-normal. No gross pneumothorax. IMPRESSION: Slight change in position of left central line with tip proximal superior vena cava level. Slight decrease in degree of central pulmonary vascular prominence. Subsegmental atelectasis left mid lung and bibasilar region unchanged. Electronically Signed   By: Genia Del M.D.   On: 05/20/2017 07:35   Dg Chest Port 1 View  Result Date: 05/19/2017 CLINICAL DATA:  Central line placement EXAM: PORTABLE CHEST 1 VIEW COMPARISON:  Earlier the same day FINDINGS: 1037 hours. New left IJ central line identified with the tip overlying the innominate vein confluence. The tip of the catheter is directed laterally and may be against the wall of the venous anatomy. A tracheal tube tip is 3.5 cm above the base of the carina. The NG tube passes into the stomach although the distal tip position is not included on the film. Similar appearance left parahilar opacity. The cardio pericardial silhouette is enlarged. Low volume film without pneumothorax. The visualized bony structures of the thorax are intact. Telemetry leads overlie the chest.  IMPRESSION: Left IJ central line tip is positioned in the region of  the innominate vein confluence with the tip directed laterally, potentially generating pressure on the wall of the vascular anatomy at this location. If this is a clinical concern, catheter could be advanced 1-2 cm or retracted a similar distance. No evidence for pneumothorax. Otherwise stable exam. Electronically Signed   By: Misty Stanley M.D.   On: 05/19/2017 11:32    Cardiac Studies   Cardiac catheterization 05/19/2017 Conclusion  1. Mild-moderate proximal LAD stenosis does not appear flow-limiting 2. Severe diagonal stenosis involving a medium caliber first diagonal with TIMI-3 flow 3. Angiographically normal left main, LCx, and RCA 4. Moderately severe segmental LV systolic dysfunction with severely elevated LVEDP  Plan: Tx CCU, discussed with CCM team, hypothermia protocol/supportive care, continue amiodarone. If recurrent VF add lidocaine. Replete K.    Echocardiogram 05/20/2017 Study Conclusions - Left ventricle: The cavity size was normal. There was moderate   concentric hypertrophy. Systolic function was severely reduced.   The estimated ejection fraction was in the range of 20% to 25%.   Diffuse hypokinesis. Features are consistent with a pseudonormal   left ventricular filling pattern, with concomitant abnormal   relaxation and increased filling pressure (grade 2 diastolic   dysfunction). Doppler parameters are consistent with elevated   ventricular end-diastolic filling pressure. - Aortic valve: There was no regurgitation. - Aortic root: The aortic root was normal in size. - Mitral valve: There was mild regurgitation. - Left atrium: The atrium was mildly dilated. - Right ventricle: Systolic function was normal. - Right atrium: The atrium was normal in size. - Tricuspid valve: There was mild regurgitation. - Pulmonic valve: There was no regurgitation. - Pulmonary arteries: Systolic pressure was mildly  increased. PA   peak pressure: 39 mm Hg (S). - Inferior vena cava: The vessel was dilated. The respirophasic   diameter changes were blunted (< 50%), consistent with elevated   central venous pressure. - Pericardium, extracardiac: There was no pericardial effusion.  Impressions: - When compared to the prior study from 08/02/2015 LVEF has   significantly decreased from 60-65% to 20-25% with diffuse   hypokinesis.    Patient Profile     47 y.o. male with admitted with out of hospital cardiac arrest with recurrent ventricular fibrillation. Amiodarone started and cooling protocol. Pt found to have HTN, tobacco use and LVH. Cath on 05/19/17- no culprit lesions but Moderately severe segmental LV systolic dysfunction with severely elevated LVEDP. Echo done on 1/2 showed severely reduced LV funciton with EF 20-25%, diffuse hypokinesis. Troponin up to 25.35.  Assessment & Plan    1. Post cardiac arrest- v fib: Pt found unresponsive. He was cooled, now in the rewarming process. Still intubated and sedated. Cath showed moderate LAD/Diagonal disease, no culprit to intervene on. Echo showed severely reduced LV function with EF 20-25%, diffuse hypokinesis. Maintaining sinus brady/sinus rhythm with no ectopy. Continuing with amiodarone load. CXR today suggestive of mild CHF. Monitor fluid balance.   2. Hypertension -Bp was running high, metoprolol increased to 5 mg q 8h and clonidine patch added yesterday.  -BP down significantly, may need to pull back on meds. Assess once he come off of sedation.   3. Anoxic encephalopathy -pt beginning to fight the breathing tube. Plan to wean soon and then will have a better idea of cognitive function.   For questions or updates, please contact [place] Please consult [email or web address] for contact info under Cardiology/STEMI.      Signed, Daune Perch, NP  05/21/2017, 9:22 AM    See my  note from today. Will d/c lidocaine needs MRI and EEG to further assess  neurologic status if recovers Will need EP to see for AICD.  Jenkins Rouge

## 2017-05-21 NOTE — Procedures (Signed)
Extubation Procedure Note  Patient Details:   Name: CHARVEZ PRIVOTT DOB: 08-31-1970 MRN: [id number]   Airway Documentation:    Positive cuff leak prior to extubation.  Evaluation  O2 sats: stable throughout Complications: No apparent complications Patient did tolerate procedure well. Bilateral Breath Sounds: Clear, Diminished   Yes   RN at bedside during extubation. No stridor or distress noted. Pt placed on  4L [place].  Julieanne Manson 05/21/2017, 2:13 PM

## 2017-05-22 ENCOUNTER — Other Ambulatory Visit: Payer: Self-pay

## 2017-05-22 LAB — CBC
HEMATOCRIT: 34.4 % — AB (ref 39.0–52.0)
HEMOGLOBIN: 11.8 g/dL — AB (ref 13.0–17.0)
MCH: 29.8 pg (ref 26.0–34.0)
MCHC: 34.3 g/dL (ref 30.0–36.0)
MCV: 86.9 fL (ref 78.0–100.0)
Platelets: 132 10*3/uL — ABNORMAL LOW (ref 150–400)
RBC: 3.96 MIL/uL — AB (ref 4.22–5.81)
RDW: 13.8 % (ref 11.5–15.5)
WBC: 7.9 10*3/uL (ref 4.0–10.5)

## 2017-05-22 LAB — GLUCOSE, CAPILLARY
GLUCOSE-CAPILLARY: 104 mg/dL — AB (ref 65–99)
GLUCOSE-CAPILLARY: 81 mg/dL (ref 65–99)
Glucose-Capillary: 88 mg/dL (ref 65–99)
Glucose-Capillary: 93 mg/dL (ref 65–99)
Glucose-Capillary: 81 mg/dL (ref 65–99)
Glucose-Capillary: 97 mg/dL (ref 65–99)

## 2017-05-22 LAB — BASIC METABOLIC PANEL
ANION GAP: 7 (ref 5–15)
BUN: 16 mg/dL (ref 6–20)
CALCIUM: 7.9 mg/dL — AB (ref 8.9–10.3)
CO2: 27 mmol/L (ref 22–32)
Chloride: 104 mmol/L (ref 101–111)
Creatinine, Ser: 1 mg/dL (ref 0.61–1.24)
GFR calc non Af Amer: >60 mL/min (ref >60)
GLUCOSE: 96 mg/dL (ref 65–99)
POTASSIUM: 3.4 mmol/L — AB (ref 3.5–5.1)
Sodium: 138 mmol/L (ref 135–145)

## 2017-05-22 LAB — MAGNESIUM: MAGNESIUM: 2.3 mg/dL (ref 1.7–2.4)

## 2017-05-22 MED ORDER — FOLIC ACID 5 MG/ML IJ SOLN
1.0000 mg | Freq: Every day | INTRAMUSCULAR | Status: DC
  Administered 2017-05-22 – 2017-05-27 (×5): 1 mg via INTRAVENOUS
  Filled 2017-05-22 (×8): qty 0.2

## 2017-05-22 MED ORDER — THIAMINE HCL 100 MG/ML IJ SOLN
100.0000 mg | Freq: Every day | INTRAMUSCULAR | Status: DC
  Administered 2017-05-22 – 2017-05-27 (×5): 100 mg via INTRAVENOUS
  Filled 2017-05-22 (×5): qty 2

## 2017-05-22 MED ORDER — PNEUMOCOCCAL VAC POLYVALENT 25 MCG/0.5ML IJ INJ: 0.5000 mL | INJECTION | INTRAMUSCULAR | Status: DC | PRN

## 2017-05-22 MED ORDER — CHLORHEXIDINE GLUCONATE 0.12 % MT SOLN
15.0000 mL | Freq: Two times a day (BID) | OROMUCOSAL | Status: DC
  Administered 2017-05-22 – 2017-05-28 (×9): 15 mL via OROMUCOSAL
  Filled 2017-05-22 (×11): qty 15

## 2017-05-22 MED ORDER — ORAL CARE MOUTH RINSE
15.0000 mL | Freq: Two times a day (BID) | OROMUCOSAL | Status: DC
  Administered 2017-05-22 – 2017-05-28 (×5): 15 mL via OROMUCOSAL

## 2017-05-22 MED ORDER — POTASSIUM CHLORIDE 10 MEQ/100ML IV SOLN: 10.0000 meq | INTRAVENOUS | Status: DC

## 2017-05-22 MED ORDER — INFLUENZA VAC SPLIT QUAD 0.5 ML IM SUSY: 0.5000 mL | PREFILLED_SYRINGE | INTRAMUSCULAR | Status: DC | PRN

## 2017-05-22 MED ORDER — MIDAZOLAM HCL 2 MG/2ML IJ SOLN: 2.0000 mg | INTRAMUSCULAR | Status: DC | PRN

## 2017-05-22 MED ORDER — HALOPERIDOL LACTATE 5 MG/ML IJ SOLN
2.5000 mg | Freq: Four times a day (QID) | INTRAMUSCULAR | Status: DC | PRN
  Administered 2017-05-22 (×2): 2.5 mg via INTRAVENOUS
  Filled 2017-05-22 (×2): qty 1

## 2017-05-22 MED ORDER — POTASSIUM CHLORIDE 10 MEQ/50ML IV SOLN
10.0000 meq | INTRAVENOUS | Status: AC
  Administered 2017-05-22 (×2): 10 meq via INTRAVENOUS
  Filled 2017-05-22 (×2): qty 50

## 2017-05-22 MED FILL — Medication: Qty: 1 | Status: AC

## 2017-05-22 NOTE — Progress Notes (Signed)
RN attempted swallow screen on patient.  Patient failed as he coughed with water.  Patient remains NPO and will need swallow evaluation.

## 2017-05-22 NOTE — Progress Notes (Addendum)
Patient extremely agitated on 1.2 of precedex and a recent administration of 2mg  of Versed.  RN attempted to wake up sister who is in the room but she was sleeping very soundly.  Patient combative, verbally abusive, and trying to get out of the bed with 4-5 RNs needing to hold him down.  Precedex upped to 1.7, 1 time order for haldol given, and bilateral wrist restraints placed and mits on. Safety sitter ordered.    RN will continue to monitor patient closely.

## 2017-05-22 NOTE — Progress Notes (Signed)
Inpatient Rehabilitation  Per SLP request, patient was screened by Fae Pippin for appropriateness for an Inpatient Acute Rehab consult.  At this time we will follow along for OT/PT recommendations prior to requesting a formal consult order.  Call if questions.   Charlane Ferretti., CCC/SLP Admission Coordinator  [facility] Inpatient Rehabilitation  Cell [phone]

## 2017-05-22 NOTE — Evaluation (Signed)
Clinical/Bedside Swallow Evaluation Patient Details  Name: Tony Reynolds MRN: [id number] Date of Birth: August 17, 1970  Today's Date: 05/22/2017 Time: SLP Start Time (ACUTE ONLY): 1430 SLP Stop Time (ACUTE ONLY): 1443 SLP Time Calculation (min) (ACUTE ONLY): 13 min  Past Medical History:  Past Medical History:  Diagnosis Date  . Arthritis   . Back pain   . Eczema   . Eczema   . Hypertension   . Lumbar disc herniation    Past Surgical History:  Past Surgical History:  Procedure Laterality Date  . BILATERAL ANTERIOR TOTAL HIP ARTHROPLASTY Bilateral 12/14/2015   Procedure: BILATERAL ANTERIOR TOTAL HIP ARTHROPLASTY;  Surgeon: Kathryne Hitch, MD;  Location: [facility] ORS;  Service: Orthopedics;  Laterality: Bilateral;  . CORONARY/GRAFT ACUTE MI REVASCULARIZATION N/A 05/19/2017   Procedure: Coronary/Graft Acute MI Revascularization;  Surgeon: Tonny Bollman, MD;  Location: [facility] INVASIVE CV LAB;  Service: Cardiovascular;  Laterality: N/A;  . LEFT HEART CATH AND CORONARY ANGIOGRAPHY N/A 05/19/2017   Procedure: LEFT HEART CATH AND CORONARY ANGIOGRAPHY;  Surgeon: Tonny Bollman, MD;  Location: [facility] INVASIVE CV LAB;  Service: Cardiovascular;  Laterality: N/A;  . NO PAST SURGERIES     HPI:  Patient is a 47 year old male with past medical history of hypertension, current smoker, LVH presenting with cardiac arrest from home. He had no bystander CPR per accounts from the chart and family initial rhythm V. fib duration of code 18 minutes patient intubated EKG showing ST-T wave changes cardiology consulted taken to Cath Lab. Catheterization showed minimal occlusive disease nothing requiring intervention. However, patient had another V. fib episode on cath table and received defibrillation x1. Upon arrival to to heart patient continued to have bigeminy trigeminy required amiodarone bolus followed by amio Infusion followed by mag followed by calcium followed by lidocaine infusion.   Assessment / Plan /  Recommendation Clinical Impression  Pt presents s/p cardiac arrest with brief intubation (2 days - extubated 05/21/2017) who is currently NPO. At present pt is mildly confused and appears encephalopic with significantly poor task tolerance. Although pt's voice is hoarse, he is able to achieve voicing especially when frustrated. Pt presents with largely functional oropharyngeal abilities that are limited by mentation and poor task tolerance. Pt consumed ice chips, thin water via spoon, cup sips and applesauce. Pt with 1 cough during consumption of 8 oz water which increased frustration level. Additionally, pt with decreased labial seal around cup d/t mentation and he was even more frustrated when water (1/2 tsp) fell onto gown. Pt refused further intake. Would recommend upgrading to dysphagia 1 with thin liquids via spoon with medicine crushed in puree. Expect pt's diet can be advanced as his mentation improves.  SLP Visit Diagnosis: Dysphagia, unspecified (R13.10)    Aspiration Risk  Mild aspiration risk    Diet Recommendation Dysphagia 1 (Puree);Thin liquid   Liquid Administration via: Cup;Spoon Medication Administration: Crushed with puree Supervision: Staff to assist with self feeding;Full supervision/cueing for compensatory strategies Compensations: Minimize environmental distractions;Slow rate;Small sips/bites Postural Changes: Seated upright at 90 degrees    Other  Recommendations Oral Care Recommendations: Oral care BID   Follow up Recommendations Inpatient Rehab      Frequency and Duration min 2x/week  2 weeks       Prognosis Prognosis for Safe Diet Advancement: Good Barriers to Reach Goals: Cognitive deficits(encephalopic)      Swallow Study   General Date of Onset: 05/19/17 HPI: Patient is a 47 year old male with past medical history of hypertension, current smoker, LVH presenting  with cardiac arrest from home. He had no bystander CPR per accounts from the chart and family  initial rhythm V. fib duration of code 18 minutes patient intubated EKG showing ST-T wave changes cardiology consulted taken to Cath Lab. Catheterization showed minimal occlusive disease nothing requiring intervention. However, patient had another V. fib episode on cath table and received defibrillation x1. Upon arrival to to heart patient continued to have bigeminy trigeminy required amiodarone bolus followed by amio Infusion followed by mag followed by calcium followed by lidocaine infusion. Type of Study: Bedside Swallow Evaluation Previous Swallow Assessment: None in chart Diet Prior to this Study: NPO Temperature Spikes Noted: No Respiratory Status: Room air History of Recent Intubation: Yes Length of Intubations (days): (2) Date extubated: 05/21/17 Behavior/Cognition: Alert;Requires cueing(requires emotional encouragement) Oral Cavity Assessment: Within Functional Limits Oral Care Completed by SLP: Recent completion by staff Oral Cavity - Dentition: Adequate natural dentition Vision: Functional for self-feeding Self-Feeding Abilities: Needs assist Patient Positioning: Upright in bed Baseline Vocal Quality: Hoarse Volitional Cough: Strong Volitional Swallow: Able to elicit    Oral/Motor/Sensory Function Overall Oral Motor/Sensory Function: Within functional limits   Ice Chips Ice chips: Within functional limits Presentation: Spoon Other Comments: (SLP fed d/t pt poor task tolerance)   Thin Liquid Thin Liquid: Impaired Presentation: Cup;Spoon Oral Phase Impairments: Reduced labial seal(With cup sips when holding cup himself d/t mentation) Pharyngeal  Phase Impairments: Cough - Immediate(x 1 during 8 oz)    Nectar Thick Nectar Thick Liquid: Not tested   Honey Thick Honey Thick Liquid: Not tested   Puree Puree: Within functional limits Presentation: Spoon Other Comments: SLP fed d/t poor task tolerance   Solid   GO   Solid: Not tested(pt refused d/t poor task tolerance)         Jacci Ruberg 05/22/2017,4:13 PM

## 2017-05-22 NOTE — Progress Notes (Signed)
200 mL of Fentanyl wasted with Rolla Flatten as witness to amount.   WIS.  Johnnye Sima RN

## 2017-05-22 NOTE — Progress Notes (Addendum)
Nutrition Follow-up  DOCUMENTATION CODES:   Not applicable  INTERVENTION:   If unable to advance diet over the next 24-48 hours, recommend insertion of Cortrak tube and bridle with initiation of tube feeding  Tube Feeding Recommendations: Jevity 1.2 @ 75 ml/hr Pro-Stat 30 mL daily Provides 2260 kcals, 115 g of protein and 1458 mL of free water Meets 100% estimated calorie and protein needs  NUTRITION DIAGNOSIS:   Inadequate oral intake related to inability to eat as evidenced by NPO status.  Continues  GOAL:   Patient will meet greater than or equal to 90% of their needs  Not progressing  MONITOR:   Vent status, Labs, Weight trends, Skin, I & O's  REASON FOR ASSESSMENT:   Ventilator    ASSESSMENT:   47 yoM w/PMH of LVH and HTN with vfib cardiac arrest from home.  No bystander cpr. Did require sedation, but unknown if movement was purposeful.  Taken for emergency LHC with minimal nonobstructive disease, EF 35%.  Vfib arrest while transferring from cath table to stretcher and additionally on arrival to ICU.  1/3 Extubated NPO day 4, pt remains on precedex, agitated at times  Weight trending down; net positive since admission  Labs: reviewed Meds: reviewed   Diet Order:  Diet NPO time specified  EDUCATION NEEDS:   No education needs have been identified at this time  Skin:  Skin Assessment: Reviewed RN Assessment  Last BM:  1/3  Height:   Ht Readings from Last 1 Encounters:  05/19/17 5\' 10"  (1.778 m)    Weight:   Wt Readings from Last 1 Encounters:  05/22/17 162 lb 14.7 oz (73.9 kg)    Ideal Body Weight:  75.4 kg  BMI:  Body mass index is 23.38 kg/m.  Estimated Nutritional Needs:   Kcal:  2200-2500 kcals  Protein:  110-125 g  Fluid:  >/= 2.2 L  Romelle Starcher MS, RD, LDN, CNSC [phone] Pager  [phone] Weekend/On-Call Pager

## 2017-05-22 NOTE — Progress Notes (Signed)
PULMONARY / CRITICAL CARE MEDICINE   Name: Tony WOHLER MRN: [id number] DOB: 1971-03-06    ADMISSION DATE:  05/19/2017 CONSULTATION DATE:  05/19/2017  REFERRING MD:  Dr. Elesa Massed  CHIEF COMPLAINT:  Cardiac Arrest  HISTORY OF PRESENT ILLNESS:   47 year old male with past medical history of hypertension, current smoker, and LVH who presented from home after cardiac arrest. Patient was reportedly at party and became unresponsive.  No bystander CPR started per EMS.   Found to be in ventricular fibrillation, treated with epi, narcan, and defibrillation.  ROSC obtained after estimated 18 minutes.  On arrival to ER, patient started on amiodarone gtt for frequent ectopy.  There was concern over ST/Twave changes and therefore patient for emergency cardiac catheterization.  Unknown if patient made any purposeful movements therefore ice packs applied in ER.  PCCM to admit.     SUBJECTIVE:   Successfully extubated yesterday, however, course now complicated by agitation requiring Precedex infusion.   VITAL SIGNS: BP 118/67   Pulse (!) 55   Temp 98.6 F (37 C) (Core) Comment (Src): foley  Resp 18   Ht 5\' 10"  (1.778 m)   Wt 73.9 kg (162 lb 14.7 oz)   SpO2 94%   BMI 23.38 kg/m   HEMODYNAMICS:    VENTILATOR SETTINGS: Vent Mode: PRVC FiO2 (%):  [40 %] 40 % Set Rate:  [20 bmp] 20 bmp Vt Set:  [580 mL] 580 mL PEEP:  [5 cmH20] 5 cmH20 Plateau Pressure:  [16 cmH20] 16 cmH20  INTAKE / OUTPUT: I/O last 3 completed shifts: In: 2399.4 [I.V.:2239.4; Other:50; NG/GT:60; IV Piggyback:50] Out: 1956 [Urine:1955; Stool:1]  PHYSICAL EXAMINATION:  General: Acutely ill appearing male in NAD HEENT: Normocephalic. PERRL. Black discoloration to 3 areas of tongue that look related to ETT Neuro: Alert, oriented to self and place. Overall confused to situation.  CV: RRR, no MRG PULM: Coarse bilaterally, no wheezing GI: Soft, nontender, hypoactive bowel sounds Extremities: no acute deformity or edema Skin:  Grossly intact  LABS:  BMET Recent Labs  Lab 05/20/17 0318 05/21/17 0428 05/22/17 0345  NA 136 138 138  K 4.6 3.8 3.4*  CL 109 107 104  CO2 19* 24 27  BUN 13 14 16   CREATININE 0.79 0.93 1.00  GLUCOSE 112* 93 96    Electrolytes Recent Labs  Lab 05/19/17 0650  05/20/17 0318 05/21/17 0428 05/22/17 0345  CALCIUM  --    < > 7.6* 7.9* 7.9*  MG  --    < > 2.1  2.1 2.0 2.3  PHOS 3.5  --  2.1*  --   --    < > = values in this interval not displayed.    CBC Recent Labs  Lab 05/20/17 0318 05/21/17 0428 05/22/17 0345  WBC 9.9 10.1 7.9  HGB 14.8 12.9* 11.8*  HCT 42.3 38.1* 34.4*  PLT 152 140* 132*    Coag's Recent Labs  Lab 05/19/17 0430 05/19/17 1224  APTT 91* 32  INR 1.03 0.98    Sepsis Markers Recent Labs  Lab 05/19/17 0156 05/19/17 0650  LATICACIDVEN 7.86* 5.0*    ABG Recent Labs  Lab 05/19/17 0528 05/19/17 0817 05/20/17 0506  PHART 7.271* 7.242* 7.494*  PCO2ART 36.2 39.9 27.4*  PO2ART 193.0* 156.0* 221.0*    Liver Enzymes Recent Labs  Lab 05/19/17 0154 05/20/17 0318  AST 51*  --   ALT 24  --   ALKPHOS 66  --   BILITOT 0.8  --   ALBUMIN 3.3*  3.0*    Cardiac Enzymes Recent Labs  Lab 05/19/17 0430 05/19/17 0650 05/19/17 1552  TROPONINI 2.32* 12.38* 25.35*    Glucose Recent Labs  Lab 05/21/17 1111 05/21/17 1548 05/21/17 1956 05/21/17 2348 05/22/17 0406 05/22/17 0733  GLUCAP 43* 75 72 86 88 104*    Imaging No results found. STUDIES:  TTE 07/2015 >> Mild LVH with LVEF 60-65%. Grade 1 diastolic dysfunction with normal estimated LV filling pressure. Mildly thickened mitral leaflets with trivial mitral regurgitation. Trivial tricuspidregurgitation, unable to estimate PASP. LHC 05/19/2017 >> moderate LAD/Diagonal disease. No intervention done  ECHO 1/2 > LVEF 20-25%, mod hypertrophy, Diffuse HK, Grade 2 DD. PA peak pressure  CULTURES: none  ANTIBIOTICS: none  SIGNIFICANT EVENTS: 05/19/2017 >> cardiac arrest,  LHC  LINES/TUBES: PIV x 3 ETT 1/1 >> 1/3 OGT 1/1>> Foley 1/1 >>  DISCUSSION: 88 yoM w/PMH of LVH and HTN with vfib cardiac arrest from home.  No bystander cpr. Did require sedation, but unknown if movement was purposeful.  Taken for emergency LHC with minimal nonobstructive disease, EF 35%.  Vfib arrest while transferring from cath table to stretcher and additionally on arrival to ICU.  On normothermia protocol with shivering so paralyzed. Rewarming today. Hypertensive but hemodynamically stable.   ASSESSMENT / PLAN:  PULMONARY A: Acute respiratory failure in the setting of cardiac arrest > resolved Tobacco abuse  P:   Incentive spirometry Tobacco cessation counseling prior to discharge.   CARDIOVASCULAR A:  Vfib cardiac arrest in setting of NSTEMI, CAD, cardiomyopathy  - ROSC after 18 min if not more w/no bystander CPR Acute systolic HF Prolonged QT - emergent LHC on admit - no intervention, 40% at LAD, EF 35% P:  Amiodarone continued Goal K > 4 and mag  > 2 (will give K this am) Caution with QTc prolonging medications EKG to monitor QTc Home Norvasc and lisinopril on hold, restart when able Continue Catapres patch, scheduled metoprolol  RENAL A:   Hypokalemia Lactate acidosis At risk AKI P:   Follow BMP, urine output Replace electrolytes as indicated  GASTROINTESTINAL A:   Nutrition  P:   PPI for SUP If able to wean sedation some will eval swallow. If unable will likely need to place NG for enteral nutrition and medications.   NEUROLOGIC A:  Acute encephalopathy- concern for anoxic injury +/- toxic/metabolic - ETOH  768 P:   RASS goal: 0 to -1 Continue Precedex.  Would prefer to wean precedex. If QTC ok, can focus more on PRN haldol or even schedule Seroquel  Currently he is calm and we can hopefully just wean. Consider zyprexa.    FAMILY  - Updates:  Patient and family updated at bedside. Patients questions answered.  - Inter-disciplinary family meet  or Palliative Care meeting due by:  05/26/2017  Joneen Roach, AGACNP-BC [facility] Pager [phone] or [phone]  05/22/2017 9:43 AM

## 2017-05-22 NOTE — Progress Notes (Signed)
  Speech Language Pathology Treatment: Dysphagia  Patient Details Name: Tony Reynolds MRN: [id number] DOB: 1970/11/03 Today's Date: 05/22/2017 Time: 2947-6546 SLP Time Calculation (min) (ACUTE ONLY): 10 min  Assessment / Plan / Recommendation Clinical Impression  Skilled treatment session focused on dysphagia goals. SLP facilitated session by providing Mod A to increase task tolerance and continue consuming trials of PO intake despite minor throat pain. Pt benefited from cues for fast, hard swallow and emotional support. Pt refused further trials of PO intake after applesauce. Recommend upgrading to dysphagia 1 with thin liquids and further upgrade as mentation improves.    HPI HPI: Patient is a 47 year old male with past medical history of hypertension, current smoker, LVH presenting with cardiac arrest from home. He had no bystander CPR per accounts from the chart and family initial rhythm V. fib duration of code 18 minutes patient intubated EKG showing ST-T wave changes cardiology consulted taken to Cath Lab. Catheterization showed minimal occlusive disease nothing requiring intervention. However, patient had another V. fib episode on cath table and received defibrillation x1. Upon arrival to to heart patient continued to have bigeminy trigeminy required amiodarone bolus followed by amio Infusion followed by mag followed by calcium followed by lidocaine infusion.      SLP Plan  Continue with current plan of care       Recommendations  Diet recommendations: Dysphagia 1 (puree);Thin liquid Liquids provided via: Teaspoon;Cup Medication Administration: Crushed with puree Supervision: Staff to assist with self feeding;Full supervision/cueing for compensatory strategies Compensations: Minimize environmental distractions;Slow rate;Small sips/bites Postural Changes and/or Swallow Maneuvers: Seated upright 90 degrees                General recommendations: Rehab consult Oral Care  Recommendations: Oral care BID Follow up Recommendations: Inpatient Rehab SLP Visit Diagnosis: Dysphagia, unspecified (R13.10) Plan: Continue with current plan of care       GO                Elina Streng 05/22/2017, 4:16 PM

## 2017-05-23 DIAGNOSIS — I472 Ventricular tachycardia: Secondary | ICD-10-CM

## 2017-05-23 DIAGNOSIS — J9600 Acute respiratory failure, unspecified whether with hypoxia or hypercapnia: Secondary | ICD-10-CM

## 2017-05-23 LAB — BASIC METABOLIC PANEL
Anion gap: 11 (ref 5–15)
BUN: 14 mg/dL (ref 6–20)
CO2: 24 mmol/L (ref 22–32)
CREATININE: 1.03 mg/dL (ref 0.61–1.24)
Calcium: 8.3 mg/dL — ABNORMAL LOW (ref 8.9–10.3)
Chloride: 104 mmol/L (ref 101–111)
GFR calc Af Amer: >60 mL/min (ref >60)
GLUCOSE: 86 mg/dL (ref 65–99)
Potassium: 3.2 mmol/L — ABNORMAL LOW (ref 3.5–5.1)
SODIUM: 139 mmol/L (ref 135–145)

## 2017-05-23 LAB — MAGNESIUM: MAGNESIUM: 2.4 mg/dL (ref 1.7–2.4)

## 2017-05-23 LAB — GLUCOSE, CAPILLARY
GLUCOSE-CAPILLARY: 76 mg/dL (ref 65–99)
GLUCOSE-CAPILLARY: 132 mg/dL — AB (ref 65–99)
Glucose-Capillary: 132 mg/dL — ABNORMAL HIGH (ref 65–99)

## 2017-05-23 MED ORDER — POTASSIUM CHLORIDE 10 MEQ/100ML IV SOLN: 10.0000 meq | INTRAVENOUS | Status: DC

## 2017-05-23 MED ORDER — AMIODARONE HCL 200 MG PO TABS
400.0000 mg | ORAL_TABLET | Freq: Two times a day (BID) | ORAL | Status: DC
  Administered 2017-05-23 – 2017-05-28 (×11): 400 mg via ORAL
  Filled 2017-05-23 (×11): qty 2

## 2017-05-23 MED ORDER — NICOTINE 7 MG/24HR TD PT24
7.0000 mg | MEDICATED_PATCH | Freq: Every day | TRANSDERMAL | Status: DC
  Administered 2017-05-24 – 2017-05-25 (×2): 7 mg via TRANSDERMAL
  Filled 2017-05-23 (×3): qty 1

## 2017-05-23 MED ORDER — POTASSIUM CHLORIDE CRYS ER 20 MEQ PO TBCR
30.0000 meq | EXTENDED_RELEASE_TABLET | ORAL | Status: DC
  Filled 2017-05-23: qty 1

## 2017-05-23 MED ORDER — POTASSIUM CHLORIDE 20 MEQ/15ML (10%) PO SOLN
30.0000 meq | ORAL | Status: AC
  Administered 2017-05-23 (×2): 30 meq via ORAL
  Filled 2017-05-23 (×2): qty 30

## 2017-05-23 MED ORDER — METOPROLOL TARTRATE 12.5 MG HALF TABLET
12.5000 mg | ORAL_TABLET | Freq: Two times a day (BID) | ORAL | Status: DC
  Administered 2017-05-23 – 2017-05-24 (×3): 12.5 mg via ORAL
  Filled 2017-05-23 (×4): qty 1

## 2017-05-23 MED ORDER — AMLODIPINE BESYLATE 10 MG PO TABS
10.0000 mg | ORAL_TABLET | Freq: Every day | ORAL | Status: DC
  Administered 2017-05-23: 10 mg via ORAL
  Filled 2017-05-23 (×2): qty 1

## 2017-05-23 NOTE — Progress Notes (Signed)
This RN came into patient's room for a bed alarm going off, and the patient's Right IJ central line had been pulled out.  Patient had no s/s of acute respiratory distress and no concerning bleeding complications.  Pt was told to hold breath, and manual pressure was held.  Petroleum guaze and a pressure dressing was placed on neck.  A new PIV was placed and amio is infusing.  RN will continue to monitor patient closely.

## 2017-05-23 NOTE — Progress Notes (Signed)
PULMONARY / CRITICAL CARE MEDICINE   Name: Tony Reynolds MRN: [id number] DOB: 1970/10/27    ADMISSION DATE:  05/19/2017 CONSULTATION DATE:  05/19/2017  REFERRING MD:  Dr. Elesa Massed  CHIEF COMPLAINT:  Cardiac Arrest  HISTORY OF PRESENT ILLNESS:   47 year old male with past medical history of hypertension, current smoker, and LVH who presented from home after cardiac arrest. Patient was reportedly at party and became unresponsive.  No bystander CPR started per EMS.   Found to be in ventricular fibrillation, treated with epi, narcan, and defibrillation.  ROSC obtained after estimated 18 minutes.  On arrival to ER, patient started on amiodarone gtt for frequent ectopy.  There was concern over ST/Twave changes and therefore patient for emergency cardiac catheterization.  Unknown if patient made any purposeful movements therefore ice packs applied in ER.  PCCM to admit.     SUBJECTIVE:  RN questioning if pt can move to the floor, transition to PO amiodarone. Afebrile. 1.4L positive for admit.   VITAL SIGNS: BP (!) 159/88   Pulse 72   Temp 98.7 F (37.1 C) (Oral)   Resp (!) 23   Ht 5\' 10"  (1.778 m)   Wt 160 lb 0.9 oz (72.6 kg)   SpO2 95%   BMI 22.97 kg/m   HEMODYNAMICS: CVP:  [2 mmHg] 2 mmHg  VENTILATOR SETTINGS:    INTAKE / OUTPUT: I/O last 3 completed shifts: In: 2027 [P.O.:360; I.V.:1467; IV Piggyback:200] Out: 2906 [Urine:2905; Stool:1]  PHYSICAL EXAMINATION:  General: adult male in NAD, sitting up in chair HEENT: MM pink/moist, no jvd PSY: calm/joking with family  Neuro: Awake/alert, oriented to self, location, 2018 (missed the new year), appears appropriate  CV: s1s2 rrr, no m/r/g PULM: even/non-labored, lungs bilaterally clear  ON:GEXB, non-tender, bsx4 active  Extremities: warm/dry, RLE larger than LLE Skin: bulla on bilateral knees  LABS:  BMET Recent Labs  Lab 05/21/17 0428 05/22/17 0345 05/23/17 0323  NA 138 138 139  K 3.8 3.4* 3.2*  CL 107 104 104  CO2 24  27 24   BUN 14 16 14   CREATININE 0.93 1.00 1.03  GLUCOSE 93 96 86    Electrolytes Recent Labs  Lab 05/19/17 0650  05/20/17 0318 05/21/17 0428 05/22/17 0345 05/23/17 0323  CALCIUM  --    < > 7.6* 7.9* 7.9* 8.3*  MG  --    < > 2.1  2.1 2.0 2.3 2.4  PHOS 3.5  --  2.1*  --   --   --    < > = values in this interval not displayed.    CBC Recent Labs  Lab 05/20/17 0318 05/21/17 0428 05/22/17 0345  WBC 9.9 10.1 7.9  HGB 14.8 12.9* 11.8*  HCT 42.3 38.1* 34.4*  PLT 152 140* 132*    Coag's Recent Labs  Lab 05/19/17 0430 05/19/17 1224  APTT 91* 32  INR 1.03 0.98    Sepsis Markers Recent Labs  Lab 05/19/17 0156 05/19/17 0650  LATICACIDVEN 7.86* 5.0*    ABG Recent Labs  Lab 05/19/17 0528 05/19/17 0817 05/20/17 0506  PHART 7.271* 7.242* 7.494*  PCO2ART 36.2 39.9 27.4*  PO2ART 193.0* 156.0* 221.0*    Liver Enzymes Recent Labs  Lab 05/19/17 0154 05/20/17 0318  AST 51*  --   ALT 24  --   ALKPHOS 66  --   BILITOT 0.8  --   ALBUMIN 3.3* 3.0*    Cardiac Enzymes Recent Labs  Lab 05/19/17 0430 05/19/17 0650 05/19/17 1552  TROPONINI 2.32*  12.38* 25.35*    Glucose Recent Labs  Lab 05/22/17 1512 05/22/17 2011 05/22/17 2314 05/23/17 0418 05/23/17 0819 05/23/17 1136  GLUCAP 81 97 81 76 132* 132*    Imaging No results found.   STUDIES:  TTE 07/2015 >> Mild LVH with LVEF 60-65%. Grade 1 diastolic dysfunction with normal estimated LV filling pressure. Mildly thickened mitral leaflets with trivial mitral regurgitation. Trivial tricuspidregurgitation, unable to estimate PASP. LHC 05/19/2017 >> moderate LAD/Diagonal disease. No intervention done  ECHO 1/2 >> moderate concentric hypertrophy, LVEF 20-25% (prior 07/2015 60-65%), diffuse hypokinesis, grade 2 diastolic dysfunction, PA peak 39 mm Hg  CULTURES: none  ANTIBIOTICS: none  SIGNIFICANT EVENTS: 1/01  Admit after cardiac arrest at home, LHC.  Hypothermia  1/04  Extubated    LINES/TUBES: PIV x 3 ETT 1/1 >> 1/4  OGT 1/1>> 1/4 Foley 1/1 >> 1/4   DISCUSSION: 72 yoM w/PMH of LVH and HTN with vfib cardiac arrest from home.  No bystander cpr. Did require sedation, but unknown if movement was purposeful.  Taken for emergency LHC with minimal nonobstructive disease, EF 35%.  Vfib arrest while transferring from cath table to stretcher and additionally on arrival to ICU.  On normothermia protocol with shivering so paralyzed. Rewarmed. Hypertensive but hemodynamically stable.   Extubated 1/4.    ASSESSMENT / PLAN:  PULMONARY A: Acute respiratory failure in the setting of cardiac arrest Tobacco abuse  P:   Pulmonary hygiene - IS, mobilize  Follow intermittent CXR  Smoking cessation counseling   CARDIOVASCULAR A:  Vfib cardiac arrest - in setting of NSTEMI, CAD, cardiomyopathy  - ROSC after 18 min if not more w/no bystander CPR, emergent LHC on admit - no intervention, 40% at LAD, EF 35% Acute systolic HF Prolonged QT P:  Appreciate Cardiology assistance  Discontinue IV amiodarone Transition to 400 mg IV Q12 PO for now Resume home norvasc  Hold lisinopril  Continue catapres Change lopressor to PO from IV Caution with QT prolongation   RENAL A:   Hypokalemia Lactate acidosis At risk AKI P:   Follow BMP, urine output Replace electrolytes as indicated  GASTROINTESTINAL A:   Nutrition  P:   Diet as tolerated Discontinue PPI   NEUROLOGIC A:  Acute encephalopathy - concern for anoxic injury +/- toxic/metabolic. ETOH  205 P:   PT efforts - mobilize, out of bed    FAMILY  - Updates:  Sister, Wife updated at bedside.    - Global:  Tx out of ICU to medical tele, to [facility] as of 1/6 am.   Canary Brim, NP-C [facility] Pgr: [phone] or if no answer [phone] 05/23/2017, 1:27 PM

## 2017-05-23 NOTE — Progress Notes (Signed)
Progress Note  Patient Name: Tony Reynolds Date of Encounter: 05/23/2017  Primary Cardiologist: [facility]  Primary Electrophysiologist: [id number]   Patient Profile     47 y.o. male with LVH who suffered cardiac arrest\ and recurrent VF following cath  Cath  LAD 50% D1 75  Subjective   Without chest pain or shortness of breath.  Inpatient Medications    Scheduled Meds: . amiodarone  400 mg Oral BID  . amLODipine  10 mg Oral Daily  . aspirin  81 mg Oral Daily  . chlorhexidine  15 mL Mouth Rinse BID  . cloNIDine  0.1 mg Transdermal Weekly  . folic acid  1 mg Intravenous Daily  . heparin  5,000 Units Subcutaneous Q8H  . mouth rinse  15 mL Mouth Rinse q12n4p  . metoprolol tartrate  12.5 mg Oral BID  . thiamine injection  100 mg Intravenous Daily   Continuous Infusions: . sodium chloride Stopped (05/23/17 0130)   PRN Meds: acetaminophen, hydrALAZINE, Influenza vac split quadrivalent PF, pneumococcal 23 valent vaccine   Vital Signs    Vitals:   05/23/17 1000 05/23/17 1107 05/23/17 1138 05/23/17 1200  BP: (!) 146/78 (!) 169/93  (!) 165/90  Pulse: 72 76  71  Resp: (!) 22 19  (!) 24  Temp:   98.7 F (37.1 C)   TempSrc:   Oral   SpO2: 95% 94%  (!) 89%  Weight:      Height:        Intake/Output Summary (Last 24 hours) at 05/23/2017 1517 Last data filed at 05/23/2017 0700 Gross per 24 hour  Intake 802.6 ml  Output 1650 ml  Net -847.4 ml   Filed Weights   05/21/17 0500 05/22/17 0500 05/23/17 0700  Weight: 165 lb 5.5 oz (75 kg) 162 lb 14.7 oz (73.9 kg) 160 lb 0.9 oz (72.6 kg)    Telemetry    Sinus now.- Personally Reviewed Review of the tachycardias around the time of catheterization interestingly demonstrated a r uniformly present t monomorphic PVC which is triggering the polymorphic ventricular tachycardia  ECG      Personally Reviewed  Physical Exam  Well developed and nourished in no acute distress HENT normal Neck supple with JVP-flat Clear Regular rate and  rhythm, no murmurs or gallops Abd-soft with active BS No Clubbing cyanosis edema Skin-warm and dry A & Oriented  Grossly normal sensory and motor function   Labs    Chemistry Recent Labs  Lab 05/19/17 0154  05/20/17 0318 05/21/17 0428 05/22/17 0345 05/23/17 0323  NA 139   < > 136 138 138 139  K 2.8*   < > 4.6 3.8 3.4* 3.2*  CL 109   < > 109 107 104 104  CO2 13*   < > 19* 24 27 24   GLUCOSE 180*   < > 112* 93 96 86  BUN 9   < > 13 14 16 14   CREATININE 1.12   < > 0.79 0.93 1.00 1.03  CALCIUM 7.6*   < > 7.6* 7.9* 7.9* 8.3*  PROT 6.3*  --   --   --   --   --   ALBUMIN 3.3*  --  3.0*  --   --   --   AST 51*  --   --   --   --   --   ALT 24  --   --   --   --   --   ALKPHOS 66  --   --   --   --   --  BILITOT 0.8  --   --   --   --   --   GFRNONAA >60   < > >60 >60 >60 >60  GFRAA >60   < > >60 >60 >60 >60  ANIONGAP 17*   < > 8 7 7 11    < > = values in this interval not displayed.     Hematology Recent Labs  Lab 05/20/17 0318 05/21/17 0428 05/22/17 0345  WBC 9.9 10.1 7.9  RBC 4.91 4.45 3.96*  HGB 14.8 12.9* 11.8*  HCT 42.3 38.1* 34.4*  MCV 86.2 85.6 86.9  MCH 30.1 29.0 29.8  MCHC 35.0 33.9 34.3  RDW 14.3 14.3 13.8  PLT 152 140* 132*    Cardiac Enzymes Recent Labs  Lab 05/19/17 0154 05/19/17 0430 05/19/17 0650 05/19/17 1552  TROPONINI 0.17* 2.32* 12.38* 25.35*    Recent Labs  Lab 05/19/17 0150  TROPIPOC 0.10*     BNPNo results for input(s): BNP, PROBNP in the last 168 hours.   DDimer No results for input(s): DDIMER in the last 168 hours.   Radiology    No results found.  Cardiac Studies     Device Interrogation        Assessment & Plan    Cardiac arrest  Ventricular tachycardia  Cardiomyopathy-mild  Skin lesions-hives  We will see the patient in full Evaluation tomorrow will need an ICD of medical therapy.  Question of implantation timing however is begged by his skin lesions.  I would rather use a implantable defibrillator after  full healing and a LifeVest in the interim Signed, Sherryl Manges, MD  05/23/2017, 3:17 PM

## 2017-05-23 NOTE — Progress Notes (Addendum)
Pt refusing to use call light or request assistance, in spite of being educated on safe ambulation, how to use call light, and being encouraged to ask for help with ambulation. Family at bedside, also educated on safe ambulation and encouraged to use call light when assistance is needed. Family states understanding.

## 2017-05-23 NOTE — Progress Notes (Signed)
Addendum to prior note > RLE swelling noted, assess venous duplex.   Canary Brim, NP-C [facility] Pgr: [phone] or if no answer [phone] 05/23/2017, 2:31 PM

## 2017-05-23 NOTE — Progress Notes (Signed)
[facility] ADULT ICU REPLACEMENT PROTOCOL FOR AM LAB REPLACEMENT ONLY  The patient does apply for the [facility] Adult ICU Electrolyte Replacment Protocol based on the criteria listed below:   1. Is GFR >/= 40 ml/min? Yes.    Patient's GFR today is >60 2. Is urine output >/= 0.5 ml/kg/hr for the last 6 hours? Yes.   Patient's UOP is 0.7 ml/kg/hr 3. Is BUN < 60 mg/dL? Yes.    Patient's BUN today is 14 4. Abnormal electrolyte(s): K was 3.2 5. Ordered repletion with: per protocol 6. If a panic level lab has been reported, has the CCM MD in charge been notified? Yes.  .   Physician:  Dr Hart Rochester, Dixon Boos 05/23/2017 5:11 AM

## 2017-05-24 ENCOUNTER — Inpatient Hospital Stay (HOSPITAL_COMMUNITY): Payer: BLUE CROSS/BLUE SHIELD

## 2017-05-24 DIAGNOSIS — I493 Ventricular premature depolarization: Secondary | ICD-10-CM

## 2017-05-24 DIAGNOSIS — L03119 Cellulitis of unspecified part of limb: Secondary | ICD-10-CM

## 2017-05-24 DIAGNOSIS — M79609 Pain in unspecified limb: Secondary | ICD-10-CM

## 2017-05-24 DIAGNOSIS — M7989 Other specified soft tissue disorders: Secondary | ICD-10-CM

## 2017-05-24 LAB — BASIC METABOLIC PANEL
Anion gap: 7 (ref 5–15)
BUN: 9 mg/dL (ref 6–20)
CHLORIDE: 105 mmol/L (ref 101–111)
CO2: 25 mmol/L (ref 22–32)
Calcium: 8.9 mg/dL (ref 8.9–10.3)
Creatinine, Ser: 0.94 mg/dL (ref 0.61–1.24)
GFR calc Af Amer: >60 mL/min (ref >60)
GFR calc non Af Amer: >60 mL/min (ref >60)
GLUCOSE: 113 mg/dL — AB (ref 65–99)
POTASSIUM: 3.6 mmol/L (ref 3.5–5.1)
Sodium: 137 mmol/L (ref 135–145)

## 2017-05-24 LAB — CBC
HEMATOCRIT: 39.3 % (ref 39.0–52.0)
Hemoglobin: 13.6 g/dL (ref 13.0–17.0)
MCH: 29.6 pg (ref 26.0–34.0)
MCHC: 34.6 g/dL (ref 30.0–36.0)
MCV: 85.6 fL (ref 78.0–100.0)
Platelets: 159 10*3/uL (ref 150–400)
RBC: 4.59 MIL/uL (ref 4.22–5.81)
RDW: 13.4 % (ref 11.5–15.5)
WBC: 8.2 10*3/uL (ref 4.0–10.5)

## 2017-05-24 LAB — MAGNESIUM: MAGNESIUM: 2.2 mg/dL (ref 1.7–2.4)

## 2017-05-24 MED ORDER — WHITE PETROLATUM EX OINT
TOPICAL_OINTMENT | CUTANEOUS | Status: AC
  Administered 2017-05-24: 16:00:00
  Filled 2017-05-24: qty 28.35

## 2017-05-24 MED ORDER — LORAZEPAM 2 MG/ML IJ SOLN
INTRAMUSCULAR | Status: AC
  Filled 2017-05-24: qty 1

## 2017-05-24 MED ORDER — LORAZEPAM 2 MG/ML IJ SOLN
2.0000 mg | Freq: Once | INTRAMUSCULAR | Status: AC
  Administered 2017-05-24: 2 mg via INTRAVENOUS

## 2017-05-24 MED ORDER — HALOPERIDOL LACTATE 5 MG/ML IJ SOLN
INTRAMUSCULAR | Status: AC
  Filled 2017-05-24: qty 1

## 2017-05-24 MED ORDER — HALOPERIDOL LACTATE 5 MG/ML IJ SOLN: 5.0000 mg | Freq: Once | INTRAMUSCULAR | Status: DC

## 2017-05-24 MED ORDER — LORAZEPAM 2 MG/ML IJ SOLN
1.0000 mg | INTRAMUSCULAR | Status: DC | PRN
  Administered 2017-05-24 – 2017-05-26 (×6): 2 mg via INTRAVENOUS
  Filled 2017-05-24 (×6): qty 1

## 2017-05-24 MED ORDER — HALOPERIDOL LACTATE 5 MG/ML IJ SOLN: 2.0000 mg | Freq: Once | INTRAMUSCULAR | Status: DC

## 2017-05-24 MED ORDER — HYDROCODONE-ACETAMINOPHEN 5-325 MG PO TABS
1.0000 | ORAL_TABLET | Freq: Once | ORAL | Status: AC
  Administered 2017-05-24: 1 via ORAL
  Filled 2017-05-24: qty 1

## 2017-05-24 NOTE — Progress Notes (Signed)
PT Cancellation Note  Patient Details Name: Tony Reynolds MRN: [id number] DOB: [birth year]   Cancelled Treatment:    Reason Eval/Treat Not Completed: Other (comment). Pt very agitated earlier with security called. Will continue attempts at later date.   Angelina Ok [place] 05/24/2017, 2:10 PM [employer] PT [phone]

## 2017-05-24 NOTE — Progress Notes (Signed)
Progress Note  Patient Name: Tony Reynolds Date of Encounter: 05/24/2017  Primary Cardiologist: [facility]  Primary Electrophysiologist: [id number]   Patient Profile     47 y.o. male with LVH who suffered cardiac arrest\ and recurrent VF following cath  Cath  LAD 50% D1 75  Subjective   Events noted -- and efforts for keeping his from leaving Now asleep and not arousing to stimulation received haldol and ativan    Inpatient Medications    Scheduled Meds: . haloperidol lactate      . haloperidol lactate      . LORazepam      . white petrolatum      . amiodarone  400 mg Oral BID  . amLODipine  10 mg Oral Daily  . aspirin  81 mg Oral Daily  . chlorhexidine  15 mL Mouth Rinse BID  . cloNIDine  0.1 mg Transdermal Weekly  . folic acid  1 mg Intravenous Daily  . haloperidol lactate  5 mg Intravenous Once  . heparin  5,000 Units Subcutaneous Q8H  . mouth rinse  15 mL Mouth Rinse q12n4p  . metoprolol tartrate  12.5 mg Oral BID  . nicotine  7 mg Transdermal Daily  . thiamine injection  100 mg Intravenous Daily   Continuous Infusions: . sodium chloride Stopped (05/23/17 0130)   PRN Meds: acetaminophen, hydrALAZINE, Influenza vac split quadrivalent PF, LORazepam, pneumococcal 23 valent vaccine   Vital Signs    Vitals:   05/24/17 0500 05/24/17 0505 05/24/17 0800 05/24/17 1100  BP:  (!) 153/96  134/75  Pulse:      Resp:  16    Temp:   99 F (37.2 C)   TempSrc:   Oral   SpO2:  98%    Weight: 158 lb 11.7 oz (72 kg)     Height:       No intake or output data in the 24 hours ending 05/24/17 1151 Filed Weights   05/22/17 0500 05/23/17 0700 05/24/17 0500  Weight: 162 lb 14.7 oz (73.9 kg) 160 lb 0.9 oz (72.6 kg) 158 lb 11.7 oz (72 kg)    Telemetry      Personally Reviewed normal sinus rhythm  Review of the tachycardias around the time of catheterization interestingly demonstrated a r uniformly present t monomorphic PVC which is triggering the polymorphic ventricular  tachycardia  ECG  03/01/17 Sinus with normal QT interval   1 jan  Sinus with LBBB  Infer axis morphology QT 380  Sinus  LVH repol 15/10/55  Physical Exam  Well developed and nourished in no acute distress somnolent and not arousable HENT normal Neck supple with JVP-flat Clear Regular rate and rhythm, no murmurs or gallops Abd-soft with active BS No Clubbing cyanosis edema Skin-warm and dry multiple skin lesions ? cause    Grossly normal sensory and motor function   Labs    Chemistry Recent Labs  Lab 05/19/17 0154  05/20/17 0318  05/22/17 0345 05/23/17 0323 05/24/17 0314  NA 139   < > 136   < > 138 139 137  K 2.8*   < > 4.6   < > 3.4* 3.2* 3.6  CL 109   < > 109   < > 104 104 105  CO2 13*   < > 19*   < > 27 24 25   GLUCOSE 180*   < > 112*   < > 96 86 113*  BUN 9   < > 13   < > 16  14 9  CREATININE 1.12   < > 0.79   < > 1.00 1.03 0.94  CALCIUM 7.6*   < > 7.6*   < > 7.9* 8.3* 8.9  PROT 6.3*  --   --   --   --   --   --   ALBUMIN 3.3*  --  3.0*  --   --   --   --   AST 51*  --   --   --   --   --   --   ALT 24  --   --   --   --   --   --   ALKPHOS 66  --   --   --   --   --   --   BILITOT 0.8  --   --   --   --   --   --   GFRNONAA >60   < > >60   < > >60 >60 >60  GFRAA >60   < > >60   < > >60 >60 >60  ANIONGAP 17*   < > 8   < > 7 11 7    < > = values in this interval not displayed.     Hematology Recent Labs  Lab 05/21/17 0428 05/22/17 0345 05/24/17 0314  WBC 10.1 7.9 8.2  RBC 4.45 3.96* 4.59  HGB 12.9* 11.8* 13.6  HCT 38.1* 34.4* 39.3  MCV 85.6 86.9 85.6  MCH 29.0 29.8 29.6  MCHC 33.9 34.3 34.6  RDW 14.3 13.8 13.4  PLT 140* 132* 159    Cardiac Enzymes Recent Labs  Lab 05/19/17 0154 05/19/17 0430 05/19/17 0650 05/19/17 1552  TROPONINI 0.17* 2.32* 12.38* 25.35*    Recent Labs  Lab 05/19/17 0150  TROPIPOC 0.10*     BNPNo results for input(s): BNP, PROBNP in the last 168 hours.   DDimer No results for input(s): DDIMER in the last 168 hours.    Radiology    No results found.  Cardiac Studies     Device Interrogation        Assessment & Plan    Cardiac arrest  Ventricular tachycardia  Cardiomyopathy-mild  Skin lesions-hives  Agitation   QTc prolongation   Amiodarone therapy   Amio likely cause of QT prolongation as not evident on prior tracings--further, the initiation of VT was NOT pause dependent and did NOT represent torsades, but was triggered by the PVCs  Continue amio   No evidence of ARVC, and would like to suggest MRI scan if possible prior to ICD implantation  Would favor with skin lesions--open that we defer ICD therapy until heled and would use lifevest in the interim  This may also allow for further healing of encephalopathy and make MRI scanning more feasible   Complex decision making  Spoke with CCM<    Signed, Sherryl Manges, MD  05/24/2017, 11:51 AM

## 2017-05-24 NOTE — Progress Notes (Signed)
VASCULAR LAB PRELIMINARY  PRELIMINARY  PRELIMINARY  PRELIMINARY  Right lower extremity venous duplex completed.    Preliminary report:  There is no DVT or SVT noted in the right lower extremity.  Enlarged inguinal lymph node noted.   Hamda Klutts, RVT 05/24/2017, 12:01 PM

## 2017-05-24 NOTE — Progress Notes (Signed)
  Speech Language Pathology Treatment: Dysphagia  Patient Details Name: Tony Reynolds MRN: [id number] DOB: 01-02-1971 Today's Date: 05/24/2017 Time: 1600-1610 SLP Time Calculation (min) (ACUTE ONLY): 10 min  Assessment / Plan / Recommendation Clinical Impression  Patient seen for follow-up for dysphagia. Per RN pt has been involuntarily committed after attempting to leave AMA; current temp 100.1, R/L rhonchi noted in review of vitals. In restraints with nurse tech, family present. Pt's sister reports pt having difficulty with his saliva; significant anterior loss of secretions noted. Pt initially refusing to participate, though with max cues he does initiate swallow for secretion management. Pt took single sip of thin liquid, which he held orally for several seconds. Suspect delayed swallow initiation. Delayed cough, wet vocal quality, concerning for decreased airway protection. Subsequent anterior loss of secretions noted. Pt refusing other consistencies. Education provided to pt and family re: risks of aspiration given his decreased management of secretions, current mentation. Recommend NPO, pending further assessment. Can give necessary oral medications crushed in puree if pt will accept. Will follow up next date. Cognitive linguistic evaluation also recommended.         HPI HPI: Patient is a 47 year old male with past medical history of hypertension, current smoker, LVH presenting with cardiac arrest from home. He had no bystander CPR per accounts from the chart and family initial rhythm V. fib duration of code 18 minutes patient intubated EKG showing ST-T wave changes cardiology consulted taken to Cath Lab. Catheterization showed minimal occlusive disease nothing requiring intervention. However, patient had another V. fib episode on cath table and received defibrillation x1. Upon arrival to to heart patient continued to have bigeminy trigeminy required amiodarone bolus followed by amio Infusion  followed by mag followed by calcium followed by lidocaine infusion.      SLP Plan  Continue with current plan of care       Recommendations  Diet recommendations: NPO Medication Administration: Via alternative means                Oral Care Recommendations: Oral care QID Follow up Recommendations: Inpatient Rehab SLP Visit Diagnosis: Dysphagia, unspecified (R13.10) Plan: Continue with current plan of care       GO              Tony Baton, MS, CCC-SLP Speech-Language Pathologist [phone]  Tony Reynolds 05/24/2017, 4:16 PM

## 2017-05-24 NOTE — Progress Notes (Signed)
PROGRESS NOTE    Tony Reynolds  QBH:[id number] DOB: 01-Dec-1970 DOA: 05/19/2017 PCP: Benita Stabile, MD  Outpatient Specialists:   Brief Narrative: Patient is a 47 year old African American male with past medical history significant for hypertension, current smoker, and LVH. Patient was admitted with cardiac arrest from home. Patient was initially intubated and admitted to ICU Team. Initial EKG showed ST-T wave changes, Cardiology was consulted and patient underwent cardiac catheterization.  Catheterization showed minimal occlusive disease, but nothing requiring intervention. Patient was reported to have had another V. fib episode on cath table and received defibrillation x 1.  Post Cath, patient continued to have bigeminy and trigeminy that required amiodarone bolus followed by amio Infusion followed by mag followed by calcium followed by lidocaine infusion.   Patient has been followed up by the EP team, and life vest and eventually AICD placement (pending resolution of the current skin problem) is planned. Patient has posed management challenges as he has been trying to leave the hospital. Patient was transferred to the Hospitalist service today.  Patient seen alongside patient's wife and sister. I discussed need for patient to remain inpatient for now. Patient lacks insight, and lacks capacity to make medical decisions as of today. Patient meets criteria for involuntary commitment if patient tries to leave the hospital. Discussed with the Nursing staff, and I was informed the process is already in place, and I didn't any more to do in that regard.  No new complaints. No chest pain, no fever or chills, no SOB.  Assessment & Plan:   Active Problems:   Cardiac arrest [facility])   Acute myocardial infarction (HCC)   Endotracheally intubated Hypertension Management challenge  Continue management as per EP team Possible Life Vest and AICD placement (eventually) Monitor and replete  electrolytes Optimize BP Involuntary commitment is appropriate   DVT prophylaxis: [place] lovenox Code Status: Full Family Communication: Wife and Sister Disposition Plan: Home eventually   Consultants:   Electrophysiology team  Procedures:   Cardiac cath.  Possible life vest and AICD placement broached  Antimicrobials:   None    Subjective: No new complaints. Lacks insight. Tried to leave the hospital earlier.  Objective: Vitals:   05/24/17 0500 05/24/17 0505 05/24/17 0800 05/24/17 1100  BP:  (!) 153/96  134/75  Pulse:      Resp:  16  (!) 21  Temp:   99 F (37.2 C)   TempSrc:   Oral   SpO2:  98%  99%  Weight: 72 kg (158 lb 11.7 oz)     Height:        Intake/Output Summary (Last 24 hours) at 05/24/2017 1503 Last data filed at 05/24/2017 1200 Gross per 24 hour  Intake 240 ml  Output -  Net 240 ml   Filed Weights   05/22/17 0500 05/23/17 0700 05/24/17 0500  Weight: 73.9 kg (162 lb 14.7 oz) 72.6 kg (160 lb 0.9 oz) 72 kg (158 lb 11.7 oz)    Examination:  General exam: Appears calm and comfortable  Respiratory system: Clear to auscultation. Respiratory effort normal. Cardiovascular system: S1 & S2 heard Gastrointestinal system: Abdomen is nondistended, soft and nontender. No organomegaly or masses felt. Normal bowel sounds heard. Central nervous system: Awake and Alert. Patient moves all limbs. Patient lacks insight.   Extremities: No leg edema. Bullae lower extremities. Skin: See above. Psychiatry: Darylene Price capacity to make medical decision as of today.   Data Reviewed: I have personally reviewed following labs and imaging studies  CBC:  Recent Labs  Lab 05/19/17 0154  05/19/17 0430  05/19/17 0840 05/20/17 0318 05/21/17 0428 05/22/17 0345 05/24/17 0314  WBC 5.1  --  12.3*  --   --  9.9 10.1 7.9 8.2  NEUTROABS 1.6*  --   --   --   --   --   --   --   --   HGB 14.8   < > 16.0   < > 16.3 14.8 12.9* 11.8* 13.6  HCT 42.8   < > 45.4   < > 48.0 42.3 38.1*  34.4* 39.3  MCV 87.5  --  87.8  --   --  86.2 85.6 86.9 85.6  PLT 230  --  256  --   --  152 140* 132* 159   < > = values in this interval not displayed.   Basic Metabolic Panel: Recent Labs  Lab 05/19/17 0650  05/20/17 0318 05/21/17 0428 05/22/17 0345 05/23/17 0323 05/24/17 0314  NA  --    < > 136 138 138 139 137  K  --    < > 4.6 3.8 3.4* 3.2* 3.6  CL  --    < > 109 107 104 104 105  CO2  --    < > 19* 24 27 24 25   GLUCOSE  --    < > 112* 93 96 86 113*  BUN  --    < > 13 14 16 14 9   CREATININE  --    < > 0.79 0.93 1.00 1.03 0.94  CALCIUM  --    < > 7.6* 7.9* 7.9* 8.3* 8.9  MG  --    < > 2.1  2.1 2.0 2.3 2.4 2.2  PHOS 3.5  --  2.1*  --   --   --   --    < > = values in this interval not displayed.   GFR: Estimated Creatinine Clearance: 102.1 mL/min (by C-G formula based on SCr of 0.94 mg/dL). Liver Function Tests: Recent Labs  Lab 05/19/17 0154 05/20/17 0318  AST 51*  --   ALT 24  --   ALKPHOS 66  --   BILITOT 0.8  --   PROT 6.3*  --   ALBUMIN 3.3* 3.0*   No results for input(s): LIPASE, AMYLASE in the last 168 hours. No results for input(s): AMMONIA in the last 168 hours. Coagulation Profile: Recent Labs  Lab 05/19/17 0430 05/19/17 1224  INR 1.03 0.98   Cardiac Enzymes: Recent Labs  Lab 05/19/17 0154 05/19/17 0430 05/19/17 0650 05/19/17 1552  TROPONINI 0.17* 2.32* 12.38* 25.35*   BNP (last 3 results) No results for input(s): PROBNP in the last 8760 hours. HbA1C: No results for input(s): HGBA1C in the last 72 hours. CBG: Recent Labs  Lab 05/22/17 2011 05/22/17 2314 05/23/17 0418 05/23/17 0819 05/23/17 1136  GLUCAP 97 81 76 132* 132*   Lipid Profile: No results for input(s): CHOL, HDL, LDLCALC, TRIG, CHOLHDL, LDLDIRECT in the last 72 hours. Thyroid Function Tests: No results for input(s): TSH, T4TOTAL, FREET4, T3FREE, THYROIDAB in the last 72 hours. Anemia Panel: No results for input(s): VITAMINB12, FOLATE, FERRITIN, TIBC, IRON, RETICCTPCT  in the last 72 hours. Urine analysis:    Component Value Date/Time   COLORURINE YELLOW 05/19/2017 0343   APPEARANCEUR CLOUDY (A) 05/19/2017 0343   LABSPEC 1.039 (H) 05/19/2017 0343   PHURINE 5.0 05/19/2017 0343   GLUCOSEU 50 (A) 05/19/2017 0343   HGBUR MODERATE (A) 05/19/2017 0343   BILIRUBINUR NEGATIVE  05/19/2017 0343   KETONESUR NEGATIVE 05/19/2017 0343   PROTEINUR 100 (A) 05/19/2017 0343   UROBILINOGEN 0.2 02/16/2014 0820   NITRITE NEGATIVE 05/19/2017 0343   LEUKOCYTESUR NEGATIVE 05/19/2017 0343   Sepsis Labs: @LABRCNTIP (procalcitonin:4,lacticidven:4)  ) Recent Results (from the past 240 hour(s))  MRSA PCR Screening     Status: None   Collection Time: 05/19/17  3:38 AM  Result Value Ref Range Status   MRSA by PCR NEGATIVE NEGATIVE Final    Comment:        The GeneXpert MRSA Assay (FDA approved for NASAL specimens only), is one component of a comprehensive MRSA colonization surveillance program. It is not intended to diagnose MRSA infection nor to guide or monitor treatment for MRSA infections.          Radiology Studies: No results found.      Scheduled Meds: . haloperidol lactate      . haloperidol lactate      . LORazepam      . white petrolatum      . amiodarone  400 mg Oral BID  . amLODipine  10 mg Oral Daily  . aspirin  81 mg Oral Daily  . chlorhexidine  15 mL Mouth Rinse BID  . cloNIDine  0.1 mg Transdermal Weekly  . folic acid  1 mg Intravenous Daily  . haloperidol lactate  5 mg Intravenous Once  . heparin  5,000 Units Subcutaneous Q8H  . mouth rinse  15 mL Mouth Rinse q12n4p  . metoprolol tartrate  12.5 mg Oral BID  . nicotine  7 mg Transdermal Daily  . thiamine injection  100 mg Intravenous Daily   Continuous Infusions: . sodium chloride Stopped (05/23/17 0130)     LOS: 5 days    Time spent: 45 Minutes.    Berton Mount, MD  [facility] Pager #: [phone] 7PM-7AM contact night coverage as above

## 2017-05-24 NOTE — Progress Notes (Signed)
Writer notified by Licensed conveyancer, at approximately 0830, that pt was out of bed, standing at bedside, attempting to ambulate without supervision of hospital personnel. Writer entered room to find pt at bedside, using standard walker for support, arguing with wife. Writer attempted multiple times to redirect pt, who was stating that he was going to leave the hospital and "didn't care" about the risk to his safety. Writer continued to communicate with pt, requesting that he wait until a physician could speak with him. Magazine features editor to make them aware of the situation. Writer was instructed to call unit secretary to request security presence.Charge RN arrived to room and Arts administrator to exit room to call physician. Writer did as instructed.  Security arrived to room, pt exited room into hallway with Consulting civil engineer. Security asked if patient was involuntarily committed, to which Consulting civil engineer stated pt was not. Security requested that pt allow for peripheral IV to be removed prior to pt leaving hospital. Pt agreed.  Nurse practitioner arrived and updated. Writer instructed to withdraw Ativan 2mg  IV and Haldol 5mg  IV. Writer did as instructed.  Writer returned from [employer] to find pt, Office manager, Consulting civil engineer, and several other RNs down [employer], near front nurses' station. Writer instructed to leave vicinity, as pt was agitated by [employer]. Writer handed two (2) syringes, one (1) 65mL syringe of Ativan 2mg  (2mg /10mL) IV diluted with 68mL normal saline, one (1) 20mL syringe of Haldol 5mg  (5mg /mL) IV, and followed instructions to leave vicinity.  Pt care transferred to Charge RN following report from Clinical research associate.

## 2017-05-24 NOTE — Progress Notes (Addendum)
Called to bedside for agitation.  Patient standing at door of room.  Security at bedside.  He is agitated / delirious.  Asked orientation questions and he is unable to answer.  The patient is up with the assistance of a walker.  He becomes more agitated with male staff / threatens physical violence.  Attempted to reorient the patient and get him to voluntarily go back into the room but he refused.  He insisted he is leaving to go fishing and that his son will come get him.  He is paranoid and easily agitated. Distraction techniques worked well (talked about his children, his truck & fishing).  Family at bedside.  Requested IVC paper work from social work (Dr. Vassie Loll will sign).  Security x2 assisted the patient back to bed.  2mg  IV ativan & 5mg  IV haldol given.  The patient calmed with medications but is not oriented.  Suspect this is agitated delirium post cardiac arrest.    Plan: Hold further haldol given QTc 576 PRN ativan for agitation  He has transfer orders out of ICU and this may prove to be most beneficial as it will be less stimulation.  His family has been updated on his status and understand that less stimulation will be better temporarily.   Defer further care to [facility] / Dr. Dartha Lodge.   Continue to monitor closely   Canary Brim, NP-C [facility] Pgr: [phone] or if no answer [phone] 05/24/2017, 9:33 AM

## 2017-05-25 DIAGNOSIS — L0390 Cellulitis, unspecified: Secondary | ICD-10-CM

## 2017-05-25 DIAGNOSIS — I429 Cardiomyopathy, unspecified: Secondary | ICD-10-CM

## 2017-05-25 LAB — MAGNESIUM: Magnesium: 2.2 mg/dL (ref 1.7–2.4)

## 2017-05-25 MED ORDER — CARVEDILOL 6.25 MG PO TABS
6.2500 mg | ORAL_TABLET | Freq: Two times a day (BID) | ORAL | Status: DC
  Administered 2017-05-25 – 2017-05-26 (×3): 6.25 mg via ORAL
  Filled 2017-05-25 (×3): qty 1

## 2017-05-25 MED ORDER — AMLODIPINE BESYLATE 2.5 MG PO TABS
2.5000 mg | ORAL_TABLET | Freq: Every day | ORAL | Status: DC
  Administered 2017-05-25 – 2017-05-28 (×4): 2.5 mg via ORAL
  Filled 2017-05-25 (×3): qty 1

## 2017-05-25 MED ORDER — LOSARTAN POTASSIUM 25 MG PO TABS
25.0000 mg | ORAL_TABLET | Freq: Every day | ORAL | Status: DC
  Administered 2017-05-26 – 2017-05-28 (×3): 25 mg via ORAL
  Filled 2017-05-25 (×3): qty 1

## 2017-05-25 MED ORDER — NICOTINE 21 MG/24HR TD PT24
21.0000 mg | MEDICATED_PATCH | Freq: Every day | TRANSDERMAL | Status: DC
  Administered 2017-05-25 – 2017-05-27 (×3): 21 mg via TRANSDERMAL
  Filled 2017-05-25 (×4): qty 1

## 2017-05-25 NOTE — Progress Notes (Signed)
  Speech Language Pathology Treatment: Dysphagia  Patient Details Name: Tony Reynolds MRN: [id number] DOB: 05/22/1970 Today's Date: 05/25/2017 Time: 1540-0867 SLP Time Calculation (min) (ACUTE ONLY): 12 min  Assessment / Plan / Recommendation Clinical Impression  Pt's mentation is more appropriate for PO intake today, although he does remain impulsive. He had one cough noted while consuming a particularly large bite of solid food, concerning for premature spillage into the pharynx/larynx. Recommend restarting POs with Dys 2 textures and thin liquids. Pt would benefit from supervision to slow his rate of intake and increase safety. SLP will continue to follow. Continue to recommend cognitive evaluation.   HPI HPI: Patient is a 47 year old male with past medical history of hypertension, current smoker, LVH presenting with cardiac arrest from home. He had no bystander CPR per accounts from the chart and family initial rhythm V. fib duration of code 18 minutes patient intubated EKG showing ST-T wave changes cardiology consulted taken to Cath Lab. Catheterization showed minimal occlusive disease nothing requiring intervention. However, patient had another V. fib episode on cath table and received defibrillation x1. Upon arrival to to heart patient continued to have bigeminy trigeminy required amiodarone bolus followed by amio Infusion followed by mag followed by calcium followed by lidocaine infusion.      SLP Plan  Continue with current plan of care       Recommendations  Diet recommendations: Dysphagia 2 (fine chop);Thin liquid Liquids provided via: Cup;Straw Medication Administration: Whole meds with puree Supervision: Staff to assist with self feeding;Full supervision/cueing for compensatory strategies Compensations: Minimize environmental distractions;Slow rate;Small sips/bites Postural Changes and/or Swallow Maneuvers: Seated upright 90 degrees                Oral Care  Recommendations: Oral care BID Follow up Recommendations: Inpatient Rehab SLP Visit Diagnosis: Dysphagia, unspecified (R13.10) Plan: Continue with current plan of care       GO                Maxcine Ham 05/25/2017, 11:23 AM  Maxcine Ham, M.A. CCC-SLP [phone]

## 2017-05-25 NOTE — Progress Notes (Signed)
PROGRESS NOTE  Tony Reynolds  ZOX:[id number] DOB: May 21, 1970 DOA: 05/19/2017 PCP: Benita Stabile, MD   Brief Narrative: Tony Reynolds is a 47 y.o. male with a history of HTN, tobacco use, and LVH who experienced cardiac arrest at home with at least 18 minutes of no CPR support. He was intubated and admitted to the ICU with urgent catheterization showing minimal occlusive disease. He experienced repeat VFib arrest on the cath table with ROSC after 1 shock. Arrhythmias continued with EP following. Plan was for ICD though bullous skin lesions on legs has caused delay, so pt will have lifevest applied. Therapy evaluation has occurred, recommending CIR who is consulted. The patient is suspected to have had anoxic brain injury and lacks capacity for medical decision making, therefore IVC was filed 1/6.   Assessment & Plan: Cardiac arrest: No CPR at scene, ROSC per EMS after at least 18 minutes.  - EP consulted, on amiodarone - LifeVest arranged and follow up arranged per EP.   Chronic systolic CHF, HTN: EF 20-25% by echo, 35-40% by cath.  - Antihypertensives per EP/cardiology. Have been decreasing norvasc. - Coreg, aspirin, clonidine  Anoxic brain injury: Pt is adamant to leave the hospital though does not seem to have capacity to make this decision at this time. IVC filed 1/6.  - Continue to monitor and treat supportively.  - Delirium precautions, haldol only if imminent danger to self or others given QT prolongation.   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 Uncertain etiology. Not typical photosensitivity reaction to amiodarone. Not typical for allergic reaction systemically. No mucous membrane involvement.  - Keep ruptured bullae covered, no evidence of infection.  - Monitor. Consider further work up/dermatology referral if continues.   Tobacco use:  - Nicotine patch  DVT prophylaxis: Lovenox Code Status: Full Family Communication: Wife at bedside Disposition Plan: Therapy evaluations  ordered.   Consultants:   PCCM  Cardiology  EP  Procedures:  1/01  LHC.  Hypothermia. Intubated 1/1 - 1/4 OGT 1/1>> 1/4 Foley 1/1 >> 1/4   Antimicrobials:  None   Subjective: Blisters on right anterior leg have ruptured and one on left leg has ruptured though some still intact. Not itchy or painful, has had similar eruption once as a child.   Objective: Vitals:   05/25/17 1200 05/25/17 1300 05/25/17 1400 05/25/17 1500  BP: 134/88 (!) 150/98 (!) 146/130 (!) 147/98  Pulse: 61  68 75  Resp: (!) 21 (!) 21 (!) 23 (!) 26  Temp:  98 F (36.7 C)    TempSrc:  Oral    SpO2: 95% 96% 97% 94%  Weight:      Height:        Intake/Output Summary (Last 24 hours) at 05/25/2017 1602 Last data filed at 05/25/2017 1457 Gross per 24 hour  Intake 340 ml  Output 1302 ml  Net -962 ml   Filed Weights   05/23/17 0700 05/24/17 0500 05/25/17 0500  Weight: 72.6 kg (160 lb 0.9 oz) 72 kg (158 lb 11.7 oz) 69.5 kg (153 lb 3.5 oz)    Gen: 47 y.o. male in no distress  Pulm: Non-labored breathing. Clear to auscultation bilaterally.  CV: Regular rate and rhythm. No murmur, rub, or gallop. No JVD, trace pedal edema. GI: Abdomen soft, non-tender, non-distended, with normoactive bowel sounds. No organomegaly or masses felt. Ext: Warm, no deformities Skin: Nonerythematous bullae on left anterior shin and knee with unroofed blister w/foam dressing covering healthy wound. RLE also w/bullae covered by dressings.  No abscess or purulence or excoriations. Penis with irritated foreskin.  Neuro: Alert, requires frequent redirection. No focal neurological deficits. Psych: Judgement and insight appear impaired . Mood & affect appropriate.   Data Reviewed: I have personally reviewed following labs and imaging studies  CBC: Recent Labs  Lab 05/19/17 0154  05/19/17 0430  05/19/17 0840 05/20/17 0318 05/21/17 0428 05/22/17 0345 05/24/17 0314  WBC 5.1  --  12.3*  --   --  9.9 10.1 7.9 8.2  NEUTROABS 1.6*   --   --   --   --   --   --   --   --   HGB 14.8   < > 16.0   < > 16.3 14.8 12.9* 11.8* 13.6  HCT 42.8   < > 45.4   < > 48.0 42.3 38.1* 34.4* 39.3  MCV 87.5  --  87.8  --   --  86.2 85.6 86.9 85.6  PLT 230  --  256  --   --  152 140* 132* 159   < > = values in this interval not displayed.   Basic Metabolic Panel: Recent Labs  Lab 05/19/17 0650  05/20/17 0318 05/21/17 0428 05/22/17 0345 05/23/17 0323 05/24/17 0314 05/25/17 0258  NA  --    < > 136 138 138 139 137  --   K  --    < > 4.6 3.8 3.4* 3.2* 3.6  --   CL  --    < > 109 107 104 104 105  --   CO2  --    < > 19* 24 27 24 25   --   GLUCOSE  --    < > 112* 93 96 86 113*  --   BUN  --    < > 13 14 16 14 9   --   CREATININE  --    < > 0.79 0.93 1.00 1.03 0.94  --   CALCIUM  --    < > 7.6* 7.9* 7.9* 8.3* 8.9  --   MG  --    < > 2.1  2.1 2.0 2.3 2.4 2.2 2.2  PHOS 3.5  --  2.1*  --   --   --   --   --    < > = values in this interval not displayed.   GFR: Estimated Creatinine Clearance: 98.6 mL/min (by C-G formula based on SCr of 0.94 mg/dL). Liver Function Tests: Recent Labs  Lab 05/19/17 0154 05/20/17 0318  AST 51*  --   ALT 24  --   ALKPHOS 66  --   BILITOT 0.8  --   PROT 6.3*  --   ALBUMIN 3.3* 3.0*   No results for input(s): LIPASE, AMYLASE in the last 168 hours. No results for input(s): AMMONIA in the last 168 hours. Coagulation Profile: Recent Labs  Lab 05/19/17 0430 05/19/17 1224  INR 1.03 0.98   Cardiac Enzymes: Recent Labs  Lab 05/19/17 0154 05/19/17 0430 05/19/17 0650 05/19/17 1552  TROPONINI 0.17* 2.32* 12.38* 25.35*   BNP (last 3 results) No results for input(s): PROBNP in the last 8760 hours. HbA1C: No results for input(s): HGBA1C in the last 72 hours. CBG: Recent Labs  Lab 05/22/17 2011 05/22/17 2314 05/23/17 0418 05/23/17 0819 05/23/17 1136  GLUCAP 97 81 76 132* 132*   Lipid Profile: No results for input(s): CHOL, HDL, LDLCALC, TRIG, CHOLHDL, LDLDIRECT in the last 72  hours. Thyroid Function Tests: No results for input(s): TSH, T4TOTAL, FREET4,  T3FREE, THYROIDAB in the last 72 hours. Anemia Panel: No results for input(s): VITAMINB12, FOLATE, FERRITIN, TIBC, IRON, RETICCTPCT in the last 72 hours. Urine analysis:    Component Value Date/Time   COLORURINE YELLOW 05/19/2017 0343   APPEARANCEUR CLOUDY (A) 05/19/2017 0343   LABSPEC 1.039 (H) 05/19/2017 0343   PHURINE 5.0 05/19/2017 0343   GLUCOSEU 50 (A) 05/19/2017 0343   HGBUR MODERATE (A) 05/19/2017 0343   BILIRUBINUR NEGATIVE 05/19/2017 0343   KETONESUR NEGATIVE 05/19/2017 0343   PROTEINUR 100 (A) 05/19/2017 0343   UROBILINOGEN 0.2 02/16/2014 0820   NITRITE NEGATIVE 05/19/2017 0343   LEUKOCYTESUR NEGATIVE 05/19/2017 0343   Recent Results (from the past 240 hour(s))  MRSA PCR Screening     Status: None   Collection Time: 05/19/17  3:38 AM  Result Value Ref Range Status   MRSA by PCR NEGATIVE NEGATIVE Final    Comment:        The GeneXpert MRSA Assay (FDA approved for NASAL specimens only), is one component of a comprehensive MRSA colonization surveillance program. It is not intended to diagnose MRSA infection nor to guide or monitor treatment for MRSA infections.       Radiology Studies: No results found.  Scheduled Meds: . amiodarone  400 mg Oral BID  . amLODipine  2.5 mg Oral Daily  . aspirin  81 mg Oral Daily  . carvedilol  6.25 mg Oral BID WC  . chlorhexidine  15 mL Mouth Rinse BID  . cloNIDine  0.1 mg Transdermal Weekly  . folic acid  1 mg Intravenous Daily  . haloperidol lactate  5 mg Intravenous Once  . heparin  5,000 Units Subcutaneous Q8H  . [START ON 05/26/2017] losartan  25 mg Oral Daily  . mouth rinse  15 mL Mouth Rinse q12n4p  . nicotine  7 mg Transdermal Daily  . thiamine injection  100 mg Intravenous Daily   Continuous Infusions: . sodium chloride Stopped (05/23/17 0130)     LOS: 6 days   Time spent: 25 minutes.  Hazeline Junker, MD [facility] Pager  [phone]  If 7PM-7AM, please contact night-coverage [email or web address] Password [email or web address] 05/25/2017, 4:02 PM

## 2017-05-25 NOTE — Progress Notes (Signed)
Notified by Rosanne Ashing with [employer] [facility]est- pt has been approved for home Life Vest- [employer] will fit pt prior to discharge.

## 2017-05-25 NOTE — Evaluation (Signed)
Physical Therapy Evaluation Patient Details Name: Tony Reynolds MRN: [id number] DOB: July 13, 1970 Today's Date: 05/25/2017   History of Present Illness  Patient is a 47 year old African American male with past medical history significant for B THA, hypertension, current smoker, and LVH. Patient was admitted with cardiac arrest from home. Patient was initially intubated and admitted to ICU Team. Initial EKG showed ST-T wave changes, Cardiology was consulted and patient underwent cardiac catheterization.  Catheterization showed minimal occlusive disease, but nothing requiring intervention. Patient was reported to have had another V. fib episode on cath table and received defibrillation x 1.  Post Cath, patient continued to have bigeminy and trigeminy that required amiodarone bolus followed by amio Infusion followed by mag followed by calcium followed by lidocaine infusion.   Clinical Impression  Pt admitted with above diagnosis. Pt currently with functional limitations due to the deficits listed below (see PT Problem List). Pt with decreased attention, easily distracted in hallway environment, requiring min A for ambulation, though several LOB to each side with mod A to correct, +2 for assist and safety.  Pt will benefit from skilled PT to increase their independence and safety with mobility to allow discharge to the venue listed below.       Follow Up Recommendations CIR    Equipment Recommendations  Other (comment)(TBD)    Recommendations for Other Services Rehab consult     Precautions / Restrictions Precautions Precautions: Fall Precaution Comments: Unsteady and confused Restrictions Weight Bearing Restrictions: No      Mobility  Bed Mobility               General bed mobility comments: OOB in recliner on my arrival.   Transfers Overall transfer level: Needs assistance Equipment used: 2 person hand held assist Transfers: Sit to/from Stand Sit to Stand: Min assist;+2  physical assistance;+2 safety/equipment         General transfer comment: Min assist as pt with posterior LOB.   Ambulation/Gait Ambulation/Gait assistance: Min assist;Mod assist;+2 physical assistance;+2 safety/equipment Ambulation Distance (Feet): 450 Feet Assistive device: 2 person hand held assist Gait Pattern/deviations: Step-through pattern;Narrow base of support;Antalgic;Staggering left;Staggering right Gait velocity: decreased Gait velocity interpretation: Below normal speed for age/gender General Gait Details: pt with multiple LOB, especially when asked to process simultaneosly. Pt very distracted by items in environment. Min A 75% of time but lost balance to both sides multiple times with mod A to correct  Stairs            Wheelchair Mobility    Modified Rankin (Stroke Patients Only)       Balance Overall balance assessment: Needs assistance Sitting-balance support: No upper extremity supported;Feet supported Sitting balance-Leahy Scale: Good     Standing balance support: During functional activity;Single extremity supported Standing balance-Leahy Scale: Poor Standing balance comment: Up to mod assist to correct balance during dynamic tasks.                              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 Knee ext 4/5 compared to 5/5 on L.     Cervical / Trunk Assessment Cervical / Trunk Assessment: Normal  Communication   Communication: No difficulties  Cognition Arousal/Alertness: Awake/alert Behavior During Therapy: Impulsive Overall Cognitive Status: Impaired/Different from baseline Area of Impairment: Attention;Memory;Orientation;Following commands;Safety/judgement;Awareness;Problem solving                 Orientation Level: Disoriented to;Time;Situation Current Attention Level: Sustained Memory: Decreased short-term memory;Decreased recall of precautions Following Commands: Follows one step commands consistently;Follows one step commands with increased time Safety/Judgement: Decreased awareness of safety;Decreased awareness of deficits Awareness: Intellectual Problem Solving: Slow processing;Requires verbal cues General Comments: Pt reporting that it is 2099. Pt with decreased ability to attend to tasks in distracting hallway environment. Able to initiate cognitive tasks while ambulating but "falls off" accuracy after a few minutes. Unable to complete serial 7's.       General Comments General comments (skin integrity, edema, etc.): VSS throughout    Exercises     Assessment/Plan    PT Assessment Patient needs continued PT services  PT Problem List Decreased strength;Decreased activity tolerance;Decreased balance;Decreased mobility;Decreased cognition;Decreased coordination;Decreased knowledge of use of DME;Decreased safety awareness;Decreased knowledge of precautions;Pain       PT Treatment Interventions DME  instruction;Gait training;Stair training;Functional mobility training;Therapeutic activities;Therapeutic exercise;Balance training;Neuromuscular re-education;Cognitive remediation;Patient/family education    PT Goals (Current goals can be found in the Care Plan section)  Acute Rehab PT Goals Patient Stated Goal: go outside PT Goal Formulation: With patient Time For Goal Achievement: 06/08/17 Potential to Achieve Goals: Good    Frequency Min 3X/week   Barriers to discharge        Co-evaluation PT/OT/SLP Co-Evaluation/Treatment: Yes Reason for Co-Treatment: Complexity of the patient's impairments (multi-system involvement);For patient/therapist safety;Necessary to address cognition/behavior during functional activity PT goals addressed during session: Mobility/safety with mobility;Balance         AM-PAC PT "6 Clicks" Daily Activity  Outcome Measure Difficulty turning over in bed (including adjusting bedclothes, sheets and blankets)?: A Little Difficulty moving from lying on back to sitting on the side of the bed? : A Little Difficulty sitting down on and standing up from a chair with arms (e.g., wheelchair, bedside commode, etc,.)?: A Little Help needed moving to and from a bed to chair (including a wheelchair)?: A Little Help needed walking in hospital room?: A Little Help needed climbing 3-5 steps with a railing? : A Little 6 Click Score: 18    End of Session Equipment Utilized During Treatment: Gait belt Activity Tolerance: Patient tolerated treatment well Patient left: in chair;with call bell/phone within reach;with family/visitor present Nurse Communication: Mobility status PT Visit Diagnosis: Unsteadiness on feet (R26.81);Ataxic gait (R26.0);Difficulty in walking, not elsewhere classified (R26.2)    Time: 1610-9604 PT Time Calculation (min) (ACUTE ONLY): 34 min   Charges:   PT Evaluation $PT Eval Moderate Complexity: 1 Mod     PT G Codes:        Lyanne Co, PT  Acute Rehab Services  [phone]   Ione [place] L Doctor Sheahan 05/25/2017, 3:00 PM

## 2017-05-25 NOTE — Evaluation (Signed)
Occupational Therapy Evaluation Patient Details Name: Tony Reynolds MRN: [id number] DOB: March 11, 1971 Today's Date: 05/25/2017    History of Present Illness Patient is a 47 year old African American male with past medical history significant for B THA, hypertension, current smoker, and LVH. Patient was admitted with cardiac arrest from home. Patient was initially intubated and admitted to ICU Team. Initial EKG showed ST-T wave changes, Cardiology was consulted and patient underwent cardiac catheterization.  Catheterization showed minimal occlusive disease, but nothing requiring intervention. Patient was reported to have had another V. fib episode on cath table and received defibrillation x 1.  Post Cath, patient continued to have bigeminy and trigeminy that required amiodarone bolus followed by amio Infusion followed by mag followed by calcium followed by lidocaine infusion.    Clinical Impression   PTA, pt was independent with ADL and functional mobility. He currently presents to OT with decreased balance, decreased activity tolerance for ADL, poor attention and awareness impacting his ability to participate in ADL at [facility]. Pt requires up to mod assist for balance during toilet transfers, min assist for LB ADL, and supervision with cues for safety for seated UB ADL. Pt would benefit from continued OT services while admitted to improve functional participation in daily occupations. Recommend continued rehabilitation at CIR level to maximize return to independent PLOF. Will continue to follow.     Follow Up Recommendations  CIR;Supervision/Assistance - 24 hour    Equipment Recommendations  Other (comment)(TBD at next venue of care)    Recommendations for Other Services Rehab consult     Precautions / Restrictions Precautions Precautions: Fall Precaution Comments: Unsteady and confused Restrictions Weight Bearing Restrictions: No      Mobility Bed Mobility               General bed  mobility comments: (P) OOB in recliner on my arrival.   Transfers Overall transfer level: (P) Needs assistance Equipment used: (P) 2 person hand held assist Transfers: (P) Sit to/from Stand Sit to Stand: (P) Min assist;+2 physical assistance;+2 safety/equipment         General transfer comment: (P) Min assist as pt with posterior LOB.     Balance Overall balance assessment: (P) Needs assistance Sitting-balance support: (P) No upper extremity supported;Feet supported Sitting balance-Leahy Scale: (P) Good     Standing balance support: (P) During functional activity;Single extremity supported Standing balance-Leahy Scale: (P) Poor Standing balance comment: (P) Up to mod assist to correct balance during dynamic tasks.                            ADL either performed or assessed with clinical judgement   ADL Overall ADL's : Needs assistance/impaired Eating/Feeding: Supervision/ safety;Sitting   Grooming: Supervision/safety;Sitting   Upper Body Bathing: Sitting;Min guard   Lower Body Bathing: Minimal assistance;Sit to/from stand   Upper Body Dressing : Min guard;Sitting   Lower Body Dressing: Minimal assistance;Sit to/from stand   Toilet Transfer: +2 for physical assistance;Ambulation;Moderate assistance Toilet Transfer Details (indicate cue type and reason): Up to mod assist to correct multiple LOB Toileting- Clothing Manipulation and Hygiene: Minimal assistance;Sit to/from stand       Functional mobility during ADLs: Minimal assistance;+2 for safety/equipment;+2 for physical assistance General ADL Comments: Pt with multiple cognitive deficits and poor balance impacting his ability to participate in ADL.     Vision Baseline Vision/History: Wears glasses Wears Glasses: At all times Patient Visual Report: No change from baseline Additional Comments: Able  to read signs in hallway without glasses on and reports no change in vision. Cannot find glasses in his  room.      Perception     Praxis      Pertinent Vitals/Pain Pain Assessment: Faces Faces Pain Scale: Hurts little more Pain Location: RLE Pain Descriptors / Indicators: Discomfort Pain Intervention(s): Limited activity within patient's tolerance;Monitored during session     Hand Dominance Right   Extremity/Trunk Assessment Upper Extremity Assessment Upper Extremity Assessment: Defer to OT evaluation   Lower Extremity Assessment Lower Extremity Assessment: RLE deficits/detail RLE Deficits / Details: (P) has had hip pain since  B THA R>L, antalgic gait noted. Knee ext 4/5 compared to 5/5 on L.    Cervical / Trunk Assessment Cervical / Trunk Assessment: (P) Normal   Communication Communication Communication: No difficulties   Cognition Arousal/Alertness: Awake/alert Behavior During Therapy: Impulsive Overall Cognitive Status: Impaired/Different from baseline Area of Impairment: Attention;Memory;Orientation;Following commands;Safety/judgement;Awareness;Problem solving                 Orientation Level: Disoriented to;Time;Situation Current Attention Level: Sustained Memory: Decreased short-term memory;Decreased recall of precautions Following Commands: Follows one step commands consistently;Follows one step commands with increased time Safety/Judgement: Decreased awareness of safety;Decreased awareness of deficits Awareness: Intellectual Problem Solving: Slow processing;Requires verbal cues General Comments: Pt reporting that it is 2099. Pt with decreased ability to attend to tasks in distracting hallway environment. Able to initiate cognitive tasks while ambulating but "falls off" accuracy after a few minutes. Unable to complete serial 7's.    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 Problem List: Decreased strength;Decreased range of motion;Decreased activity tolerance;Impaired balance (sitting and/or standing);Decreased safety awareness;Decreased knowledge of use of DME or AE;Decreased cognition;Pain;Decreased knowledge of precautions;Cardiopulmonary status limiting activity      OT Treatment/Interventions: Self-care/ADL training;Therapeutic exercise;Energy conservation;DME and/or AE instruction;Therapeutic activities;Patient/family education;Balance training;Cognitive remediation/compensation    OT Goals(Current goals can be found in the care plan section) Acute Rehab OT Goals Patient Stated Goal: go outside OT Goal Formulation: With patient/family Time For Goal Achievement: 06/08/17 Potential to Achieve Goals: Good ADL Goals Pt Will Perform Grooming: with supervision;standing Pt Will Transfer to Toilet: ambulating;regular height toilet;with min guard assist Pt Will Perform Toileting - Clothing Manipulation and hygiene: with min guard assist;sit to/from stand Additional ADL Goal #1: Pt will demonstrate selective attention during standing grooming tasks in a moderately distracting environment. Additional ADL Goal #2: Pt will demonstrate energent awareness during morning ADL routine.  OT Frequency: Min 3X/week   Barriers to D/C:            Co-evaluation   Reason for Co-Treatment: Complexity of the patient's impairments (multi-system involvement);For patient/therapist safety;Necessary to address cognition/behavior during functional activity PT goals addressed during session:  Mobility/safety with mobility;Balance  AM-PAC PT "6 Clicks" Daily Activity     Outcome Measure Help from another person eating meals?: A Little Help from another person taking care of personal grooming?: A Little Help from another person toileting, which includes using toliet, bedpan, or urinal?: A Lot Help from another person bathing (including washing, rinsing, drying)?: A Little Help from another person to put on and taking off regular upper body clothing?: A Little Help from another person to put on and taking off regular lower body clothing?: A Little 6 Click Score: 17   End of Session Equipment Utilized During Treatment: Gait belt Nurse Communication: Mobility status  Activity Tolerance: Patient tolerated treatment well Patient left: in chair;with call bell/phone within reach;with family/visitor present;with nursing/sitter in room  OT Visit Diagnosis: Other abnormalities of gait and mobility (R26.89)                Time: 8119-1478 OT Time Calculation (min): 37 min Charges:  OT General Charges $OT Visit: 1 Visit OT Evaluation $OT Eval Moderate Complexity: 1 Mod G-Codes:     Doristine Section, MS OTR/L  Pager: [phone]   Chloe Baig A Mialani Reicks 05/25/2017, 2:58 PM

## 2017-05-25 NOTE — Progress Notes (Signed)
Progress Note  Patient Name: Tony Reynolds Date of Encounter: 05/25/2017  Primary Cardiologist: [facility]  Primary Electrophysiologist: [id number]   Patient Profile     47 y.o. male with LVH who suffered cardiac arrest\ and recurrent VF following cath  Cath  LAD 50% D1 75 Agitation and papers started for involuntary commitment   Subjective   Asleep and sedated again   Inpatient Medications    Scheduled Meds: . amiodarone  400 mg Oral BID  . amLODipine  10 mg Oral Daily  . aspirin  81 mg Oral Daily  . chlorhexidine  15 mL Mouth Rinse BID  . cloNIDine  0.1 mg Transdermal Weekly  . folic acid  1 mg Intravenous Daily  . haloperidol lactate  5 mg Intravenous Once  . heparin  5,000 Units Subcutaneous Q8H  . mouth rinse  15 mL Mouth Rinse q12n4p  . metoprolol tartrate  12.5 mg Oral BID  . nicotine  7 mg Transdermal Daily  . thiamine injection  100 mg Intravenous Daily   Continuous Infusions: . sodium chloride Stopped (05/23/17 0130)   PRN Meds: acetaminophen, hydrALAZINE, Influenza vac split quadrivalent PF, LORazepam, pneumococcal 23 valent vaccine   Vital Signs    Vitals:   05/25/17 0345 05/25/17 0425 05/25/17 0500 05/25/17 0730  BP: (!) 173/91 137/87  (!) 144/91  Pulse: 60   62  Resp: (!) 22 (!) 22    Temp: 97.9 F (36.6 C)   98 F (36.7 C)  TempSrc: Axillary   Oral  SpO2: 98%   98%  Weight:   153 lb 3.5 oz (69.5 kg)   Height:        Intake/Output Summary (Last 24 hours) at 05/25/2017 0853 Last data filed at 05/25/2017 1610 Gross per 24 hour  Intake 240 ml  Output 700 ml  Net -460 ml   Filed Weights   05/23/17 0700 05/24/17 0500 05/25/17 0500  Weight: 160 lb 0.9 oz (72.6 kg) 158 lb 11.7 oz (72 kg) 153 lb 3.5 oz (69.5 kg)    Telemetry      Personally Reviewed normal sinus rhythm  Review of the tachycardias around the time of catheterization interestingly demonstrated a r uniformly present t monomorphic PVC which is triggering the polymorphic ventricular  tachycardia  ECG  03/01/17 Sinus with normal QT interval   1 jan  Sinus with LBBB  Infer axis morphology QT 380  Sinus  LVH repol 15/10/55  Physical Exam  Well developed and nourished in no acute distress somnolent and not arousable HENT normal Neck supple with JVP-flat Clear Regular rate and rhythm, no murmurs or gallops Abd-soft with active BS No Clubbing cyanosis edema Skin-warm and dry multiple skin lesions ? cause    Grossly normal sensory and motor function   Labs    Chemistry Recent Labs  Lab 05/19/17 0154  05/20/17 0318  05/22/17 0345 05/23/17 0323 05/24/17 0314  NA 139   < > 136   < > 138 139 137  K 2.8*   < > 4.6   < > 3.4* 3.2* 3.6  CL 109   < > 109   < > 104 104 105  CO2 13*   < > 19*   < > 27 24 25   GLUCOSE 180*   < > 112*   < > 96 86 113*  BUN 9   < > 13   < > 16 14 9   CREATININE 1.12   < > 0.79   < >  1.00 1.03 0.94  CALCIUM 7.6*   < > 7.6*   < > 7.9* 8.3* 8.9  PROT 6.3*  --   --   --   --   --   --   ALBUMIN 3.3*  --  3.0*  --   --   --   --   AST 51*  --   --   --   --   --   --   ALT 24  --   --   --   --   --   --   ALKPHOS 66  --   --   --   --   --   --   BILITOT 0.8  --   --   --   --   --   --   GFRNONAA >60   < > >60   < > >60 >60 >60  GFRAA >60   < > >60   < > >60 >60 >60  ANIONGAP 17*   < > 8   < > 7 11 7    < > = values in this interval not displayed.     Hematology Recent Labs  Lab 05/21/17 0428 05/22/17 0345 05/24/17 0314  WBC 10.1 7.9 8.2  RBC 4.45 3.96* 4.59  HGB 12.9* 11.8* 13.6  HCT 38.1* 34.4* 39.3  MCV 85.6 86.9 85.6  MCH 29.0 29.8 29.6  MCHC 33.9 34.3 34.6  RDW 14.3 13.8 13.4  PLT 140* 132* 159    Cardiac Enzymes Recent Labs  Lab 05/19/17 0154 05/19/17 0430 05/19/17 0650 05/19/17 1552  TROPONINI 0.17* 2.32* 12.38* 25.35*    Recent Labs  Lab 05/19/17 0150  TROPIPOC 0.10*     BNPNo results for input(s): BNP, PROBNP in the last 168 hours.   DDimer No results for input(s): DDIMER in the last 168 hours.    Radiology    No results found.  Cardiac Studies    Echo EF 20-25%  Device Interrogation        Assessment & Plan    Cardiac arrest  Ventricular tachycardia  Cardiomyopathy- discordant data echo 20-25>>>Cath 35-40%  Skin lesions-hives  Agitation   QTc prolongation   Hypertension   Amiodarone therapy   Amio likely cause of QT prolongation as not evident on prior tracings--further, the initiation of VT was NOT pause dependent and did NOT represent torsades, but was triggered by the PVCs  Continue amio   No evidence of ARVC, and would like to suggest MRI scan if possible prior to ICD implantation  Would favor with skin lesions--open that we defer ICD therapy until heled and would use lifevest in the interim  With cardiomyopathy and hypertension , will stop metoprolol tartrate and begin carvdelol, downtitrate amlodipine and begin losartan in am, given long half life of amlodipine   Will arrange for life vest and will see again as needed with plan for outpt reassessment and ICD implant       Signed, Sherryl Manges, MD  05/25/2017, 8:53 AM

## 2017-05-25 NOTE — Progress Notes (Signed)
Restraints discontinued. Patient not interfering at this time. Will continue to monitor closely.   Delories Heinz, RN

## 2017-05-25 NOTE — Progress Notes (Signed)
CSW received consult for IVC but saw in notes that IVC likely already done  CSW looked at patients hard chart there is an IVC on chart starting 1/6.  Patient is IVC'd until 1/13 unless cleared by MD prior to that time  Please contact CSW to confirm IVC rescinded or over prior to DC  Burna Sis, LCSW Clinical Social Worker [phone]

## 2017-05-25 NOTE — Progress Notes (Signed)
Called to room by sitter. Pt agitated and wants to leave the hospital. Per wife, " He is upset because he found out that his mother is no longer alive." Pt tearful and tissues offered. Wife states that, " She died when he was in high school.".Marland KitchenMarland Kitchen Ativan given per MD order with relief. Patient called his sister for confirmation on mother's passing years ago. Supported patient emotionally. Pt requesting stronger nicotine patch. MD ordered. Will administer. Pt no resting alert and comfortable in recliner.   Nicholaus Bloom, RN

## 2017-05-25 NOTE — Progress Notes (Signed)
EP follow-up with Dr. Graciela Husbands has been arranged, order for LifeVest has been given to [employer] representative,, they are aware of the patient.  In d/w Dr. Graciela Husbands, general cardiology will resume cardiac care tomorrow, EP service will remain available.  Francis Dowse, PA-C

## 2017-05-25 NOTE — Progress Notes (Signed)
When asked what year it is pt states, "2035".  When asked who the president is he states, " Gaylord Shih!" Tony Reynolds is patient is oriented or just pulling my leg.   Delories Heinz, RN

## 2017-05-26 DIAGNOSIS — I2510 Atherosclerotic heart disease of native coronary artery without angina pectoris: Secondary | ICD-10-CM

## 2017-05-26 DIAGNOSIS — F1099 Alcohol use, unspecified with unspecified alcohol-induced disorder: Secondary | ICD-10-CM

## 2017-05-26 DIAGNOSIS — R1310 Dysphagia, unspecified: Secondary | ICD-10-CM

## 2017-05-26 DIAGNOSIS — Z720 Tobacco use: Secondary | ICD-10-CM

## 2017-05-26 DIAGNOSIS — I10 Essential (primary) hypertension: Secondary | ICD-10-CM

## 2017-05-26 DIAGNOSIS — M549 Dorsalgia, unspecified: Secondary | ICD-10-CM

## 2017-05-26 DIAGNOSIS — I5021 Acute systolic (congestive) heart failure: Secondary | ICD-10-CM

## 2017-05-26 DIAGNOSIS — Z008 Encounter for other general examination: Secondary | ICD-10-CM

## 2017-05-26 DIAGNOSIS — I2109 ST elevation (STEMI) myocardial infarction involving other coronary artery of anterior wall: Secondary | ICD-10-CM

## 2017-05-26 DIAGNOSIS — I5041 Acute combined systolic (congestive) and diastolic (congestive) heart failure: Secondary | ICD-10-CM

## 2017-05-26 DIAGNOSIS — F1010 Alcohol abuse, uncomplicated: Secondary | ICD-10-CM

## 2017-05-26 DIAGNOSIS — R451 Restlessness and agitation: Secondary | ICD-10-CM

## 2017-05-26 DIAGNOSIS — F17210 Nicotine dependence, cigarettes, uncomplicated: Secondary | ICD-10-CM

## 2017-05-26 DIAGNOSIS — E876 Hypokalemia: Secondary | ICD-10-CM

## 2017-05-26 LAB — BASIC METABOLIC PANEL
Anion gap: 15 (ref 5–15)
BUN: 16 mg/dL (ref 6–20)
CALCIUM: 8.7 mg/dL — AB (ref 8.9–10.3)
CHLORIDE: 103 mmol/L (ref 101–111)
CO2: 18 mmol/L — ABNORMAL LOW (ref 22–32)
CREATININE: 0.96 mg/dL (ref 0.61–1.24)
Glucose, Bld: 88 mg/dL (ref 65–99)
Potassium: 3.4 mmol/L — ABNORMAL LOW (ref 3.5–5.1)
SODIUM: 136 mmol/L (ref 135–145)

## 2017-05-26 LAB — MAGNESIUM: MAGNESIUM: 2.1 mg/dL (ref 1.7–2.4)

## 2017-05-26 MED ORDER — DIVALPROEX SODIUM 250 MG PO DR TAB
250.0000 mg | DELAYED_RELEASE_TABLET | Freq: Two times a day (BID) | ORAL | Status: DC
  Administered 2017-05-26 – 2017-05-28 (×4): 250 mg via ORAL
  Filled 2017-05-26 (×5): qty 1

## 2017-05-26 MED ORDER — MAGIC MOUTHWASH
15.0000 mL | Freq: Three times a day (TID) | ORAL | Status: DC
  Administered 2017-05-26 – 2017-05-28 (×7): 15 mL via ORAL
  Filled 2017-05-26 (×9): qty 15

## 2017-05-26 MED ORDER — LORAZEPAM 2 MG/ML IJ SOLN: 1.0000 mg | INTRAMUSCULAR | Status: DC | PRN

## 2017-05-26 MED ORDER — CARVEDILOL 12.5 MG PO TABS
12.5000 mg | ORAL_TABLET | Freq: Two times a day (BID) | ORAL | Status: DC
  Administered 2017-05-26 – 2017-05-28 (×5): 12.5 mg via ORAL
  Filled 2017-05-26 (×6): qty 1

## 2017-05-26 MED ORDER — DIVALPROEX SODIUM 250 MG PO DR TAB: 250.0000 mg | DELAYED_RELEASE_TABLET | Freq: Two times a day (BID) | ORAL | Status: DC

## 2017-05-26 MED ORDER — WHITE PETROLATUM EX OINT
TOPICAL_OINTMENT | CUTANEOUS | Status: DC | PRN
Start: 2017-05-26 — End: 2017-05-28
  Filled 2017-05-26: qty 28.35

## 2017-05-26 MED ORDER — ATORVASTATIN CALCIUM 20 MG PO TABS
20.0000 mg | ORAL_TABLET | Freq: Every day | ORAL | Status: DC
  Administered 2017-05-26 – 2017-05-28 (×3): 20 mg via ORAL
  Filled 2017-05-26 (×3): qty 1

## 2017-05-26 MED ORDER — POTASSIUM CHLORIDE 20 MEQ/15ML (10%) PO SOLN
40.0000 meq | Freq: Once | ORAL | Status: AC
  Administered 2017-05-26: 40 meq via ORAL
  Filled 2017-05-26: qty 30

## 2017-05-26 MED ORDER — LORAZEPAM 2 MG/ML IJ SOLN
INTRAMUSCULAR | Status: AC
  Administered 2017-05-26: 2 mg
  Filled 2017-05-26: qty 1

## 2017-05-26 MED ORDER — LORAZEPAM 2 MG/ML IJ SOLN
2.0000 mg | INTRAMUSCULAR | Status: DC | PRN
  Administered 2017-05-26: 4 mg via INTRAVENOUS
  Administered 2017-05-26: 2 mg via INTRAVENOUS
  Administered 2017-05-27: 4 mg via INTRAVENOUS
  Filled 2017-05-26 (×3): qty 2

## 2017-05-26 NOTE — Progress Notes (Signed)
Of note, pt had very detailed conversation with me about fishing at [facility]. Fishing is his favorite hobby. Pt shared fishing stories back and forth with this RN. Pt agitated about having to stay at the hospital. Pt requested something for his "nerves". PRN ativan given. Agitation decreased. Will continue to monitor closely.   Pt with raw blisters and some bleeding on his penis. Pt also with blisters and healing spots on his tongue.    Delories Heinz, RN

## 2017-05-26 NOTE — Progress Notes (Signed)
Pt is very aggressive physicall and verbally, threaten to kill or hit staff, confuse, asking for his mom and kids, trying to leave, wife is at bed side, Md page concerning behavior, order medication and 2 point restraints. While in 2 point restraint pt still aggressive, trying to bite off restraint with teeth, and get out of bed, MD called again, order 4 point soft restraints. Sitter at bed side with patient

## 2017-05-26 NOTE — Progress Notes (Signed)
Pt transfer from 2 heart, alert, oriented to unit, connected to tele monitor, sitter at bed side.

## 2017-05-26 NOTE — Care Management Note (Addendum)
Case Management Note  Patient Details  Name: Tony Reynolds MRN: [id number] Date of Birth: June 12, 1970  Called to speak Rosanne Ashing regarding order for life Vest. Elberta Spaniel states Live Vest has been approved and he is following for discharge. NCM will continue to follow for discharge transition needs.             Expected Discharge Plan:  IP Rehab Facility  Discharge planning Services  CM Consult  Post Acute Care Choice:  Durable Medical Equipment Choice offered to:     DME Arranged:  Life vest DME Agency:  [employer]  Status of Service:  In process, will continue to follow  Cloyde Reams BSN, RN Case Manager-Orientation [phone] 05/26/2017, 1:52 PM

## 2017-05-26 NOTE — Progress Notes (Signed)
Called MD regarding pt unsafe aggressive behavior  AC, AD, CN, sitter and pt primary RN at bedside. Pt biting at restraints. Pt eating at restraints. Awaiting call back from MD

## 2017-05-26 NOTE — Progress Notes (Signed)
Pt is still very agitated and restless, trying to get out of bed and biting of restraints, security called to help keep pt in bed. Restrains still in placed.

## 2017-05-26 NOTE — Consult Note (Addendum)
WOC Nurse wound consult note Reason for Consult: Consult requested for bilat knees and penis.  Pt has bilsters of unknown origin, which have started to rupture and evolve into full thickness tissue loss.  End of penis with full thickness wounds; approx 2X1X.2cm area is affected.  Red and dry, no odor or drainage. Pt states it is tender. Left knee is 30% red moist full thickness wounds where blisters have ruptured, 70% intact, loose clear fluid filled blisters which will eventually peel. Affected area is 12X12X.2cm Mod amt yellow drainage, no odor. Right knee 40% red moist full thickness wounds where blisters have ruptured, 60% intact, loose clear fluid filled blisters which will eventually peel. Affected area is 16X16X.2cm Mod amt yellow drainage, no odor. Dressing procedure/placement/frequency: Xeroform gauze to promote drying and healing of all locations.  Discussed plan of care with patient but he did not appear to understand and there are no family members present. Please re-consult if further assistance is needed.  Thank-you,  Cammie Mcgee MSN, RN, CWOCN, [place], CNS [phone]

## 2017-05-26 NOTE — Progress Notes (Signed)
Patient is very confused, agitated verbally and physically abusive and threatening towards nursing staff. MD AND security called for support. Off [employer] present to assist with patient. Patient's wife is at bedside attempting to reason with patient with no success. Patient is asking for dead mother. Patient is now in 4 point restratint after second call to MD. Patient was biting off dressings and restraints.

## 2017-05-26 NOTE — Progress Notes (Signed)
PROGRESS NOTE  Tony Reynolds  DPO:[id number] DOB: 1971-01-16 DOA: 05/19/2017 PCP: Benita Stabile, MD   Brief Narrative: Tony Reynolds is a 47 y.o. male with a history of HTN, tobacco use, and LVH who experienced cardiac arrest at home with at least 18 minutes of no CPR support. He was intubated and admitted to the ICU with urgent catheterization showing minimal occlusive disease. He experienced repeat VFib arrest on the cath table with ROSC after 1 shock. Arrhythmias continued with EP following. Plan was for ICD though bullous skin lesions on legs has caused delay, so pt will have lifevest applied. Therapy evaluation has occurred, recommending CIR who is consulted. The patient is suspected to have had anoxic brain injury and lacks capacity for medical decision making, therefore IVC was filed 1/6.   Assessment & Plan: Cardiac arrest: No CPR at scene, ROSC per EMS after at least 18 minutes.  - EP consulted, on amiodarone - LifeVest arranged and follow up arranged per EP.   Chronic systolic CHF, HTN, CAD: EF 20-25% by echo, 35-40% by cath.  - Antihypertensives per EP/cardiology. Have been decreasing norvasc. - Coreg (dose increase since BPs trending upward), aspirin, clonidine; starting statin  QT prolongation: - Would avoid provocative medications, including antipsychotics, at all cost.   Hypokalemia:  - Replace and continue daily monitoring w/Mg.   Anoxic brain injury: Pt is adamant to leave the hospital though does not seem to have capacity to make this decision at this time. IVC filed 1/6.  - Continue to monitor and treat supportively. prn Ativan ordered. Sitter ordered.  - Delirium precautions, haldol only if imminent danger to self or others given QT prolongation.   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 Uncertain etiology. Not typical photosensitivity reaction to amiodarone. Not typical for allergic reaction systemically. No mucous membrane involvement.  - Keep ruptured bullae  covered, no evidence of infection. WOC consulted, recommended xeroform gauze.  - Monitor. Consider further work up/dermatology referral if continues.   Tobacco use:  - Nicotine patch 21mg   Right leg swelling: Mild, noted 1/8.  - RLE U/S  DVT prophylaxis: Lovenox Code Status: Full Family Communication: Wife at bedside Disposition Plan: CIR consulted for admission.  Consultants:   PCCM  Cardiology  EP  PM&R  Procedures:  1/01  LHC.  Hypothermia. Intubated 1/1 - 1/4 OGT 1/1>> 1/4 Foley 1/1 >> 1/4   Antimicrobials:  None   Subjective: Having intermittent agitation improved with sitter and wife at bedside. Worse last night when he was told of his mother's death which occurred when he was a teenager. Blisters stable. Feels his right leg/ankle are swelling, though denies any trauma. No h/o gout or DVT. No fever.   Objective: Vitals:   05/26/17 1030 05/26/17 1031 05/26/17 1035 05/26/17 1211  BP: (!) 166/110 (!) 166/110 (!) 110/99 (!) 141/88  Pulse:    81  Resp: (!) 21  (!) 23 18  Temp:    98.6 F (37 C)  TempSrc:    Oral  SpO2:    100%  Weight:    69.7 kg (153 lb 9.6 oz)  Height:    5\' 10"  (1.778 m)    Intake/Output Summary (Last 24 hours) at 05/26/2017 1303 Last data filed at 05/26/2017 0400 Gross per 24 hour  Intake 240 ml  Output 302 ml  Net -62 ml   Filed Weights   05/25/17 0500 05/26/17 0500 05/26/17 1211  Weight: 69.5 kg (153 lb 3.5 oz) 72.9 kg (160 lb 11.4  oz) 69.7 kg (153 lb 9.6 oz)    Gen: 47 y.o. male in no distress  Pulm: Non-labored breathing. Clear to auscultation bilaterally.  CV: Regular rate and rhythm. No murmur, rub, or gallop. No JVD, trace pedal edema. GI: Abdomen soft, non-tender, non-distended, with normoactive bowel sounds. No organomegaly or masses felt. Ext: Warm, no deformities Skin: Nonerythematous bullae on left anterior shin and knee with unroofed blister w/foam dressing covering healthy full thickness wound. RLE also w/bullae  covered by dressings. No abscess or purulence or excoriations. Penis with irritated foreskin w/wounds not malodorous or purulent.  Neuro: Alert, requires frequent redirection. No focal neurological deficits. Psych: Judgement and insight appear impaired. Short and long term recall severely impaired. Mood & affect appropriate.   Data Reviewed: I have personally reviewed following labs and imaging studies  CBC: Recent Labs  Lab 05/20/17 0318 05/21/17 0428 05/22/17 0345 05/24/17 0314  WBC 9.9 10.1 7.9 8.2  HGB 14.8 12.9* 11.8* 13.6  HCT 42.3 38.1* 34.4* 39.3  MCV 86.2 85.6 86.9 85.6  PLT 152 140* 132* 159   Basic Metabolic Panel: Recent Labs  Lab 05/20/17 0318 05/21/17 0428 05/22/17 0345 05/23/17 0323 05/24/17 0314 05/25/17 0258 05/26/17 0309  NA 136 138 138 139 137  --  136  K 4.6 3.8 3.4* 3.2* 3.6  --  3.4*  CL 109 107 104 104 105  --  103  CO2 19* 24 27 24 25   --  18*  GLUCOSE 112* 93 96 86 113*  --  88  BUN 13 14 16 14 9   --  16  CREATININE 0.79 0.93 1.00 1.03 0.94  --  0.96  CALCIUM 7.6* 7.9* 7.9* 8.3* 8.9  --  8.7*  MG 2.1  2.1 2.0 2.3 2.4 2.2 2.2 2.1  PHOS 2.1*  --   --   --   --   --   --    GFR: Estimated Creatinine Clearance: 96.8 mL/min (by C-G formula based on SCr of 0.96 mg/dL). Liver Function Tests: Recent Labs  Lab 05/20/17 0318  ALBUMIN 3.0*   No results for input(s): LIPASE, AMYLASE in the last 168 hours. No results for input(s): AMMONIA in the last 168 hours. Coagulation Profile: No results for input(s): INR, PROTIME in the last 168 hours. Cardiac Enzymes: Recent Labs  Lab 05/19/17 1552  TROPONINI 25.35*   BNP (last 3 results) No results for input(s): PROBNP in the last 8760 hours. HbA1C: No results for input(s): HGBA1C in the last 72 hours. CBG: Recent Labs  Lab 05/22/17 2011 05/22/17 2314 05/23/17 0418 05/23/17 0819 05/23/17 1136  GLUCAP 97 81 76 132* 132*   Lipid Profile: No results for input(s): CHOL, HDL, LDLCALC, TRIG,  CHOLHDL, LDLDIRECT in the last 72 hours. Thyroid Function Tests: No results for input(s): TSH, T4TOTAL, FREET4, T3FREE, THYROIDAB in the last 72 hours. Anemia Panel: No results for input(s): VITAMINB12, FOLATE, FERRITIN, TIBC, IRON, RETICCTPCT in the last 72 hours. Urine analysis:    Component Value Date/Time   COLORURINE YELLOW 05/19/2017 0343   APPEARANCEUR CLOUDY (A) 05/19/2017 0343   LABSPEC 1.039 (H) 05/19/2017 0343   PHURINE 5.0 05/19/2017 0343   GLUCOSEU 50 (A) 05/19/2017 0343   HGBUR MODERATE (A) 05/19/2017 0343   BILIRUBINUR NEGATIVE 05/19/2017 0343   KETONESUR NEGATIVE 05/19/2017 0343   PROTEINUR 100 (A) 05/19/2017 0343   UROBILINOGEN 0.2 02/16/2014 0820   NITRITE NEGATIVE 05/19/2017 0343   LEUKOCYTESUR NEGATIVE 05/19/2017 0343   Recent Results (from the past 240 hour(s))  MRSA PCR Screening     Status: None   Collection Time: 05/19/17  3:38 AM  Result Value Ref Range Status   MRSA by PCR NEGATIVE NEGATIVE Final    Comment:        The GeneXpert MRSA Assay (FDA approved for NASAL specimens only), is one component of a comprehensive MRSA colonization surveillance program. It is not intended to diagnose MRSA infection nor to guide or monitor treatment for MRSA infections.       Radiology Studies: No results found.  Scheduled Meds: . amiodarone  400 mg Oral BID  . amLODipine  2.5 mg Oral Daily  . aspirin  81 mg Oral Daily  . atorvastatin  20 mg Oral q1800  . carvedilol  12.5 mg Oral BID WC  . chlorhexidine  15 mL Mouth Rinse BID  . cloNIDine  0.1 mg Transdermal Weekly  . folic acid  1 mg Intravenous Daily  . haloperidol lactate  5 mg Intravenous Once  . heparin  5,000 Units Subcutaneous Q8H  . losartan  25 mg Oral Daily  . magic mouthwash  15 mL Oral TID  . mouth rinse  15 mL Mouth Rinse q12n4p  . nicotine  21 mg Transdermal Daily  . thiamine injection  100 mg Intravenous Daily   Continuous Infusions: . sodium chloride Stopped (05/23/17 0130)      LOS: 7 days   Time spent: 25 minutes.  Hazeline Junker, MD [facility] Pager [phone]  If 7PM-7AM, please contact night-coverage [email or web address] Password [email or web address] 05/26/2017, 1:03 PM

## 2017-05-26 NOTE — Progress Notes (Signed)
Report called to 3E

## 2017-05-26 NOTE — Progress Notes (Signed)
Pt was biting on two point restraint, still trying to get out of bed, and removing tele, MD called again, order 4 point soft restraint. Restraints applied.

## 2017-05-26 NOTE — Consult Note (Signed)
[facility] Face-to-Face Psychiatry Consult   Reason for Consult:  Agitation in setting of anoxic brain injury. Referring Physician:  Dr. Bonner Puna Patient Identification: Tony Reynolds MRN:  [id number] Principal Diagnosis: Agitation Diagnosis:   Patient Active Problem List   Diagnosis Date Noted  . Dysphagia [R13.10]   . Acute systolic congestive heart failure ([place]) [I50.21]   . ETOH abuse [F10.10]   . Coronary artery disease involving native coronary artery of native heart without angina pectoris [I25.10]   . Acute combined systolic and diastolic congestive heart failure ([place]) [I50.41]   . Tobacco abuse [Z72.0]   . Benign essential HTN [I10]   . Agitation [R45.1]   . Hypokalemia [E87.6]   . Cardiac arrest ([place]) [I46.9] 05/19/2017  . Acute myocardial infarction (Tony Reynolds) [I21.9]   . Endotracheally intubated [Z97.8]   . 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 Avascular necrosis of bones of both hips (Tony Reynolds) [M87.051, M87.052] 07/04/2015  . Avascular necrosis of left femoral head [facility]) [M87.052] 07/04/2015    Total Time spent with patient: 1 hour  Subjective:   Tony Reynolds is a 47 y.o. male patient admitted with anoxic brain injury secondary to cardiac arrest.  HPI:   Per chart review, patient experienced a cardiac arrest at home with at least 18 minutes of no CPR support on 1/1. He was intubated and admitted to the ICU with urgent catheterization showing minimal occlusive disease. He had repeat ventricular fibrillation on the cath table with ROSC after 1 shock. He has been unable to have ICD due to bullous skin lesions on legs so a LifeVest was applied. He has been verbally and physically aggressive in the setting of confusion. He requested to leave the hospital and was placed under IVC on 1/6. He is requiring 2 point restraints which he has attempted to remove by biting. He has received multiple doses of IV Ativan 2 mg (x 2 doses today).   On interview, [name] is  confused. He is only oriented to self. His wife is at bedside but he reports that she is not his wife. She states that he believes that she is his "baby mama." He reports walking to the hospital. He believes that he is receiving treatment for his leg and back pain. He reports that he has been in the hospital for 6 months and today is 04/28/2017. He denies SI, HI or AVH. He denies problems with sleep or appetite. He denies a prior psychiatric history.   Mrs. Tony Reynolds was spoken to separately with patient's permission. She was clearly frustrated with the patient's condition and lack of knowledge about his current condition. She was provided education about his condition and support. She reports that he has no prior psychiatric history that she is aware of. He was doing well until his recent medical problems.   Past Psychiatric History: Denies   Risk to Self: Is patient at risk for suicide?: No Risk to Others:  None. Denies HI. Prior Inpatient Therapy:  Denies  Prior Outpatient Therapy:  Denies   Past Medical History:  Past Medical History:  Diagnosis Date  . Arthritis   . Back pain   . Eczema   . Eczema   . Hypertension   . Lumbar disc herniation     Past Surgical History:  Procedure Laterality Date  . BILATERAL ANTERIOR TOTAL HIP ARTHROPLASTY Bilateral 12/14/2015   Procedure: BILATERAL ANTERIOR TOTAL HIP ARTHROPLASTY;  Surgeon: Mcarthur Rossetti, MD;  Location: [facility];  Service: Orthopedics;  Laterality: Bilateral;  . CORONARY/GRAFT ACUTE MI REVASCULARIZATION N/A 05/19/2017   Procedure: Coronary/Graft Acute MI Revascularization;  Surgeon: Sherren Mocha, MD;  Location: [place] [facility];  Service: Cardiovascular;  Laterality: N/A;  . LEFT HEART CATH AND CORONARY ANGIOGRAPHY N/A 05/19/2017   Procedure: LEFT HEART CATH AND CORONARY ANGIOGRAPHY;  Surgeon: Sherren Mocha, MD;  Location: [place] CV [facility];  Service: Cardiovascular;  Laterality: N/A;  . NO PAST SURGERIES     Family History:  No family history on file. Family Psychiatric  History: Unknown  Social History:  Social History   Substance and Sexual Activity  Alcohol Use Yes  . Alcohol/week: 0.0 oz   Comment: 14  total  of 12 oz beer weekly     Social History   Substance and Sexual Activity  Drug Use No    Social History   Socioeconomic History  . Marital status: Married    Spouse name: None  . Number of children: None  . Years of education: None  . Highest education level: None  Social Needs  . Financial resource strain: None  . Food insecurity - worry: None  . Food insecurity - inability: None  . Transportation needs - medical: None  . Transportation needs - non-medical: None  Occupational History  . None  Tobacco Use  . Smoking status: Current Every Day Smoker    Packs/day: 0.50    Types: Cigarettes    Start date: 04/01/1989  . Smokeless tobacco: Never Used  . Tobacco comment: 10 ciggs per day  Substance and Sexual Activity  . Alcohol use: Yes    Alcohol/week: 0.0 oz    Comment: 14  total  of 12 oz beer weekly  . Drug use: No  . Sexual activity: None  Other Topics Concern  . None  Social History Narrative  . None   Additional Social History: He lives at home with his wife of 5 years. He has 3 children from a prior relationship. He denies alcohol or illicit substance use. He was incarcerated for 12.5 years. Wife did not elaborate further.      Allergies:   Allergies  Allergen Reactions  . Tomato Other (See Comments)    No acidic foods    Labs:  Results for orders placed or performed during the hospital encounter of 05/19/17 (from the past 48 hour(s))  Magnesium     Status: None   Collection Time: 05/25/17  2:58 AM  Result Value Ref Range   Magnesium 2.2 1.7 - 2.4 mg/dL  Magnesium     Status: None   Collection Time: 05/26/17  3:09 AM  Result Value Ref Range   Magnesium 2.1 1.7 - 2.4 mg/dL  Basic metabolic panel     Status: Abnormal   Collection Time: 05/26/17  3:09 AM   Result Value Ref Range   Sodium 136 135 - 145 mmol/L   Potassium 3.4 (L) 3.5 - 5.1 mmol/L   Chloride 103 101 - 111 mmol/L   CO2 18 (L) 22 - 32 mmol/L   Glucose, Bld 88 65 - 99 mg/dL   BUN 16 6 - 20 mg/dL   Creatinine, Ser 0.96 0.61 - 1.24 mg/dL   Calcium 8.7 (L) 8.9 - 10.3 mg/dL   GFR calc non Af Amer >60 >60 mL/min   GFR calc Af Amer >60 >60 mL/min    Comment: (NOTE) The eGFR has been calculated using the CKD EPI equation. This calculation has not been validated in all clinical situations. eGFR's persistently <  60 mL/min signify possible Chronic Kidney Disease.    Anion gap 15 5 - 15    Current Facility-Administered Medications  Medication Dose Route Frequency Provider Last Rate Last Dose  . 0.9 %  sodium chloride infusion   Intravenous Continuous Juanito Doom, MD   Stopped at 05/23/17 0130  . acetaminophen (TYLENOL) suppository 650 mg  650 mg Rectal Q4H PRN Collene Gobble, MD   650 mg at 05/21/17 2129  . amiodarone (PACERONE) tablet 400 mg  400 mg Oral BID Noe Gens L, NP   400 mg at 05/26/17 0927  . amLODipine (NORVASC) tablet 2.5 mg  2.5 mg Oral Daily Deboraha Sprang, MD   2.5 mg at 05/26/17 1610  . aspirin chewable tablet 81 mg  81 mg Oral Daily Sherren Mocha, MD   81 mg at 05/26/17 0926  . atorvastatin (LIPITOR) tablet 20 mg  20 mg Oral q1800 Burnell Blanks, MD      . carvedilol (COREG) tablet 12.5 mg  12.5 mg Oral BID WC Burnell Blanks, MD      . chlorhexidine (PERIDEX) 0.12 % solution 15 mL  15 mL Mouth Rinse BID Collene Gobble, MD   15 mL at 05/26/17 0926  . cloNIDine (CATAPRES - Dosed in mg/24 hr) patch 0.1 mg  0.1 mg Transdermal Weekly Corey Harold, NP   0.1 mg at 05/20/17 1511  . folic acid injection 1 mg  1 mg Intravenous Daily Corey Harold, NP   1 mg at 05/26/17 9604  . haloperidol lactate (HALDOL) injection 5 mg  5 mg Intravenous Once Ollis, Brandi L, NP      . heparin injection 5,000 Units  5,000 Units Subcutaneous Q8H Jennelle Human B, NP   5,000 Units at 05/26/17 0538  . hydrALAZINE (APRESOLINE) injection 10-40 mg  10-40 mg Intravenous Q4H PRN Simonne Maffucci B, MD   20 mg at 05/26/17 1031  . Influenza vac split quadrivalent PF (FLUARIX) injection 0.5 mL  0.5 mL Intramuscular Prior to discharge Collene Gobble, MD      . LORazepam (ATIVAN) injection 2-4 mg  2-4 mg Intravenous Q4H PRN Patrecia Pour, MD      . losartan (COZAAR) tablet 25 mg  25 mg Oral Daily Deboraha Sprang, MD   25 mg at 05/26/17 5409  . magic mouthwash  15 mL Oral TID Patrecia Pour, MD   15 mL at 05/26/17 1031  . MEDLINE mouth rinse  15 mL Mouth Rinse q12n4p Collene Gobble, MD   15 mL at 05/23/17 1200  . nicotine (NICODERM CQ - dosed in mg/24 hours) patch 21 mg  21 mg Transdermal Daily Patrecia Pour, MD   21 mg at 05/25/17 1743  . pneumococcal 23 valent vaccine (PNU-IMMUNE) injection 0.5 mL  0.5 mL Intramuscular Prior to discharge Collene Gobble, MD      . thiamine (B-1) injection 100 mg  100 mg Intravenous Daily Corey Harold, NP   100 mg at 05/26/17 0926  . white petrolatum (VASELINE) gel   Topical PRN Patrecia Pour, MD        Musculoskeletal: Strength & Muscle Tone: UTA since patient was in 4 point restraints. Gait & Station: UTA since patient was lying in bed in 4 point restraints.  Patient leans: N/A  Psychiatric Specialty Exam: Physical Exam  Nursing note and vitals reviewed. Constitutional: He appears well-developed and well-nourished.  HENT:  Head: Normocephalic.  Neck: Normal range  of motion.  Respiratory: Effort normal.  Musculoskeletal: Normal range of motion.  Neurological: He is alert.  Skin: No rash noted.    Review of Systems  Musculoskeletal: Positive for back pain.       Bilateral leg pain   Psychiatric/Behavioral: Negative for depression, hallucinations, substance abuse and suicidal ideas. The patient is not nervous/anxious and does not have insomnia.   All other systems reviewed and are negative.   Blood  pressure (!) 141/88, pulse 81, temperature 98.6 F (37 C), temperature source Oral, resp. rate 18, height _0  (1.778 m), weight 69.7 kg (153 lb 9.6 oz), SpO2 100 %.Body mass index is 22.04 kg/m.  General Appearance: Fairly Groomed, middle aged, African American male who is in 4 point soft restraints, wearing a hospital gown and lying in bed. NAD.   Eye Contact:  Good  Speech:  Clear and Coherent and Normal Rate  Volume:  Normal  Mood:  Irritable  Affect:  Labile  Thought Process:  Linear  Orientation:  Other:  Person  Thought Content:  Illogical  Suicidal Thoughts:  No  Homicidal Thoughts:  No  Memory:  Immediate;   Poor Recent;   Poor Remote;   Poor  Judgement:  Impaired  Insight:  Lacking  Psychomotor Activity:  Increased due to agitation and attempting to release restraints.   Concentration:  Concentration: Fair and Attention Span: Fair  Recall:  Poor  Fund of Knowledge:  Poor  Language:  Fair  Akathisia:  No  Handed:  Right  AIMS (if indicated):   N/A  Assets:  Financial Resources/Insurance Housing Social Support  ADL's:  Impaired  Cognition:  Impaired due to recent anoxic brain injury.   Sleep:   N/A   Assessment: Tony Reynolds is a 47 y.o. male who was admitted with anoxic brain injury secondary to cardiac arrest. His hospital course has been complicated by ventricular fibrillation requiring LifeVest and agitation in the setting of confusion. He is susceptible to seizures in setting of antipsychotic use with recent brain injury so use is not advised especially in the setting of QTc prolongation (576 on 1/6). He may benefit from low dose Depakote which may be titrated for symptom management.   Treatment Plan Summary: -Recommend Depakote 250 mg BID for agitation in setting of recent brain injury. Can titrate by increasing by 250 mg increments every 2-3 days for symptom management. Spoke to wife and she is agreeable to Depakote use. -Avoid medications that can lower  seizure threshold such as antipsychotics. -Continue to use soft restraints as necessary for imminent risk of harm to self or others. -Patient is IVC'd so he may not leave the hospital given necessary treatment of medical condition. He does not demonstrate capacity at this time to make medical decisions related to his current care due to lack of insight about condition and cognitive deficits related to brain injury.  -Psychiatry will sign off patient at this time. Please consult psychiatry again as needed.    Disposition: Patient does not meet criteria for psychiatric inpatient admission.  Faythe Dingwall, DO 05/26/2017 3:02 PM

## 2017-05-26 NOTE — Progress Notes (Addendum)
Noted pt with IVC papers 1/6 until 05/31/17. Agitation, sitter, and restrained. I contacted [place] , SW to clarify IVC. Recommend psych consult for medication and behavior management recommendations. I will follow. [phone]

## 2017-05-26 NOTE — Care Management Note (Addendum)
Case Management Note Donn Pierini RN, BSN Unit 4E-Case Manager-- 2H coverage [phone]  Patient Details  Name: Tony Reynolds MRN: [id number] Date of Birth: 24-Feb-1971  Subjective/Objective:  Pt admitted s/p cardiac arrest- currently on vent                  Action/Plan: PTA pt lived at home with wife- independent- CM to follow for transition of care needs.   Expected Discharge Date:                  Expected Discharge Plan:  IP Rehab Facility  In-House Referral:     Discharge planning Services  CM Consult  Post Acute Care Choice:  Durable Medical Equipment Choice offered to:     DME Arranged:  Life vest DME Agency:  [facility]  HH Arranged:    HH Agency:     Status of Service:  In process, will continue to follow  If discussed at Long Length of Stay Meetings, dates discussed:    Discharge Disposition:   Additional Comments:  05/26/17- 1130- Donn Pierini RN, CM- pt will need Life Vest for discharge- order has been sent to [facility] and pt has been approved per Rosanne Ashing at [place]- awaiting expected discharge date for fitting- will update Rosanne Ashing when pt closer to discharge - CIR has been consulted for possible admission- Consult pending- pt with anoxic brian injury and has been IVC'd on 1/6 due to not having capacity to make medical decisions. Pt will need to continue to work with PT to mobilize. CM to continue to follow for transition needs- wife at bedside.   Darrold Span, RN 05/26/2017, 11:56 AM

## 2017-05-26 NOTE — Consult Note (Signed)
Physical Medicine and Rehabilitation Consult Reason for Consult: Decreased functional mobility Referring Physician: [facility]   HPI: Tony Reynolds is a 47 y.o. right handed male with history of bilateral total hip arthroplasty, grade 1 diastolic dysfunction, hypertension, tobacco abuse. Per chart review and sitter, patient lives with spouse. Independent prior to admission still working. Wife also works during the day. Presented 05/19/2017 after cardiac arrest while at home celebrating the new year. Patient in ventricular fibrillation upon arrival by EMS. Receive CPR seen 10 minutes. EKG concerning for anterior MI. Troponin 0.17. Alcohol level 205. Potassium 2.7. Underwent emergent cardiac catheterization 50% LAD and 75% diagonal with no interventional needs per cardiology services. Maintained on full ventilatory support. EEG no seizure activity. Echocardiogram with ejection fraction 25% diffuse hypokinesis grade 2 diastolic dysfunction. Order for LifeVest. Venous Doppler right lower extremity negative for DVT. Subcutaneous heparin for DVT prophylaxis. Dysphagia #2 thin liquid diet. Noted ongoing bouts of restlessness agitation suspect encephalopathy after cardiac arrest. Physical occupational therapy evaluations completed with recommendations of physical medicine rehabilitation consult.   Review of Systems  Constitutional: Negative for chills and fever.  HENT: Negative for hearing loss.   Eyes: Negative for blurred vision and double vision.  Respiratory: Positive for cough. Negative for shortness of breath.   Cardiovascular: Positive for palpitations. Negative for leg swelling.  Gastrointestinal: Positive for constipation. Negative for nausea and vomiting.  Genitourinary: Negative for dysuria and hematuria.  Musculoskeletal: Positive for back pain and joint pain.  Skin: Negative for rash.  All other systems reviewed and are negative.  Past Medical History:  Diagnosis Date  . Arthritis     . Back pain   . Eczema   . Eczema   . Hypertension   . Lumbar disc herniation    Past Surgical History:  Procedure Laterality Date  . BILATERAL ANTERIOR TOTAL HIP ARTHROPLASTY Bilateral 12/14/2015   Procedure: BILATERAL ANTERIOR TOTAL HIP ARTHROPLASTY;  Surgeon: Kathryne Hitch, MD;  Location: [facility];  Service: Orthopedics;  Laterality: Bilateral;  . CORONARY/GRAFT ACUTE MI REVASCULARIZATION N/A 05/19/2017   Procedure: Coronary/Graft Acute MI Revascularization;  Surgeon: Tonny Bollman, MD;  Location: [facility] INVASIVE CV LAB;  Service: Cardiovascular;  Laterality: N/A;  . LEFT HEART CATH AND CORONARY ANGIOGRAPHY N/A 05/19/2017   Procedure: LEFT HEART CATH AND CORONARY ANGIOGRAPHY;  Surgeon: Tonny Bollman, MD;  Location: [facility] INVASIVE CV LAB;  Service: Cardiovascular;  Laterality: N/A;  . NO PAST SURGERIES     No family history on file. Social History:  reports that he has been smoking cigarettes.  He started smoking about 28 years ago. He has been smoking about 0.50 packs per day. he has never used smokeless tobacco. He reports that he drinks alcohol. He reports that he does not use drugs. Allergies:  Allergies  Allergen Reactions  . Tomato Other (See Comments)    No acidic foods   Medications Prior to Admission  Medication Sig Dispense Refill  . methocarbamol (ROBAXIN) 500 MG tablet Take 1 tablet (500 mg total) by mouth every 6 (six) hours as needed for muscle spasms. 60 tablet 0  . amLODipine (NORVASC) 10 MG tablet Take 1 tablet (10 mg total) by mouth daily. (Patient not taking: Reported on 11/15/2015) 90 tablet 3  . aspirin EC 325 MG EC tablet Take 1 tablet (325 mg total) by mouth 2 (two) times daily after a meal. (Patient not taking: Reported on 05/19/2017) 30 tablet 0  . HYDROcodone-acetaminophen (NORCO/VICODIN) 5-325 MG tablet Take 1 tablet  by mouth every 4 (four) hours as needed for moderate pain (Must last 14 days.Do not take and drive a car or use machinery.). (Patient not  taking: Reported on 05/19/2017) 60 tablet 0  . lisinopril (PRINIVIL,ZESTRIL) 40 MG tablet Take 1 tablet (40 mg total) by mouth daily. (Patient not taking: Reported on 05/19/2017) 90 tablet 3  . omeprazole (PRILOSEC) 20 MG capsule Take 1 capsule (20 mg total) by mouth daily. (Patient not taking: Reported on 05/19/2017) 30 capsule 0  . oxyCODONE-acetaminophen (ROXICET) 5-325 MG tablet Take 1-2 tablets by mouth every 4 (four) hours as needed. (Patient not taking: Reported on 05/19/2017) 60 tablet 0  . sucralfate (CARAFATE) 1 g tablet Take 1 tablet (1 g total) by mouth 4 (four) times daily -  with meals and at bedtime. 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  Transfers Overall transfer level: Needs assistance Equipment used: 2 person hand held assist Transfers: Sit to/from Stand Sit to Stand: Min assist, +2 physical assistance, +2 safety/equipment General transfer comment: Min assist as pt with posterior LOB.  Ambulation/Gait Ambulation/Gait assistance: Min assist, Mod assist, +2 physical assistance, +2 safety/equipment Ambulation Distance (Feet): 450 Feet Assistive device: 2 person hand held assist Gait Pattern/deviations: Step-through pattern, Narrow base of support, Antalgic, Staggering left, Staggering right General Gait Details: pt with multiple LOB, especially when asked to process simultaneosly. Pt very distracted  by items in environment. Min A 75% of time but lost balance to both sides multiple times with mod A to correct Gait velocity: decreased Gait velocity interpretation: Below normal speed for age/gender    ADL: ADL Overall ADL's : Needs assistance/impaired Eating/Feeding: Supervision/ safety, Sitting Grooming: Supervision/safety, Sitting Upper Body Bathing: Sitting, Min guard Lower Body Bathing: Minimal assistance, Sit to/from stand Upper Body Dressing : Min guard, Sitting Lower Body Dressing: Minimal assistance, Sit to/from stand Toilet Transfer: +2 for physical assistance, Ambulation, Moderate assistance Toilet Transfer Details (indicate cue type and reason): Up to mod assist to correct multiple LOB Toileting- Clothing Manipulation and Hygiene: Minimal assistance, Sit to/from stand Functional mobility during ADLs: Minimal assistance, +2 for safety/equipment, +2 for physical assistance General ADL Comments: Pt with multiple cognitive deficits and poor balance impacting his ability to participate in ADL.  Cognition: Cognition Overall Cognitive Status: Impaired/Different from baseline Orientation Level: Oriented to person, Oriented to place Cognition Arousal/Alertness: Awake/alert Behavior During Therapy: Impulsive Overall Cognitive Status: Impaired/Different from baseline Area of Impairment: Attention, Memory, Orientation, Following commands, Safety/judgement, Awareness, Problem solving Orientation Level: Disoriented to, Time, Situation Current Attention Level: Sustained Memory: Decreased short-term memory, Decreased recall of precautions Following Commands: Follows one step commands consistently, Follows one step commands with increased time Safety/Judgement: Decreased awareness of safety, Decreased awareness of deficits Awareness: Intellectual Problem Solving: Slow processing, Requires verbal cues General Comments: Pt reporting that it is 2099. Pt with decreased ability to attend  to tasks in distracting hallway environment. Able to initiate cognitive tasks while ambulating but "falls off" accuracy after a few minutes. Unable  to complete serial 7's.   Blood pressure (!) 162/114, pulse 70, temperature (!) 97.5 F (36.4 C), temperature source Oral, resp. rate (!) 22, height 5\' 10"  (1.778 m), weight 72.9 kg (160 lb 11.4 oz), SpO2 95 %. Physical Exam  Vitals reviewed. Constitutional: He is oriented to person, place, and time. He appears well-developed and well-nourished.  47 year old right-handed male sitting up in chair  HENT:  Head: Normocephalic and atraumatic.  Eyes: EOM are normal. Right eye exhibits no discharge. Left eye exhibits no discharge.  Neck: Normal range of motion. Neck supple. No thyromegaly present.  Cardiovascular: Normal rate and regular rhythm.  Respiratory: Effort normal. No respiratory distress.  Upper airway sounds  GI: Soft. Bowel sounds are normal. He exhibits no distension.  Musculoskeletal:  No edema or tenderness in extremities  Neurological: He is alert and oriented to person, place, and time.  Pleasantly confused. Limited awareness of deficits.  He does follow simple commands Motor: B/l UE: 5/5 proximal to distal B/l LE: HF: 4-/5, KE 4-/5, ADF/PF 4/5 (right stronger than left) Dysarthria  Skin: Skin is warm and dry.  B/l knees with ulcers  Psychiatric: His mood appears anxious. His speech is tangential. He is hyperactive. He expresses impulsivity.    Results for orders placed or performed during the hospital encounter of 05/19/17 (from the past 24 hour(s))  Magnesium     Status: None   Collection Time: 05/26/17  3:09 AM  Result Value Ref Range   Magnesium 2.1 1.7 - 2.4 mg/dL  Basic metabolic panel     Status: Abnormal   Collection Time: 05/26/17  3:09 AM  Result Value Ref Range   Sodium 136 135 - 145 mmol/L   Potassium 3.4 (L) 3.5 - 5.1 mmol/L   Chloride 103 101 - 111 mmol/L   CO2 18 (L) 22 - 32 mmol/L   Glucose, Bld 88 65 -  99 mg/dL   BUN 16 6 - 20 mg/dL   Creatinine, Ser 1.61 0.61 - 1.24 mg/dL   Calcium 8.7 (L) 8.9 - 10.3 mg/dL   GFR calc non Af Amer >60 >60 mL/min   GFR calc Af Amer >60 >60 mL/min   Anion gap 15 5 - 15   No results found.  Assessment/Plan: Diagnosis: Encephalopathy vs. Anoxic BI Labs independently reviewed.  Records reviewed and summated above.  1. Does the need for close, 24 hr/day medical supervision in concert with the patient's rehab needs make it unreasonable for this patient to be served in a less intensive setting? Potentially  2. Co-Morbidities requiring supervision/potential complications: Dysphagia (advance diet as tolerated), ejection fraction 25% (Monitor in accordance with increased physical activity and avoid UE resistance excercises), Alcohol abuse (counsel), CAD s/p MI (cont meds), bilateral total hip arthroplasty, grade 1 diastolic dysfunction (monitor for sign and symptoms of fluid overload), HTN (monitor and provide prns in accordance with increased physical exertion and pain), tobacco abuse (counsel), agitation (conservative management when possible), hypokalemia (continue to monitor and replete as necessary) 3. Due to safety, skin/wound care, disease management, medication administration and patient education, does the patient require 24 hr/day rehab nursing? Yes 4. Does the patient require coordinated care of a physician, rehab nurse, PT (1-2 hrs/day, 5 days/week), OT (1-2 hrs/day, 5 days/week) and SLP (1-2 hrs/day, 5 days/week) to address physical and functional deficits in the context of the above medical diagnosis(es)? Potentially Addressing deficits in the following areas: balance, endurance, locomotion, strength, transferring, bathing, dressing, toileting, cognition, speech, language, swallowing and psychosocial support 5.  Can the patient actively participate in an intensive therapy program of at least 3 hrs of therapy per day at least 5 days per week? Yes 6. The  potential for patient to make measurable gains while on inpatient rehab is good 7. Anticipated functional outcomes upon discharge from inpatient rehab are supervision and min assist  with PT, supervision and min assist with OT, supervision with SLP. 8. Estimated rehab length of stay to reach the above functional goals is: 12-16 days. 9. Anticipated D/C setting: Home 10. Anticipated post D/C treatments: HH therapy and Home excercise program 11. Overall Rehab/Functional Prognosis: good  RECOMMENDATIONS: This patient's condition is appropriate for continued rehabilitative care in the following setting: Pt doing well functionally with predominant cognitive deficits.  Will folllow for progress.  Further, pt refusing CIR at this time.  Will follow up if patient changes his mind. Patient has agreed to participate in recommended program. No Note that insurance prior authorization may be required for reimbursement for recommended care.  Comment: Rehab Admissions Coordinator to follow up.  Maryla Morrow, MD, ABPMR Mcarthur Rossetti Angiulli, PA-C 05/26/2017
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
## 2017-05-26 NOTE — Progress Notes (Signed)
Patient transferred to 3E18. Wife notified of patients new room.  Delories Heinz, RN

## 2017-05-26 NOTE — Progress Notes (Signed)
Progress Note  Patient Name: Tony Reynolds Date of Encounter: 05/26/2017  Primary Cardiologist: No primary care provider on file.   Subjective   No chest pain or dyspnea.   Inpatient Medications    Scheduled Meds: . amiodarone  400 mg Oral BID  . amLODipine  2.5 mg Oral Daily  . aspirin  81 mg Oral Daily  . carvedilol  6.25 mg Oral BID WC  . chlorhexidine  15 mL Mouth Rinse BID  . cloNIDine  0.1 mg Transdermal Weekly  . folic acid  1 mg Intravenous Daily  . haloperidol lactate  5 mg Intravenous Once  . heparin  5,000 Units Subcutaneous Q8H  . losartan  25 mg Oral Daily  . magic mouthwash  15 mL Oral TID  . mouth rinse  15 mL Mouth Rinse q12n4p  . nicotine  21 mg Transdermal Daily  . thiamine injection  100 mg Intravenous Daily   Continuous Infusions: . sodium chloride Stopped (05/23/17 0130)   PRN Meds: acetaminophen, hydrALAZINE, Influenza vac split quadrivalent PF, LORazepam, pneumococcal 23 valent vaccine   Vital Signs    Vitals:   05/26/17 0300 05/26/17 0400 05/26/17 0500 05/26/17 0732  BP: (!) 150/97 124/72 (!) 162/96 (!) 162/114  Pulse: 72   70  Resp: 19 20 (!) 22   Temp: 97.7 F (36.5 C)   (!) 97.5 F (36.4 C)  TempSrc: Oral   Oral  SpO2:      Weight:   160 lb 11.4 oz (72.9 kg)   Height:        Intake/Output Summary (Last 24 hours) at 05/26/2017 1001 Last data filed at 05/26/2017 0400 Gross per 24 hour  Intake 480 ml  Output 602 ml  Net -122 ml   Filed Weights   05/24/17 0500 05/25/17 0500 05/26/17 0500  Weight: 158 lb 11.7 oz (72 kg) 153 lb 3.5 oz (69.5 kg) 160 lb 11.4 oz (72.9 kg)    Telemetry    Sinus - Personally Reviewed  ECG   no am ekg  Physical Exam   GEN: No acute distress.   Neck: No JVD Cardiac: RRR, no murmurs, rubs, or gallops.  Respiratory: Clear to auscultation bilaterally. GI: Soft, nontender, non-distended  EXT: No LE edema; bandages over both knees Neuro:  Nonfocal  Psych: Normal affect   Labs     Chemistry Recent Labs  Lab 05/20/17 0318  05/23/17 0323 05/24/17 0314 05/26/17 0309  NA 136   < > 139 137 136  K 4.6   < > 3.2* 3.6 3.4*  CL 109   < > 104 105 103  CO2 19*   < > 24 25 18*  GLUCOSE 112*   < > 86 113* 88  BUN 13   < > 14 9 16   CREATININE 0.79   < > 1.03 0.94 0.96  CALCIUM 7.6*   < > 8.3* 8.9 8.7*  ALBUMIN 3.0*  --   --   --   --   GFRNONAA >60   < > >60 >60 >60  GFRAA >60   < > >60 >60 >60  ANIONGAP 8   < > 11 7 15    < > = values in this interval not displayed.     Hematology Recent Labs  Lab 05/21/17 0428 05/22/17 0345 05/24/17 0314  WBC 10.1 7.9 8.2  RBC 4.45 3.96* 4.59  HGB 12.9* 11.8* 13.6  HCT 38.1* 34.4* 39.3  MCV 85.6 86.9 85.6  MCH 29.0 29.8 29.6  MCHC 33.9 34.3 34.6  RDW 14.3 13.8 13.4  PLT 140* 132* 159    Cardiac Enzymes Recent Labs  Lab 05/19/17 1552  TROPONINI 25.35*   No results for input(s): TROPIPOC in the last 168 hours.   BNPNo results for input(s): BNP, PROBNP in the last 168 hours.   DDimer No results for input(s): DDIMER in the last 168 hours.   Radiology    No results found.  Cardiac Studies   Echo 05/20/17: - Left ventricle: The cavity size was normal. There was moderate   concentric hypertrophy. Systolic function was severely reduced.   The estimated ejection fraction was in the range of 20% to 25%.   Diffuse hypokinesis. Features are consistent with a pseudonormal   left ventricular filling pattern, with concomitant abnormal   relaxation and increased filling pressure (grade 2 diastolic   dysfunction). Doppler parameters are consistent with elevated   ventricular end-diastolic filling pressure. - Aortic valve: There was no regurgitation. - Aortic root: The aortic root was normal in size. - Mitral valve: There was mild regurgitation. - Left atrium: The atrium was mildly dilated. - Right ventricle: Systolic function was normal. - Right atrium: The atrium was normal in size. - Tricuspid valve: There was mild  regurgitation. - Pulmonic valve: There was no regurgitation. - Pulmonary arteries: Systolic pressure was mildly increased. PA   peak pressure: 39 mm Hg (S). - Inferior vena cava: The vessel was dilated. The respirophasic   diameter changes were blunted (< 50%), consistent with elevated   central venous pressure. - Pericardium, extracardiac: There was no pericardial effusion.  Impressions:  - When compared to the prior study from 08/02/2015 LVEF has   significantly decreased from 60-65% to 20-25% with diffuse   hypokinesis.  Cardiac cath 05/19/17: 1. Mild-moderate proximal LAD stenosis does not appear flow-limiting 2. Severe diagonal stenosis involving a medium caliber first diagonal with TIMI-3 flow 3. Angiographically normal left main, LCx, and RCA 4. Moderately severe segmental LV systolic dysfunction with severely elevated LVEDP  Patient Profile     47 y.o. male admitted with cardiac arrest 05/19/16. Cardiac cath 05/19/16 with non-obstructive disease in the LAD and Diagonal.   Assessment & Plan    1. Out of hospital cardiac arrest: He has severe LV systolic dysfunction and evidence of VT during hospitalization. He has been seen by Dr. Graciela Husbands on our EP team for the past 4 days. He will need an ICD but he has skin lesions currently and Dr. Graciela Husbands would like for these to be healed before implanting an ICD. Plans in place for Lifevest at time of discharge. Will continue amiodarone.   2. Non-ischemic cardiomyopathy: LVEF=20-25% by echo 05/20/16. No obstructive CAD noted on cath. Continue Coreg and Losartan. Will tirate Coreg for better BP control.   3. CAD: He has moderate non-obstructive CAD by cath 05/19/16. Will continue medical management with ASA and beta blocker. Will start a statin.   For questions or updates, please contact [facility] Please consult [email or web address] for contact info under Cardiology/STEMI.      Signed, Verne Carrow, MD  05/26/2017, 10:01 AM
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
## 2017-05-27 LAB — BASIC METABOLIC PANEL
Anion gap: 8 (ref 5–15)
BUN: 11 mg/dL (ref 6–20)
CHLORIDE: 104 mmol/L (ref 101–111)
CO2: 26 mmol/L (ref 22–32)
CREATININE: 1 mg/dL (ref 0.61–1.24)
Calcium: 8.8 mg/dL — ABNORMAL LOW (ref 8.9–10.3)
GFR calc Af Amer: >60 mL/min (ref >60)
GFR calc non Af Amer: >60 mL/min (ref >60)
GLUCOSE: 85 mg/dL (ref 65–99)
Potassium: 3.6 mmol/L (ref 3.5–5.1)
Sodium: 138 mmol/L (ref 135–145)

## 2017-05-27 LAB — MAGNESIUM: Magnesium: 2.2 mg/dL (ref 1.7–2.4)

## 2017-05-27 MED ORDER — POTASSIUM CHLORIDE CRYS ER 20 MEQ PO TBCR
40.0000 meq | EXTENDED_RELEASE_TABLET | Freq: Once | ORAL | Status: AC
  Administered 2017-05-27: 40 meq via ORAL
  Filled 2017-05-27: qty 2

## 2017-05-27 MED ORDER — ENSURE ENLIVE PO LIQD
237.0000 mL | Freq: Two times a day (BID) | ORAL | Status: DC
  Administered 2017-05-27 – 2017-05-28 (×4): 237 mL via ORAL

## 2017-05-27 MED ORDER — ADULT MULTIVITAMIN W/MINERALS CH
1.0000 | ORAL_TABLET | Freq: Every day | ORAL | Status: DC
  Administered 2017-05-28: 1 via ORAL
  Filled 2017-05-27: qty 1

## 2017-05-27 NOTE — Progress Notes (Signed)
PROGRESS NOTE  Tony Reynolds LPF:[id number] DOB: 07/25/1970 DOA: 05/19/2017 PCP: Celene Squibb, MD  HPI/Recap of past 24 hours: Tony Reynolds is a 47 y.o. male with a history of HTN, tobacco use, and LVH who experienced cardiac arrest at home with at least 18 minutes of no CPR support. He was intubated and admitted to the ICU with urgent catheterization showing minimal occlusive disease. He experienced repeat VFib arrest on the cath table with ROSC after 1 shock. Arrhythmias continued with EP following. Plan was for ICD though bullous skin lesions on legs has caused delay, so pt will have lifevest applied. Therapy evaluation has occurred, recommending CIR who is consulted. The patient is suspected to have had anoxic brain injury and lacks capacity for medical decision making, therefore IVC was filed 1/6.  Today noted to be more calm as compared to overnight and yesterday of which he had to be restrained due to severe agitation. Pt was met resting, alert, but refused to answer my questions. Looked comfortable, not in any distress.  Assessment/Plan: Principal Problem:   Agitation Active Problems:   Cardiac arrest (HCC)   Acute myocardial infarction (HCC)   Endotracheally intubated   Dysphagia   Acute systolic congestive heart failure (HCC)   ETOH abuse   Coronary artery disease involving native coronary artery of native heart without angina pectoris   Acute combined systolic and diastolic congestive heart failure (HCC)   Tobacco abuse   Benign essential HTN   Hypokalemia  S/P out of hospital cardiac arrest No CPR at scene, ROSC per EMS after at least 18 minutes -Severe LV systolic dysfxn with evidence of VT -Plans for ICD, but due to skin lesions, will be postponed till they heal - EP consulted, on amiodarone - LifeVest placement upon discharge and follow up arranged per EP  NICM, HTN, non-obstructive CAD  EF 20-25% by echo, 35-40% by cath.  - Antihypertensives per  EP/cardiology - Continue Coreg, amlodipine, aspirin, clonidine, losartan, statin  QT prolongation - Would avoid provocative medications, including antipsychotics, at all cost.   Hypokalemia - Replace and continue daily monitoring w/Mg.   Anoxic brain injury Pt does not seem to have capacity to make decision at this time. IVC filed 1/6. Remains confused - Continue to monitor and treat supportively. prn Ativan ordered. Sitter ordered.  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 Uncertain etiology. No mucous membrane involvement.  - Keep ruptured bullae covered, no evidence of infection. WOC consulted, recommended xeroform gauze.  - Monitor. Consider further work up/dermatology referral if worsens  Tobacco use  - Nicotine patch 82m  Right leg swelling Mild, noted 1/8  - RLE U/S doppler negative for DVT   Code Status: Full  Family Communication: None at bedside  Disposition Plan: CIR   Consultants:  Cardiology  Procedures:  Cardiac cath  Antimicrobials:  None  DVT prophylaxis:  Lovenox   Objective: Vitals:   05/27/17 0639 05/27/17 1200 05/27/17 1600 05/27/17 1649  BP: (!) 148/87 123/73 120/66 120/66  Pulse: 64 60 69 69  Resp: '18 18 18   ' Temp: 98.4 F (36.9 C) 98 F (36.7 C) 97.9 F (36.6 C)   TempSrc: Axillary Axillary Axillary   SpO2: 98% 98% 97%   Weight:      Height:        Intake/Output Summary (Last 24 hours) at 05/27/2017 1905 Last data filed at 05/27/2017 1500 Gross per 24 hour  Intake 974 ml  Output 600 ml  Net 374 ml   Filed Weights   05/26/17 0500 05/26/17 1211 05/27/17 0500  Weight: 72.9 kg (160 lb 11.4 oz) 69.7 kg (153 lb 9.6 oz) 68.9 kg (151 lb 14.4 oz)    Exam:   General:  Alert, awake, not oriented  Cardiovascular: S1,S2 present, no added hrt sound  Respiratory: Chest clear bilaterally   Abdomen: Soft, non-tender, non-distended, BS  present  Musculoskeletal: No pedal edema  Skin: Nonerythematous bullae on left anterior shin and knee with unroofed blister w/foam dressing covering healthy full thickness wound. RLE also w/bullae covered by dressings. No abscess or purulence or excoriations  Psychiatry: Calm, poor judgement and insight   Data Reviewed: CBC: Recent Labs  Lab 05/21/17 0428 05/22/17 0345 05/24/17 0314  WBC 10.1 7.9 8.2  HGB 12.9* 11.8* 13.6  HCT 38.1* 34.4* 39.3  MCV 85.6 86.9 85.6  PLT 140* 132* 725   Basic Metabolic Panel: Recent Labs  Lab 05/22/17 0345 05/23/17 0323 05/24/17 0314 05/25/17 0258 05/26/17 0309 05/27/17 0648  NA 138 139 137  --  136 138  K 3.4* 3.2* 3.6  --  3.4* 3.6  CL 104 104 105  --  103 104  CO2 '27 24 25  ' --  18* 26  GLUCOSE 96 86 113*  --  88 85  BUN '16 14 9  ' --  16 11  CREATININE 1.00 1.03 0.94  --  0.96 1.00  CALCIUM 7.9* 8.3* 8.9  --  8.7* 8.8*  MG 2.3 2.4 2.2 2.2 2.1 2.2   GFR: Estimated Creatinine Clearance: 91.9 mL/min (by C-G formula based on SCr of 1 mg/dL). Liver Function Tests: No results for input(s): AST, ALT, ALKPHOS, BILITOT, PROT, ALBUMIN in the last 168 hours. No results for input(s): LIPASE, AMYLASE in the last 168 hours. No results for input(s): AMMONIA in the last 168 hours. Coagulation Profile: No results for input(s): INR, PROTIME in the last 168 hours. Cardiac Enzymes: No results for input(s): CKTOTAL, CKMB, CKMBINDEX, TROPONINI in the last 168 hours. BNP (last 3 results) No results for input(s): PROBNP in the last 8760 hours. HbA1C: No results for input(s): HGBA1C in the last 72 hours. CBG: Recent Labs  Lab 05/22/17 2011 05/22/17 2314 05/23/17 0418 05/23/17 0819 05/23/17 1136  GLUCAP 97 81 76 132* 132*   Lipid Profile: No results for input(s): CHOL, HDL, LDLCALC, TRIG, CHOLHDL, LDLDIRECT in the last 72 hours. Thyroid Function Tests: No results for input(s): TSH, T4TOTAL, FREET4, T3FREE, THYROIDAB in the last 72  hours. Anemia Panel: No results for input(s): VITAMINB12, FOLATE, FERRITIN, TIBC, IRON, RETICCTPCT in the last 72 hours. Urine analysis:    Component Value Date/Time   COLORURINE YELLOW 05/19/2017 0343   APPEARANCEUR CLOUDY (A) 05/19/2017 0343   LABSPEC 1.039 (H) 05/19/2017 0343   PHURINE 5.0 05/19/2017 0343   GLUCOSEU 50 (A) 05/19/2017 0343   HGBUR MODERATE (A) 05/19/2017 0343   BILIRUBINUR NEGATIVE 05/19/2017 0343   KETONESUR NEGATIVE 05/19/2017 0343   PROTEINUR 100 (A) 05/19/2017 0343   UROBILINOGEN 0.2 02/16/2014 0820   NITRITE NEGATIVE 05/19/2017 0343   LEUKOCYTESUR NEGATIVE 05/19/2017 0343   Sepsis Labs: '@LABRCNTIP' (procalcitonin:4,lacticidven:4)  ) Recent Results (from the past 240 hour(s))  MRSA PCR Screening     Status: None   Collection Time: 05/19/17  3:38 AM  Result Value Ref Range Status   MRSA by PCR NEGATIVE NEGATIVE Final    Comment:        The GeneXpert MRSA  Assay (FDA approved for NASAL specimens only), is one component of a comprehensive MRSA colonization surveillance program. It is not intended to diagnose MRSA infection nor to guide or monitor treatment for MRSA infections.       Studies: No results found.  Scheduled Meds: . amiodarone  400 mg Oral BID  . amLODipine  2.5 mg Oral Daily  . aspirin  81 mg Oral Daily  . atorvastatin  20 mg Oral q1800  . carvedilol  12.5 mg Oral BID WC  . chlorhexidine  15 mL Mouth Rinse BID  . cloNIDine  0.1 mg Transdermal Weekly  . divalproex  250 mg Oral Q12H  . feeding supplement (ENSURE ENLIVE)  237 mL Oral BID BM  . folic acid  1 mg Intravenous Daily  . heparin  5,000 Units Subcutaneous Q8H  . losartan  25 mg Oral Daily  . magic mouthwash  15 mL Oral TID  . mouth rinse  15 mL Mouth Rinse q12n4p  . multivitamin with minerals  1 tablet Oral Daily  . nicotine  21 mg Transdermal Daily  . thiamine injection  100 mg Intravenous Daily    Continuous Infusions: . sodium chloride Stopped (05/23/17 0130)      LOS: 8 days     Alma Friendly, MD [facility]   If 7PM-7AM, please contact night-coverage [email or web address] Password [email or web address] 05/27/2017, 7:05 PM

## 2017-05-27 NOTE — Progress Notes (Addendum)
  Speech Language Pathology Treatment: Dysphagia  Patient Details Name: Tony Reynolds MRN: [id number] DOB: 1971-02-13 Today's Date: 05/27/2017 Time: 3343-5686 SLP Time Calculation (min) (ACUTE ONLY): 28 min  Assessment / Plan / Recommendation Clinical Impression  Pt participated in skilled treatment with differential diagnosis with consumption of various consistencies with initial portion of PO intake requiring min-mod verbal cues d/t decreased LOA with oral holding and increased bolus transit time and one incidence of immediate cough after increased volume of thin liquids via straw; eliminated with min verbal cues to take smaller sips with straw d/t impulsivity; pt oriented to self only at this time; confusion persists as he often asked his wife "When are we leaving here?"; educated pt/wife re: safety during consumption of liquids/food with decreased bite size/sips, eliminating distractions and increased LOA while consuming PO's; continue Dysphagia 2 (fine/chopped) with thin liquids via cup/straw with full supervision for consumption d/t aspiration risk d/t cognitive deficits/impulsivity; recommend SLE to assess language/cognition; ST will continue to f/u for SLE/diet upgrade as pt's mentation permits.  HPI HPI: Patient is a 47 year old male with past medical history of hypertension, current smoker, LVH presenting with cardiac arrest from home. He had no bystander CPR per accounts from the chart and family initial rhythm V. fib duration of code 18 minutes patient intubated EKG showing ST-T wave changes cardiology consulted taken to Cath Lab. Catheterization showed minimal occlusive disease nothing requiring intervention. However, patient had another V. fib episode on cath table and received defibrillation x1. Upon arrival to to heart patient continued to have bigeminy trigeminy required amiodarone bolus followed by amio Infusion followed by mag followed by calcium followed by lidocaine infusion.       SLP Plan  Continue with current plan of care       Recommendations  Diet recommendations: Dysphagia 2 (fine chop);Thin liquid Liquids provided via: Cup;Straw Medication Administration: Whole meds with puree Supervision: Staff to assist with self feeding;Full supervision/cueing for compensatory strategies Compensations: Minimize environmental distractions;Slow rate;Small sips/bites Postural Changes and/or Swallow Maneuvers: Seated upright 90 degrees                Oral Care Recommendations: Oral care BID Follow up Recommendations: Inpatient Rehab SLP Visit Diagnosis: Dysphagia, unspecified (R13.10) Plan: Continue with current plan of care                       Tressie Stalker, M.S., CCC-SLP 05/27/2017, 3:45 PM

## 2017-05-27 NOTE — Progress Notes (Signed)
Nutrition Follow-up  DOCUMENTATION CODES:   Not applicable  INTERVENTION:   -MVI daily -Ensure Enlive po BID, each supplement provides 350 kcal and 20 grams of protein  NUTRITION DIAGNOSIS:   Predicted suboptimal nutrient intake related to lethargy/confusion as evidenced by (agitation, variable meal intake (PO: 10-75%)).  [place]  GOAL:   Patient will meet greater than or equal to 90% of their needs  Progressing  MONITOR:   PO intake, Supplement acceptance, Diet advancement, Labs, Weight trends, Skin, I & O's  REASON FOR ASSESSMENT:   Ventilator    ASSESSMENT:   36 yoM w/PMH of LVH and HTN with vfib cardiac arrest from home.  No bystander cpr. Did require sedation, but unknown if movement was purposeful.  Taken for emergency LHC with minimal nonobstructive disease, EF 35%.  Vfib arrest while transferring from cath table to stretcher and additionally on arrival to ICU.  1/3- extubated 1/4- s/p BSE- advanced tio dysphagia 1 diet with thin liquids 1/7- s/p BSE, advanced to dysphagia 2 diet with thin liquids 1/8- transferred from ICU to floor, restraints added due to agitation   Pt lying in bed; meal tray being set up with assistance of wife and sitter.   Meal completion variable; noted PO 10-75%. Pt's agitation may impact ability to consume meals, so RD will order supplement to assist with nutritional adequacy.   Unable to complete Nutrition-Focused physical exam at this time.   Labs reviewed.   Diet Order:  DIET DYS 2 Room service appropriate? Yes; Fluid consistency: Thin  EDUCATION NEEDS:   Not appropriate for education at this time  Skin:  Skin Assessment: Skin Integrity Issues: Skin Integrity Issues:: Other (Comment) Other: full thickness wounds to end of penis, lt knee, rt knee  Last BM:  05/26/17  Height:   Ht Readings from Last 1 Encounters:  05/26/17 5\' 10"  (1.778 m)    Weight:   Wt Readings from Last 1 Encounters:  05/27/17 151 lb 14.4 oz (68.9  kg)    Ideal Body Weight:  75.4 kg  BMI:  Body mass index is 21.79 kg/m.  Estimated Nutritional Needs:   Kcal:  2050-2250  Protein:  105-120 grams  Fluid:  > 2.0 L    Carlton Sweaney A. Mayford Knife, RD, LDN, CDE Pager: [phone] After hours Pager: [phone]

## 2017-05-27 NOTE — Progress Notes (Signed)
I met with pt's wife at bedside. Pt sleeping soundly, restrained, with sitter at bedside. RN also in to see pt. I discussed with wife strategies to assist with keeping pt calm as he is easily agitated. Pt has been incarcerated for 12 years until the age of 47 years old. He has verbally made statements to wife over the last few days as he felt he "had not done anything wrong", why are they tying him down, etc. He does not understand that he is hospitalized at this time per wife. Pt functionally moving well with therapy with predominate cognitive deficits . Not a candidate for an inpt rehab admit at this time. I will follow his progress over the next several days as medication changes recommended by psychiatry take effect. [phone]

## 2017-05-27 NOTE — Progress Notes (Signed)
Patient agitated required security to place restraints.

## 2017-05-27 NOTE — Progress Notes (Signed)
Physical Therapy Treatment Patient Details Name: Tony Reynolds MRN: [id number] DOB: 04-29-1971 Today's Date: 05/27/2017    History of Present Illness Patient is a 47 year old African American male with past medical history significant for B THA, hypertension, current smoker, and LVH. Patient was admitted with cardiac arrest from home. Patient was initially intubated and admitted to ICU Team. Initial EKG showed ST-T wave changes, Cardiology was consulted and patient underwent cardiac catheterization.  Catheterization showed minimal occlusive disease, but nothing requiring intervention. Patient was reported to have had another V. fib episode on cath table and received defibrillation x 1.  Post Cath, patient continued to have bigeminy and trigeminy that required amiodarone bolus followed by amio Infusion followed by mag followed by calcium followed by lidocaine infusion.     PT Comments    Pt minimally verbal through out, following simple instruction with extra processing time.  Emphasis on gait stability, assisting pt to stay on task and focus within task.    Follow Up Recommendations  CIR     Equipment Recommendations  Other (comment)    Recommendations for Other Services       Precautions / Restrictions Restrictions Weight Bearing Restrictions: No    Mobility  Bed Mobility                  Transfers Overall transfer level: Needs assistance Equipment used: Rolling walker (2 wheeled) Transfers: Sit to/from Stand Sit to Stand: Min assist            Ambulation/Gait Ambulation/Gait assistance: Min assist;Min guard;+2 safety/equipment Ambulation Distance (Feet): 170 Feet Assistive device: Rolling walker (2 wheeled) Gait Pattern/deviations: Step-through pattern;Decreased step length - right;Decreased step length - left;Decreased dorsiflexion - right Gait velocity: decreased Gait velocity interpretation: Below normal speed for age/gender General Gait Details: heavy  use of RW, unsteady gait overall, but no LOB due to heavy use of the RW.  More shuffled steps as he fatigued.  Frequently losing focus and running into walls and objects on the Left.   Stairs            Wheelchair Mobility    Modified Rankin (Stroke Patients Only)       Balance Overall balance assessment: Needs assistance Sitting-balance support: No upper extremity supported Sitting balance-Leahy Scale: Good     Standing balance support: During functional activity;Single extremity supported Standing balance-Leahy Scale: Poor                              Cognition Arousal/Alertness: Awake/alert Behavior During Therapy: Impulsive Overall Cognitive Status: Impaired/Different from baseline Area of Impairment: Attention                   Current Attention Level: Sustained   Following Commands: Follows one step commands with increased time;Follows one step commands consistently Safety/Judgement: Decreased awareness of safety;Decreased awareness of deficits Awareness: Intellectual Problem Solving: Slow processing        Exercises      General Comments General comments (skin integrity, edema, etc.): vss      Pertinent Vitals/Pain Pain Assessment: Faces Faces Pain Scale: Hurts little more Pain Location: RLE Pain Descriptors / Indicators: Grimacing;Guarding;Moaning Pain Intervention(s): Monitored during session    Home Living                      Prior Function            PT Goals (current goals can now  be found in the care plan section) Acute Rehab PT Goals Patient Stated Goal: go outside PT Goal Formulation: With patient Time For Goal Achievement: 06/08/17 Potential to Achieve Goals: Good Progress towards PT goals: Progressing toward goals    Frequency    Min 3X/week      PT Plan Current plan remains appropriate    Co-evaluation              AM-PAC PT "6 Clicks" Daily Activity  Outcome Measure  Difficulty  turning over in bed (including adjusting bedclothes, sheets and blankets)?: A Little Difficulty moving from lying on back to sitting on the side of the bed? : A Little Difficulty sitting down on and standing up from a chair with arms (e.g., wheelchair, bedside commode, etc,.)?: A Little Help needed moving to and from a bed to chair (including a wheelchair)?: A Little Help needed walking in hospital room?: A Little Help needed climbing 3-5 steps with a railing? : A Little 6 Click Score: 18    End of Session   Activity Tolerance: Patient tolerated treatment well Patient left: in bed;with call bell/phone within reach;with restraints reapplied;with family/visitor present Nurse Communication: Mobility status PT Visit Diagnosis: Unsteadiness on feet (R26.81);Difficulty in walking, not elsewhere classified (R26.2)     Time: 1100-1120 PT Time Calculation (min) (ACUTE ONLY): 20 min  Charges:  $Gait Training: 8-22 mins                    G Codes:       Jun 23, 2017  Piney Bing, PT [phone]  (pager)   Eliseo Gum Jasmon Mattice 06/23/17, 12:45 PM

## 2017-05-27 NOTE — Progress Notes (Signed)
Pt is up walking with therapy, Wife at bedside for support and he acknowledges her as wife no distress or combativeness. Has discomfort while walking on knees. Refused pain Med at this time. Continue restraints due to confusion wanting IV out and  to go to work and does not understand that he is in the hospital and under Doctors care. Reorienting every 2 hours.

## 2017-05-27 NOTE — Progress Notes (Signed)
Progress Note  Patient Name: Tony Reynolds Date of Encounter: 05/27/2017  Primary Cardiologist: Paulene Tayag/Klein  Subjective   Just finished walking in the hallway, now tired.   Inpatient Medications    Scheduled Meds: . amiodarone  400 mg Oral BID  . amLODipine  2.5 mg Oral Daily  . aspirin  81 mg Oral Daily  . atorvastatin  20 mg Oral q1800  . carvedilol  12.5 mg Oral BID WC  . chlorhexidine  15 mL Mouth Rinse BID  . cloNIDine  0.1 mg Transdermal Weekly  . divalproex  250 mg Oral Q12H  . feeding supplement (ENSURE ENLIVE)  237 mL Oral BID BM  . folic acid  1 mg Intravenous Daily  . heparin  5,000 Units Subcutaneous Q8H  . losartan  25 mg Oral Daily  . magic mouthwash  15 mL Oral TID  . mouth rinse  15 mL Mouth Rinse q12n4p  . multivitamin with minerals  1 tablet Oral Daily  . nicotine  21 mg Transdermal Daily  . thiamine injection  100 mg Intravenous Daily   Continuous Infusions: . sodium chloride Stopped (05/23/17 0130)   PRN Meds: acetaminophen, hydrALAZINE, Influenza vac split quadrivalent PF, LORazepam, pneumococcal 23 valent vaccine, white petrolatum   Vital Signs    Vitals:   05/26/17 1035 05/26/17 1211 05/27/17 0500 05/27/17 0639  BP: (!) 110/99 (!) 141/88  (!) 148/87  Pulse:  81  64  Resp: (!) 23 18  18   Temp:  98.6 F (37 C)  98.4 F (36.9 C)  TempSrc:  Oral  Axillary  SpO2:  100%  98%  Weight:  153 lb 9.6 oz (69.7 kg) 151 lb 14.4 oz (68.9 kg)   Height:  5\' 10"  (1.778 m)      Intake/Output Summary (Last 24 hours) at 05/27/2017 1143 Last data filed at 05/27/2017 1100 Gross per 24 hour  Intake 720 ml  Output 600 ml  Net 120 ml   Filed Weights   05/26/17 0500 05/26/17 1211 05/27/17 0500  Weight: 160 lb 11.4 oz (72.9 kg) 153 lb 9.6 oz (69.7 kg) 151 lb 14.4 oz (68.9 kg)    Telemetry    SR - Personally Reviewed  Physical Exam   General: Well developed, well nourished, male appearing in no acute distress. Head: Normocephalic, atraumatic.  Neck:  Supple without bruits, JVD. Lungs:  Resp regular and unlabored, CTA. Heart: RRR, no murmur; no rub. Abdomen: Soft, non-tender, non-distended with normoactive bowel sounds. Extremities: No clubbing, cyanosis, edema. Distal pedal pulses are 2+ bilaterally. Neuro: Alert and oriented to self and surroundings at the present. Moves all extremities spontaneously. Psych: Normal affect.  Labs    Chemistry Recent Labs  Lab 05/24/17 0314 05/26/17 0309 05/27/17 0648  NA 137 136 138  K 3.6 3.4* 3.6  CL 105 103 104  CO2 25 18* 26  GLUCOSE 113* 88 85  BUN 9 16 11   CREATININE 0.94 0.96 1.00  CALCIUM 8.9 8.7* 8.8*  GFRNONAA >60 >60 >60  GFRAA >60 >60 >60  ANIONGAP 7 15 8      Hematology Recent Labs  Lab 05/21/17 0428 05/22/17 0345 05/24/17 0314  WBC 10.1 7.9 8.2  RBC 4.45 3.96* 4.59  HGB 12.9* 11.8* 13.6  HCT 38.1* 34.4* 39.3  MCV 85.6 86.9 85.6  MCH 29.0 29.8 29.6  MCHC 33.9 34.3 34.6  RDW 14.3 13.8 13.4  PLT 140* 132* 159    Cardiac EnzymesNo results for input(s): TROPONINI in the last 168 hours.  No results for input(s): TROPIPOC in the last 168 hours.   BNPNo results for input(s): BNP, PROBNP in the last 168 hours.   DDimer No results for input(s): DDIMER in the last 168 hours.    Radiology    No results found.  Cardiac Studies   Echo 05/20/17:  - Left ventricle: The cavity size was normal. There was moderate concentric hypertrophy. Systolic function was severely reduced. The estimated ejection fraction was in the range of 20% to 25%. Diffuse hypokinesis. Features are consistent with a pseudonormal left ventricular filling pattern, with concomitant abnormal relaxation and increased filling pressure (grade 2 diastolic dysfunction). Doppler parameters are consistent with elevated ventricular end-diastolic filling pressure. - Aortic valve: There was no regurgitation. - Aortic root: The aortic root was normal in size. - Mitral valve: There was mild  regurgitation. - Left atrium: The atrium was mildly dilated. - Right ventricle: Systolic function was normal. - Right atrium: The atrium was normal in size. - Tricuspid valve: There was mild regurgitation. - Pulmonic valve: There was no regurgitation. - Pulmonary arteries: Systolic pressure was mildly increased. PA peak pressure: 39 mm Hg (S). - Inferior vena cava: The vessel was dilated. The respirophasic diameter changes were blunted (<50%), consistent with elevated central venous pressure. - Pericardium, extracardiac: There was no pericardial effusion.  Impressions:  - When compared to the prior study from 08/02/2015 LVEF has significantly decreased from 60-65% to 20-25% with diffuse hypokinesis.  Cardiac cath 05/19/17:  1. Mild-moderate proximal LAD stenosis does not appear flow-limiting 2. Severe diagonal stenosis involving a medium caliber first diagonal with TIMI-3 flow 3. Angiographically normal left main, LCx, and RCA 4. Moderately severe segmental LV systolic dysfunction with severely elevated LVEDP  Patient Profile     47 y.o. male admitted with cardiac arrest 05/19/16. Cardiac cath 05/19/16 with non-obstructive disease in the LAD and Diagonal.   Assessment & Plan    1. Out of hospital cardiac arrest: He has severe LV systolic dysfunction and evidence of VT during hospitalization. He has been seen by Dr. Graciela Husbands with our EP team with plans for ICD, but has skin lesions currently and Dr. Graciela Husbands would like for these to be healed before implanting an ICD.  -- plan for Lifevest at the time of DC -- remains on amiodarone 400mg  BID  2. Non-ischemic cardiomyopathy: LVEF=20-25% by echo 05/20/16. No obstructive CAD noted on cath.  -- Continue Coreg and Losartan.   3. CAD: He has moderate non-obstructive CAD by cath 05/19/16.  -- plan for medical management with ASA and beta blocker. Statin added yesterday  4. Anoxic brain injury: He remains confused and in restraints.  Needs continual reorientation. Worked with PT today using walker. Plans for rehab at discharge per primary.  Signed, Laverda Page, NP  05/27/2017, 11:43 AM  Pager # [phone]   For questions or updates, please contact [facility] Please consult [email or web address] for contact info under Cardiology/STEMI.   Patient seen, examined. Available data reviewed. Agree with findings, assessment, and plan as outlined by Laverda Page, NP.  On exam the patient is awake and alert but not oriented.  Lungs are clear.  JVP is normal.  Heart is regular rate and rhythm with no murmur gallop.  Abdomen is soft and nontender.  Extremities show no edema.  The patient is on an appropriate medical program with amiodarone 400 mg twice daily, losartan, and carvedilol.  He will be discharged with a LifeVest.  He will require inpatient rehab at discharge.  He has  significant anoxic brain injury.  Amiodarone should be reduced to 400 mg daily after he is fully loaded with this drug.  Tonny Bollman, M.D. 05/27/2017 5:06 PM

## 2017-05-28 LAB — BASIC METABOLIC PANEL
Anion gap: 8 (ref 5–15)
BUN: 15 mg/dL (ref 6–20)
CALCIUM: 8.5 mg/dL — AB (ref 8.9–10.3)
CHLORIDE: 104 mmol/L (ref 101–111)
CO2: 26 mmol/L (ref 22–32)
CREATININE: 1.12 mg/dL (ref 0.61–1.24)
GFR calc Af Amer: >60 mL/min (ref >60)
GFR calc non Af Amer: >60 mL/min (ref >60)
GLUCOSE: 93 mg/dL (ref 65–99)
Potassium: 3.7 mmol/L (ref 3.5–5.1)
Sodium: 138 mmol/L (ref 135–145)

## 2017-05-28 LAB — MAGNESIUM: Magnesium: 2.5 mg/dL — ABNORMAL HIGH (ref 1.7–2.4)

## 2017-05-28 MED ORDER — THIAMINE HCL 100 MG PO TABS: 100.0000 mg | ORAL_TABLET | Freq: Every day | ORAL | 0 refills | Status: DC

## 2017-05-28 MED ORDER — DIVALPROEX SODIUM 250 MG PO DR TAB: 250.0000 mg | DELAYED_RELEASE_TABLET | Freq: Two times a day (BID) | ORAL | 0 refills | Status: DC

## 2017-05-28 MED ORDER — VITAMIN B-1 100 MG PO TABS
100.0000 mg | ORAL_TABLET | Freq: Every day | ORAL | Status: DC
  Administered 2017-05-28: 100 mg via ORAL
  Filled 2017-05-28: qty 1

## 2017-05-28 MED ORDER — CARVEDILOL 12.5 MG PO TABS: 12.5000 mg | ORAL_TABLET | Freq: Two times a day (BID) | ORAL | 0 refills | Status: DC

## 2017-05-28 MED ORDER — ASPIRIN 81 MG PO CHEW: 81.0000 mg | CHEWABLE_TABLET | Freq: Every day | ORAL | 0 refills | Status: DC

## 2017-05-28 MED ORDER — AMLODIPINE BESYLATE 2.5 MG PO TABS: 2.5000 mg | ORAL_TABLET | Freq: Every day | ORAL | 0 refills | Status: DC

## 2017-05-28 MED ORDER — AMIODARONE HCL 400 MG PO TABS: 400.0000 mg | ORAL_TABLET | Freq: Two times a day (BID) | ORAL | 0 refills | Status: DC

## 2017-05-28 MED ORDER — FOLIC ACID 1 MG PO TABS: 1.0000 mg | ORAL_TABLET | Freq: Every day | ORAL | 0 refills | Status: DC

## 2017-05-28 MED ORDER — NICOTINE 21 MG/24HR TD PT24: 21.0000 mg | MEDICATED_PATCH | Freq: Every day | TRANSDERMAL | 0 refills | Status: DC

## 2017-05-28 MED ORDER — FOLIC ACID 1 MG PO TABS
1.0000 mg | ORAL_TABLET | Freq: Every day | ORAL | Status: DC
  Administered 2017-05-28: 1 mg via ORAL
  Filled 2017-05-28: qty 1

## 2017-05-28 MED ORDER — CLONIDINE 0.1 MG/24HR TD PTWK: 0.1000 mg | MEDICATED_PATCH | TRANSDERMAL | 0 refills | Status: DC

## 2017-05-28 MED ORDER — LOSARTAN POTASSIUM 25 MG PO TABS: 25.0000 mg | ORAL_TABLET | Freq: Every day | ORAL | 0 refills | Status: DC

## 2017-05-28 MED ORDER — ATORVASTATIN CALCIUM 20 MG PO TABS: 20.0000 mg | ORAL_TABLET | Freq: Every day | ORAL | 0 refills | Status: DC

## 2017-05-28 NOTE — Discharge Summary (Addendum)
Discharge Summary  Tony Reynolds KDT:[id number] DOB: [birth year]  PCP: Celene Squibb, MD  Admit date: 05/19/2017 Discharge date: 05/28/2017  Time spent: >30 mins  Recommendations for Outpatient Follow-up:  1. PCP 2. Cardiology  Discharge Diagnoses:  Active Hospital Problems   Diagnosis Date Noted  . Agitation   . Dysphagia   . Acute systolic congestive heart failure ([place])   . ETOH abuse   . Coronary artery disease involving native coronary artery of native heart without angina pectoris   . Acute combined systolic and diastolic congestive heart failure ([place])   . Tobacco abuse   . Benign essential HTN   . Hypokalemia   . Cardiac arrest ([place]) 05/19/2017  . Acute myocardial infarction ([place])   . Endotracheally intubated     Resolved Hospital Problems  No resolved problems to display.    Discharge Condition: Stable   Diet recommendation: Heart healthy  Vitals:   05/28/17 0949 05/28/17 1628  BP: (!) 149/91 (!) 120/58  Pulse: 71 75  Resp:    Temp:    SpO2: 100%     History of present illness:  Tony Reynolds a46 y.o.malewith a history of HTN, tobacco use, and LVH who experienced cardiac arrest at home with at least 18 minutes of no CPR support. He was intubated and admitted to the ICU with urgent catheterization showing minimal occlusive disease. He experienced repeat VFib arrest on the cath table with ROSC after 1 shock. Arrhythmias continued with EP following. Plan was for ICD though bullous skin lesions on legs has caused delay, so pt will have lifevest applied. The patient is suspected to have had anoxic brain injury and lacks capacity for medical decision making, therefore IVC was filed 1/6, currently IVC has been revoked.  Today noted to be more calm. Pt was met resting, answering all questions appropriately. Looked comfortable, not in any distress. Life vest currently being fitted. Stable for discharge with close monitoring and follow up apt scheduled with  cardiology  Hospital Course:  Principal Problem:   Agitation Active Problems:   Cardiac arrest [facility])   Acute myocardial infarction ([place])   Endotracheally intubated   Dysphagia   Acute systolic congestive heart failure ([facility])   ETOH abuse   Coronary artery disease involving native coronary artery of native heart without angina pectoris   Acute combined systolic and diastolic congestive heart failure ([facility])   Tobacco abuse   Benign essential HTN   Hypokalemia  S/P out of hospital cardiac arrest No CPR at scene, ROSC per EMS after at least 18 minutes -Severe LV systolic dysfxn with evidence of VT -Plans for ICD, but due to skin lesions, will be postponed till they heal - EP consulted, on amiodarone - LifeVest placement upon discharge and follow up arranged per EP - Follow up with PCP as well  NICM, HTN, non-obstructive CAD  EF 20-25% by echo, 35-40% by cath.  - Antihypertensives per EP/cardiology - Continue Coreg, amlodipine, aspirin, clonidine, losartan, statin  QT prolongation - Would avoid provocative medications, including antipsychotics, at all cost.   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 Uncertain etiology. No mucous membrane involvement.  - Keep ruptured bullae covered, no evidence of infection.WOC consulted, recommended xeroform gauze. - Monitor. Consider further work up/dermatology referral if  worsens  Tobacco use  - Advised to quit PCP to follow up  Right leg swelling Mild, noted 1/8  - RLE U/S doppler negative for DVT   Procedures: Cardiac cath  Consultations:  Cardiology/EP  Discharge Exam: BP (!) 120/58   Pulse 75   Temp 98.7 F (37.1 C) (Oral)   Resp 17   Ht _0  (1.778 m)   Wt 67.2 kg (148 lb 1.6 oz)   SpO2  100%   BMI 21.25 kg/m   General: Alert, awake, NAD Cardiovascular: S1, S2 present, no added hrt sound Respiratory: Chest clear bilaterally   Discharge Instructions You were cared for by a hospitalist during your hospital stay. If you have any questions about your discharge medications or the care you received while you were in the hospital after you are discharged, you can call the unit and asked to speak with the hospitalist on call if the hospitalist that took care of you is not available. Once you are discharged, your primary care physician will handle any further medical issues. Please note that NO REFILLS for any discharge medications will be authorized once you are discharged, as it is imperative that you return to your primary care physician (or establish a relationship with a primary care physician if you do not have one) for your aftercare needs so that they can reassess your need for medications and monitor your lab values.  Discharge Instructions    Diet - low sodium heart healthy   Complete by:  As directed    Increase activity slowly   Complete by:  As directed      Allergies as of 05/28/2017      Reactions   Tomato Other (See Comments)   No acidic foods      Medication List    STOP taking these medications   aspirin 325 MG EC tablet Replaced by:  aspirin 81 MG chewable tablet   HYDROcodone-acetaminophen 5-325 MG tablet Commonly known as:  NORCO/VICODIN   lisinopril 40 MG tablet Commonly known as:  PRINIVIL,ZESTRIL   omeprazole 20 MG capsule Commonly known as:  PRILOSEC   oxyCODONE-acetaminophen 5-325 MG tablet Commonly known as:  ROXICET   sucralfate 1 g tablet Commonly known as:  CARAFATE     TAKE these medications   amiodarone 400 MG tablet Commonly known as:  PACERONE Take 1 tablet (400 mg total) by mouth 2 (two) times daily.   amLODipine 2.5 MG tablet Commonly known as:  NORVASC Take 1 tablet (2.5 mg total) by mouth daily. Start taking on:   05/29/2017 What changed:    medication strength  how much to take   aspirin 81 MG chewable tablet Chew 1 tablet (81 mg total) by mouth daily. Start taking on:  05/29/2017 Replaces:  aspirin 325 MG EC tablet   atorvastatin 20 MG tablet Commonly known as:  LIPITOR Take 1 tablet (20 mg total) by mouth daily at 6 PM. Start taking on:  05/29/2017   carvedilol 12.5 MG tablet Commonly known as:  COREG Take 1 tablet (12.5 mg total) by mouth 2 (two) times daily with a meal. Start taking on:  05/29/2017   cloNIDine 0.1 mg/24hr patch Commonly known as:  CATAPRES - Dosed in mg/24 hr Place 1 patch (0.1 mg total) onto the skin once a week. Start taking on:  06/03/2017   divalproex 250 MG DR tablet Commonly known as:  DEPAKOTE Take 1 tablet (250 mg total) by mouth every 12 (twelve) hours.   folic acid  1 MG tablet Commonly known as:  FOLVITE Take 1 tablet (1 mg total) by mouth daily. Start taking on:  05/29/2017   losartan 25 MG tablet Commonly known as:  COZAAR Take 1 tablet (25 mg total) by mouth daily. Start taking on:  05/29/2017   methocarbamol 500 MG tablet Commonly known as:  ROBAXIN Take 1 tablet (500 mg total) by mouth every 6 (six) hours as needed for muscle spasms.   nicotine 21 mg/24hr patch Commonly known as:  NICODERM CQ - dosed in mg/24 hours Place 1 patch (21 mg total) onto the skin daily.   thiamine 100 MG tablet Take 1 tablet (100 mg total) by mouth daily. Start taking on:  05/29/2017      Allergies  Allergen Reactions  . Tomato Other (See Comments)    No acidic foods   Follow-up Information    Deboraha Sprang, MD Follow up on 06/15/2017.   Specialty:  Cardiology Why:  8:30AM Contact information: [address] [address] [phone]        [facility] Follow up.   Specialty:  Home Health Services Why:  they will do your home health care at your home Contact information: [place]  [address] [phone]        Celene Squibb, MD. Schedule an appointment as soon as possible for a visit in 1 week(s).   Specialty:  Internal Medicine Contact information: [place] [address] [phone]            The results of significant diagnostics from this hospitalization (including imaging, microbiology, ancillary and laboratory) are listed below for reference.    Significant Diagnostic Studies: Dg Chest Port 1 View  Result Date: 05/21/2017 CLINICAL DATA:  Intubation.  Respiratory failure. EXAM: PORTABLE CHEST 1 VIEW COMPARISON:  05/20/2017 . FINDINGS: Endotracheal tube, left IJ line, NG tube in stable position. Cardiomegaly. Mild bilateral from interstitial prominence. CHF cannot be excluded. Pneumonitis cannot be excluded. Small right pleural effusion. No pneumothorax. IMPRESSION: 1. Lines and tubes in stable position. 2. Cardiomegaly with mild bilateral interstitial prominence and small right pleural effusion. Findings suggest mild CHF. Pneumonitis cannot be excluded . Electronically Signed   By: Marcello Moores  Register   On: 05/21/2017 07:36   Dg Chest Port 1 View  Result Date: 05/20/2017 CLINICAL DATA:  47 year old male with endotracheal tube in place. Subsequent encounter. EXAM: PORTABLE CHEST 1 VIEW COMPARISON:  05/19/2017. FINDINGS: Endotracheal tube tip 4.7 cm above the carina. Left central line tip proximal superior vena cava level. Tip less laterally directed than on prior exam. Subsegmental atelectasis mid left lung and bilateral lower lobe. Slight decrease in degree of central pulmonary vascular prominence. Heart size top-normal. No gross pneumothorax. IMPRESSION: Slight change in position of left central line with tip proximal superior vena cava level. Slight decrease in degree of central pulmonary vascular prominence. Subsegmental atelectasis left mid lung and bibasilar region unchanged. Electronically Signed   By: Genia Del M.D.   On: 05/20/2017 07:35    Dg Chest Port 1 View  Result Date: 05/19/2017 CLINICAL DATA:  Central line placement EXAM: PORTABLE CHEST 1 VIEW COMPARISON:  Earlier the same day FINDINGS: 1037 hours. New left IJ central line identified with the tip overlying the innominate vein confluence. The tip of the catheter is directed laterally and may be against the wall of the venous anatomy. A tracheal tube tip is 3.5 cm above the base  of the carina. The NG tube passes into the stomach although the distal tip position is not included on the film. Similar appearance left parahilar opacity. The cardio pericardial silhouette is enlarged. Low volume film without pneumothorax. The visualized bony structures of the thorax are intact. Telemetry leads overlie the chest. IMPRESSION: Left IJ central line tip is positioned in the region of the innominate vein confluence with the tip directed laterally, potentially generating pressure on the wall of the vascular anatomy at this location. If this is a clinical concern, catheter could be advanced 1-2 cm or retracted a similar distance. No evidence for pneumothorax. Otherwise stable exam. Electronically Signed   By: Misty Stanley M.D.   On: 05/19/2017 11:32   Dg Chest Port 1 View  Result Date: 05/19/2017 CLINICAL DATA:  Endotracheal tube and orogastric tube placement. EXAM: PORTABLE CHEST 1 VIEW COMPARISON:  Chest radiograph performed earlier today at 1:38 a.m. FINDINGS: The patient's endotracheal tube is seen ending 6-7 cm above the carina. This could be advanced 3 cm. An enteric tube is seen extending below the diaphragm. New left midlung airspace opacity may reflect atelectasis or pneumonia. No pleural effusion or pneumothorax is seen. The cardiomediastinal silhouette is borderline normal in size. No acute osseous abnormalities are seen. IMPRESSION: 1. Endotracheal tube seen ending 6-7 cm above the carina. This could be advanced 3 cm. 2. Enteric tube seen extending below the diaphragm. 3. New left midlung  airspace opacity may reflect atelectasis or pneumonia. Electronically Signed   By: Garald Balding M.D.   On: 05/19/2017 04:59   Dg Chest Portable 1 View  Result Date: 05/19/2017 CLINICAL DATA:  Acute onset of asystole, status post CPR. EXAM: PORTABLE CHEST 1 VIEW COMPARISON:  Chest radiograph performed 03/01/2017, and CTA of the chest performed 03/02/2017 FINDINGS: The patient's endotracheal tube is seen ending 3 cm above the carina. An enteric tube is noted extending below the diaphragm. Minimal right midlung opacity may reflect atelectasis. No pleural effusion or pneumothorax is seen. The cardiomediastinal silhouette is borderline normal in size. No acute osseous abnormalities are seen. External pacing pads are noted. IMPRESSION: 1. Endotracheal tube seen ending 3 cm above the carina. 2. Minimal right midlung opacity may reflect atelectasis. Electronically Signed   By: Garald Balding M.D.   On: 05/19/2017 02:06    Microbiology: Recent Results (from the past 240 hour(s))  MRSA PCR Screening     Status: None   Collection Time: 05/19/17  3:38 AM  Result Value Ref Range Status   MRSA by PCR NEGATIVE NEGATIVE Final    Comment:        The GeneXpert MRSA Assay (FDA approved for NASAL specimens only), is one component of a comprehensive MRSA colonization surveillance program. It is not intended to diagnose MRSA infection nor to guide or monitor treatment for MRSA infections.      Labs: Basic Metabolic Panel: Recent Labs  Lab 05/23/17 0323 05/24/17 0314 05/25/17 0258 05/26/17 0309 05/27/17 0648 05/28/17 0646  NA 139 137  --  136 138 138  K 3.2* 3.6  --  3.4* 3.6 3.7  CL 104 105  --  103 104 104  CO2 24 25  --  18* 26 26  GLUCOSE 86 113*  --  88 85 93  BUN 14 9  --  _0 CREATININE 1.03 0.94  --  0.96 1.00 1.12  CALCIUM 8.3* 8.9  --  8.7* 8.8* 8.5*  MG 2.4 2.2 2.2 2.1 2.2 2.5*  Liver Function Tests: No results for input(s): AST, ALT, ALKPHOS, BILITOT, PROT, ALBUMIN in the  last 168 hours. No results for input(s): LIPASE, AMYLASE in the last 168 hours. No results for input(s): AMMONIA in the last 168 hours. CBC: Recent Labs  Lab 05/22/17 0345 05/24/17 0314  WBC 7.9 8.2  HGB 11.8* 13.6  HCT 34.4* 39.3  MCV 86.9 85.6  PLT 132* 159   Cardiac Enzymes: No results for input(s): CKTOTAL, CKMB, CKMBINDEX, TROPONINI in the last 168 hours. BNP: BNP (last 3 results) No results for input(s): BNP in the last 8760 hours.  ProBNP (last 3 results) No results for input(s): PROBNP in the last 8760 hours.  CBG: Recent Labs  Lab 05/22/17 2011 05/22/17 2314 05/23/17 0418 05/23/17 0819 05/23/17 1136  GLUCAP 97 81 76 132* 132*       Signed:  Alma Friendly, MD [facility] 05/28/2017, 6:42 PM

## 2017-05-28 NOTE — Evaluation (Signed)
Speech Language Pathology Evaluation Patient Details Name: Tony Reynolds MRN: [id number] DOB: 10-18-1970 Today's Date: 05/28/2017 Time: 6415-8309 SLP Time Calculation (min) (ACUTE ONLY): 17 min  Problem List:  Patient Active Problem List   Diagnosis Date Noted  . Dysphagia   . Acute systolic congestive heart failure (HCC)   . ETOH abuse   . Coronary artery disease involving native coronary artery of native heart without angina pectoris   . Acute combined systolic and diastolic congestive heart failure (HCC)   . Tobacco abuse   . Benign essential HTN   . Agitation   . Hypokalemia   . Cardiac arrest (HCC) 05/19/2017  . Acute myocardial infarction (HCC)   . Endotracheally intubated   . 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 Avascular necrosis of left femoral head (HCC) 07/04/2015   Past Medical History:  Past Medical History:  Diagnosis Date  . Arthritis   . Back pain   . Eczema   . Eczema   . Hypertension   . Lumbar disc herniation    Past Surgical History:  Past Surgical History:  Procedure Laterality Date  . BILATERAL ANTERIOR TOTAL HIP ARTHROPLASTY Bilateral 12/14/2015   Procedure: BILATERAL ANTERIOR TOTAL HIP ARTHROPLASTY;  Surgeon: Kathryne Hitch, MD;  Location: [facility];  Service: Orthopedics;  Laterality: Bilateral;  . CORONARY/GRAFT ACUTE MI REVASCULARIZATION N/A 05/19/2017   Procedure: Coronary/Graft Acute MI Revascularization;  Surgeon: Tonny Bollman, MD;  Location: [facility] INVASIVE CV LAB;  Service: Cardiovascular;  Laterality: N/A;  . LEFT HEART CATH AND CORONARY ANGIOGRAPHY N/A 05/19/2017   Procedure: LEFT HEART CATH AND CORONARY ANGIOGRAPHY;  Surgeon: Tonny Bollman, MD;  Location: [facility] INVASIVE CV LAB;  Service: Cardiovascular;  Laterality: N/A;  . NO PAST SURGERIES     HPI:  Patient is a 47 year old male with past medical history of hypertension, current smoker, LVH presenting with  cardiac arrest from home. He had no bystander CPR per accounts from the chart and family initial rhythm V. fib duration of code 18 minutes patient intubated EKG showing ST-T wave changes cardiology consulted taken to Cath Lab.  Catheterization showed minimal occlusive disease nothing requiring intervention.  However, patient had another V. fib episode on cath table and received defibrillation x1.  Upon arrival to to heart patient continued to have bigeminy trigeminy required amiodarone bolus followed by amio Infusion followed by mag followed by calcium followed by lidocaine infusion.   Assessment / Plan / Recommendation Clinical Impression  Pt's wife reports cognitive status has impoved but not at baseline. Exhibits slower processing, scored in the mod-severe range on Cognistat re: memory subtest, demonstrated decreased sustained attention, decreased working memory Writer) and reduced awareness. Pt would benefit from home health ST for cognitive evaluation and intervention.        SLP Assessment  SLP Recommendation/Assessment: All further Speech Lanaguage Pathology  needs can be addressed in the next venue of care SLP Visit Diagnosis: Dysphagia, unspecified (R13.10)    Follow Up Recommendations  Home health SLP    Frequency and Duration           SLP Evaluation Cognition  Overall Cognitive Status: Impaired/Different from baseline Arousal/Alertness: Awake/alert Orientation Level: Oriented to person;Disoriented to time;Disoriented to situation;Oriented to place Attention: Sustained Sustained Attention: Impaired Sustained Attention Impairment: Verbal basic Memory: Impaired Memory Impairment: (mod-severe deficit range) Awareness: Impaired Awareness Impairment: Anticipatory impairment;Emergent impairment;Intellectual impairment Problem Solving: (verbal functional, suspect difficulty demonstration) Executive Function:  Self Monitoring Self Monitoring:  Impaired Safety/Judgment: Impaired       Comprehension  Auditory Comprehension Overall Auditory Comprehension: Appears within functional limits for tasks assessed Interfering Components: Attention;Working Theatre manager: Not tested Reading Comprehension Reading Status: (TBA)    Expression Expression Primary Mode of Expression: Verbal Verbal Expression Overall Verbal Expression: Appears within functional limits for tasks assessed Initiation: No impairment Level of Generative/Spontaneous Verbalization: Conversation Repetition: (NT) Naming: No impairment Pragmatics: No impairment Interfering Components: Attention Written Expression Dominant Hand: Right Written Expression: (wrote name and address functional, wife states decreased leg)   Oral / Motor  Oral Motor/Sensory Function Overall Oral Motor/Sensory Function: Within functional limits Motor Speech Overall Motor Speech: Appears within functional limits for tasks assessed Respiration: Within functional limits Phonation: Normal Resonance: Within functional limits Articulation: Within functional limitis Intelligibility: (decr intelligibility, mostly dialect) Motor Planning: Witnin functional limits   GO                    Royce Macadamia 05/28/2017, 4:12 PM  Breck Coons [employer]Ed [employer] [phone]

## 2017-05-28 NOTE — Clinical Social Work Note (Signed)
Per MD, patient will discharge once fitted with life vest. Per RNCM, they will be here between 4:30 and 5:00 to fit him. Form to rescind IVC has been filled out and signed by MD. Nurse secretary will fax to clerk of court once discharge order is in.  Charlynn Court, CSW [phone]

## 2017-05-28 NOTE — Progress Notes (Signed)
Wound dressings changed this morning. Wife states that she will take care of the wound on the penis. She is still asleep at this time.

## 2017-05-28 NOTE — Progress Notes (Signed)
Physical Therapy Treatment Patient Details Name: Tony Reynolds MRN: [id number] DOB: 05/14/1971 Today's Date: 05/28/2017    History of Present Illness Patient is a 47 year old African American male with past medical history significant for B THA, hypertension, current smoker, and LVH. Patient was admitted with cardiac arrest from home. Patient was initially intubated and admitted to ICU Team. Initial EKG showed ST-T wave changes, Cardiology was consulted and patient underwent cardiac catheterization.  Catheterization showed minimal occlusive disease, but nothing requiring intervention. Patient was reported to have had another V. fib episode on cath table and received defibrillation x 1.  Post Cath, patient continued to have bigeminy and trigeminy that required amiodarone bolus followed by amio Infusion followed by mag followed by calcium followed by lidocaine infusion.     PT Comments    Pt is up to walk with assistance and noted his CIR consultation has been done.  Will be appropriate for follow up in SNF if CIR does not work out, but CIR would be best due to the amount of therapy that will be offered there.  He is walking with some significant instability but more stable mood.  Expecting him to continue acute therapy to add strength and control of standing balance to his functional level.    Follow Up Recommendations  CIR     Equipment Recommendations  Other (comment)    Recommendations for Other Services Rehab consult     Precautions / Restrictions Precautions Precautions: Fall Precaution Comments: telemetry Restrictions Weight Bearing Restrictions: No    Mobility  Bed Mobility Overal bed mobility: Needs Assistance Bed Mobility: Supine to Sit;Sit to Supine     Supine to sit: Min guard Sit to supine: Min guard   General bed mobility comments: pt is moving with poor quality and requires vc's and tc's to scoot up with PT to head of bed  Transfers Overall transfer level:  Needs assistance Equipment used: Rolling walker (2 wheeled);1 person hand held assist Transfers: Sit to/from Stand Sit to Stand: Supervision            Ambulation/Gait Ambulation/Gait assistance: Min guard;Min assist;+2 physical assistance;+2 safety/equipment Ambulation Distance (Feet): 200 Feet Assistive device: Rolling walker (2 wheeled) Gait Pattern/deviations: Step-through pattern;Decreased stride length;Wide base of support;Drifts right/left Gait velocity: decreased Gait velocity interpretation: Below normal speed for age/gender General Gait Details: Pt is up to walk with help on RW to direct it from obstacles, to cue for direction and to remind him about goal of the walk.     Stairs            Wheelchair Mobility    Modified Rankin (Stroke Patients Only)       Balance Overall balance assessment: Needs assistance Sitting-balance support: Feet supported;Bilateral upper extremity supported Sitting balance-Leahy Scale: Good     Standing balance support: Bilateral upper extremity supported;During functional activity Standing balance-Leahy Scale: Poor                              Cognition Arousal/Alertness: Awake/alert Behavior During Therapy: WFL for tasks assessed/performed;Agitated Overall Cognitive Status: Impaired/Different from baseline Area of Impairment: Memory;Safety/judgement;Awareness                   Current Attention Level: Selective Memory: Decreased recall of precautions;Decreased short-term memory Following Commands: Follows one step commands with increased time Safety/Judgement: Decreased awareness of deficits;Decreased awareness of safety Awareness: Intellectual   General Comments: slow to respond to vc's to  avoid obstacles on the hall so PT had to physically intervene      Exercises General Exercises - Lower Extremity Long Arc Quad: Strengthening;Both;10 reps Heel Slides: Strengthening;Both;10 reps Hip  ABduction/ADduction: Strengthening;Both;10 reps    General Comments General comments (skin integrity, edema, etc.): VSS      Pertinent Vitals/Pain Pain Assessment: Faces Faces Pain Scale: Hurts little more Pain Location: RLE Pain Descriptors / Indicators: Operative site guarding Pain Intervention(s): Monitored during session;Repositioned    Home Living                      Prior Function            PT Goals (current goals can now be found in the care plan section) Acute Rehab PT Goals Patient Stated Goal: go outside, home Progress towards PT goals: Progressing toward goals    Frequency    Min 3X/week      PT Plan Current plan remains appropriate    Co-evaluation              AM-PAC PT "6 Clicks" Daily Activity  Outcome Measure  Difficulty turning over in bed (including adjusting bedclothes, sheets and blankets)?: A Little Difficulty moving from lying on back to sitting on the side of the bed? : Unable Difficulty sitting down on and standing up from a chair with arms (e.g., wheelchair, bedside commode, etc,.)?: Unable Help needed moving to and from a bed to chair (including a wheelchair)?: A Little Help needed walking in hospital room?: A Little Help needed climbing 3-5 steps with a railing? : A Little 6 Click Score: 14    End of Session Equipment Utilized During Treatment: Gait belt Activity Tolerance: Patient tolerated treatment well Patient left: in bed;with call bell/phone within reach;with restraints reapplied;with family/visitor present Nurse Communication: Mobility status PT Visit Diagnosis: Unsteadiness on feet (R26.81);Difficulty in walking, not elsewhere classified (R26.2)     Time: 1130-1155 PT Time Calculation (min) (ACUTE ONLY): 25 min  Charges:  $Gait Training: 8-22 mins $Therapeutic Exercise: 8-22 mins                    G Codes:  Functional Assessment Tool Used: AM-PAC 6 Clicks Basic Mobility     Ivar Drape 05/28/2017,  1:16 PM   Samul Dada, PT MS Acute Rehab Dept. Number: [facility] [id number] and [facility] [phone]

## 2017-05-28 NOTE — Care Management Note (Addendum)
Case Management Note  Patient Details  Name: JAYDYNN IMMERMAN MRN: [id number] Date of Birth: June 11, 1970  Subjective/Objective:    VF cardiac arrest              Action/Plan: Patient was independent of all ADL's prior to admission, working fulltime; PCP is Dr Dwana Melena; has private insurance with BCBS with prescription drug coverage; pharmacy of choice is [facility]; Patient was not accepted into Inpt Rehab; CM talked to spouse for [facility] choices, she chose [facility]; CM talked to [place] with [facility] concerning their comprehensive Cognitive program for anoxic brain injuries. Corrie Dandy talked to spouse and explained their program in detail; all questions answered.He is to be fitted for a Technical sales engineer prior to discharging home.CM will continue to follow for progression of care.  Expected Discharge Date:    possibly 05/28/2017              Expected Discharge Plan:  Home w Home Health Services  Discharge planning Services  CM Consult  Choice offered to:  Spouse  DME Arranged:  Life vest DME Agency:  [facility]  HH Arranged:  RN, PT, OT, Speech Therapy HH Agency:  [facility] (now [facility])  Status of Service:  In process, will continue to follow  Reola Mosher [phone] 05/28/2017, 1:20 PM

## 2017-05-28 NOTE — Progress Notes (Signed)
  Speech Language Pathology Treatment: Dysphagia  Patient Details Name: Tony Reynolds MRN: [id number] DOB: [birth year] Today's Date: 05/28/2017 Time: 4739-5844 SLP Time Calculation (min) (ACUTE ONLY): 17 min  Assessment / Plan / Recommendation Clinical Impression  Pt demonstrated timely and efficient mastication with regular texture without residual. No s/s aspiration with thin or solid. Wife reports she has been bringing pt in regular textures and he has not exhibited any deficits. Recommend regular/thin. No further follow up needed for swallow.    HPI HPI: Patient is a 47 year old male with past medical history of hypertension, current smoker, LVH presenting with cardiac arrest from home. He had no bystander CPR per accounts from the chart and family initial rhythm V. fib duration of code 18 minutes patient intubated EKG showing ST-T wave changes cardiology consulted taken to Cath Lab.  Catheterization showed minimal occlusive disease nothing requiring intervention.  However, patient had another V. fib episode on cath table and received defibrillation x1.  Upon arrival to to heart patient continued to have bigeminy trigeminy required amiodarone bolus followed by amio Infusion followed by mag followed by calcium followed by lidocaine infusion.      SLP Plan  All goals met;Discharge SLP treatment due to (comment)  All further Speech Lanaguage Pathology  needs can be addressed in the next venue of care    Recommendations  Diet recommendations: Regular;Thin liquid Liquids provided via: Cup;Straw Medication Administration: Whole meds with puree Supervision: Patient able to self feed;Intermittent supervision to cue for compensatory strategies Compensations: Minimize environmental distractions;Slow rate;Small sips/bites Postural Changes and/or Swallow Maneuvers: Seated upright 90 degrees                Oral Care Recommendations: Oral care BID Follow up Recommendations: Home health  SLP SLP Visit Diagnosis: Dysphagia, unspecified (R13.10) Plan: All goals met;Discharge SLP treatment due to (comment)       GO                Houston Siren 05/28/2017, 4:17 PM  Orbie Pyo [employer].Ed [employer] [phone]

## 2017-05-28 NOTE — Progress Notes (Signed)
Looked in on this patient today who is sleeping well with sitter at bedside.  Spouse of the patient is not here and not available at the time.  Will continue to follow as needed.    05/28/17 1043  Clinical Encounter Type  Visited With Patient;Health care provider (sitter at bedside)  Visit Type Follow-up;Psychological support;Spiritual support

## 2017-06-02 ENCOUNTER — Other Ambulatory Visit: Payer: Self-pay

## 2017-06-02 ENCOUNTER — Emergency Department (HOSPITAL_COMMUNITY)
Admission: EM | Admit: 2017-06-02 | Discharge: 2017-06-02 | Disposition: A | Discharge status: Home / Self Care | Payer: BLUE CROSS/BLUE SHIELD | Attending: Emergency Medicine | Admitting: Emergency Medicine

## 2017-06-02 ENCOUNTER — Emergency Department (HOSPITAL_COMMUNITY): Payer: BLUE CROSS/BLUE SHIELD

## 2017-06-02 ENCOUNTER — Encounter (HOSPITAL_COMMUNITY): Payer: Self-pay | Admitting: Emergency Medicine

## 2017-06-02 DIAGNOSIS — M25569 Pain in unspecified knee: Secondary | ICD-10-CM | POA: Diagnosis present

## 2017-06-02 DIAGNOSIS — Z5321 Procedure and treatment not carried out due to patient leaving prior to being seen by health care provider: Secondary | ICD-10-CM | POA: Diagnosis not present

## 2017-06-02 HISTORY — DX: Acute myocardial infarction, unspecified: I21.9

## 2017-06-02 NOTE — ED Triage Notes (Signed)
Patient c/o right knee pain. Denies any known injury. Per patient was seen here in ED 12/1 and sent to [facility] for MI and started having pain in knee shortly after. Patient states that he seen PCP and was told swelling was only around the knee-was sent here for x-ray.

## 2017-06-02 NOTE — ED Notes (Signed)
Pt called twice for room. No answer.

## 2017-06-03 ENCOUNTER — Ambulatory Visit (INDEPENDENT_AMBULATORY_CARE_PROVIDER_SITE_OTHER): Payer: BLUE CROSS/BLUE SHIELD | Admitting: Orthopedic Surgery

## 2017-06-03 ENCOUNTER — Encounter (INDEPENDENT_AMBULATORY_CARE_PROVIDER_SITE_OTHER): Payer: Self-pay | Admitting: Orthopedic Surgery

## 2017-06-03 DIAGNOSIS — M25461 Effusion, right knee: Secondary | ICD-10-CM | POA: Diagnosis not present

## 2017-06-03 LAB — GRAM STAIN
MICRO NUMBER:: 90066411
SPECIMEN QUALITY: ADEQUATE

## 2017-06-04 ENCOUNTER — Encounter (INDEPENDENT_AMBULATORY_CARE_PROVIDER_SITE_OTHER): Payer: Self-pay | Admitting: Orthopedic Surgery

## 2017-06-04 DIAGNOSIS — M25461 Effusion, right knee: Secondary | ICD-10-CM | POA: Diagnosis not present

## 2017-06-04 LAB — SYNOVIAL CELL COUNT + DIFF, W/ CRYSTALS
Basophils, %: 0 %
EOSINOPHILS-SYNOVIAL: 0 % (ref 0–2)
LYMPHOCYTES-SYNOVIAL FLD: 52 % (ref 0–74)
Monocyte/Macrophage: 31 % (ref 0–69)
NEUTROPHIL, SYNOVIAL: 17 % (ref 0–24)
SYNOVIOCYTES, %: 0 % (ref 0–15)
WBC, Synovial: 622 cells/uL — ABNORMAL HIGH (ref <150)

## 2017-06-04 MED ORDER — BUPIVACAINE HCL 0.25 % IJ SOLN
4.0000 mL | INTRAMUSCULAR | Status: AC | PRN
  Administered 2017-06-04: 4 mL via INTRA_ARTICULAR

## 2017-06-04 MED ORDER — METHYLPREDNISOLONE ACETATE 40 MG/ML IJ SUSP
40.0000 mg | INTRAMUSCULAR | Status: AC | PRN
  Administered 2017-06-04: 40 mg via INTRA_ARTICULAR

## 2017-06-04 MED ORDER — LIDOCAINE HCL 1 % IJ SOLN
5.0000 mL | INTRAMUSCULAR | Status: AC | PRN
  Administered 2017-06-04: 5 mL

## 2017-06-04 NOTE — Progress Notes (Signed)
Office Visit Note   Patient: Tony Reynolds           Date of Birth: 1970-09-13           MRN: [id number] Visit Date: 06/03/2017 Requested by: Tony Stabile, MD [address] [place], [place] [address] PCP: Tony Stabile, MD  Subjective: Chief Complaint  Patient presents with  . Right Knee - Pain    HPI: Tony Reynolds is a 47 year old patient with right knee pain.  Denies any history of injury.  2 weeks ago he had a significant heart attack and had a fall at that time.  States he has swelling and it is painful for him to walk on it.  He denies any fevers or chills.  He feels like there is something moving in his leg.  He has no history of gout or pseudogout              ROS: All systems reviewed are negative as they relate to the chief complaint within the history of present illness.  Patient denies  fevers or chills.   Assessment & Plan: Visit Diagnoses:  1. Effusion, right knee     Plan: Impression is right knee pain with effusion.  Collateral and cruciate ligaments are stable.  Aspiration performed today and injection also performed today.  No organisms on Gram stain at the time of this dictation and the white count is only 600 in the fluid.  This does not look like acute infection as the fluid was relatively clear yellow.  Injection was performed into the knee and we will see how that helps some.  Follow-up in 3 weeks with decision point at that time for or against MRI scanning.  If he does have some type of operative pathology in the knee it is unlikely he will be able to undergo any anesthetic for that procedure due to his recent heart attack but we could gain more information about the source of the swelling in his knee.  Follow-Up Instructions: Return in about 3 weeks (around 06/24/2017).   Orders:  Orders Placed This Encounter  Procedures  . Anaerobic and Aerobic Culture  . Gram stain  . Cell count + diff,  w/ cryst-synvl fld   No orders of the defined types were placed in  this encounter.     Procedures: Large Joint Inj: R knee on 06/04/2017 1:44 PM Indications: diagnostic evaluation, joint swelling and pain Details: 18 G 1.5 in needle, superolateral approach  Arthrogram: No  Medications: 5 mL lidocaine 1 %; 40 mg methylPREDNISolone acetate 40 MG/ML; 4 mL bupivacaine 0.25 % Aspirate: 30 mL yellow Outcome: tolerated well, no immediate complications Procedure, treatment alternatives, risks and benefits explained, specific risks discussed. Consent was given by the patient. Immediately prior to procedure a time out was called to verify the correct patient, procedure, equipment, support staff and site/side marked as required. Patient was prepped and draped in the usual sterile fashion.       Clinical Data: No additional findings.  Objective: Vital Signs: There were no vitals taken for this visit.  Physical Exam:   Constitutional: Patient appears well-developed HEENT:  Head: Normocephalic Eyes:EOM are normal Neck: Normal range of motion Cardiovascular: Normal rate Pulmonary/chest: Effort normal Neurologic: Patient is alert Skin: Skin is warm Psychiatric: Patient has normal mood and affect    Ortho Exam: Orthopedic exam demonstrates antalgic gait to the right but with palpable pedal pulses and mild pitting edema bilateral lower extremities.  He  does have desquamation of around both knees consistent with his history of having used levo fed while he was in the hospital.  There is no proximal lymphadenopathy on the right.  Collateral and cruciate ligaments are stable.  He is very touchy about anything coming near the right knee.  The knee itself is not warm to touch compared to the left  Specialty Comments:  No specialty comments available.  Imaging: No results found.   PMFS History: Patient Active Problem List   Diagnosis Date Noted  . Dysphagia   . Acute systolic congestive heart failure (HCC)   . ETOH abuse   . Coronary artery disease  involving native coronary artery of native heart without angina pectoris   . Acute combined systolic and diastolic congestive heart failure (HCC)   . Tobacco abuse   . Benign essential HTN   . Agitation   . Hypokalemia   . Cardiac arrest (HCC) 05/19/2017  . Acute myocardial infarction (HCC)   . Endotracheally intubated   . 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 Arthritis   . Back pain   . Eczema   . Eczema   . Hypertension   . Lumbar disc herniation   . MI (myocardial infarction) (HCC) 2018    History reviewed. No pertinent family history.  Past Surgical History:  Procedure Laterality Date  . BILATERAL ANTERIOR TOTAL HIP ARTHROPLASTY Bilateral 12/14/2015   Procedure: BILATERAL ANTERIOR TOTAL HIP ARTHROPLASTY;  Surgeon: Tony Hitch, MD;  Location: [facility];  Service: Orthopedics;  Laterality: Bilateral;  . CORONARY/GRAFT ACUTE MI REVASCULARIZATION N/A 05/19/2017   Procedure: Coronary/Graft Acute MI Revascularization;  Surgeon: Tony Bollman, MD;  Location: [facility] INVASIVE CV LAB;  Service: Cardiovascular;  Laterality: N/A;  . LEFT HEART CATH AND CORONARY ANGIOGRAPHY N/A 05/19/2017   Procedure: LEFT HEART CATH AND CORONARY ANGIOGRAPHY;  Surgeon: Tony Bollman, MD;  Location: [facility] INVASIVE CV LAB;  Service: Cardiovascular;  Laterality: N/A;  . NO PAST SURGERIES     Social History   Occupational History  . Not on file  Tobacco Use  . Smoking status: Current Every Day Smoker    Packs/day: 0.50    Types: Cigarettes    Start date: 04/01/1989  . Smokeless tobacco: Never Used  . Tobacco comment: 10 ciggs per day  Substance and Sexual Activity  . Alcohol use: Yes    Alcohol/week: 0.0 oz    Comment: 14  total  of 12 oz beer weekly  . Drug use: No  . Sexual activity: Not on file

## 2017-06-05 NOTE — Progress Notes (Signed)
Please call patient with results. Thanks no infection thanks

## 2017-06-09 LAB — ANAEROBIC AND AEROBIC CULTURE
AER RESULT:: NO GROWTH
MICRO NUMBER:: 90066376
MICRO NUMBER:: 90066377
SPECIMEN QUALITY: ADEQUATE
SPECIMEN QUALITY: ADEQUATE

## 2017-06-15 ENCOUNTER — Ambulatory Visit: Payer: BLUE CROSS/BLUE SHIELD | Admitting: Internal Medicine

## 2017-06-15 ENCOUNTER — Encounter: Payer: Self-pay | Admitting: Internal Medicine

## 2017-06-15 VITALS — BP 114/80 | HR 47 | Resp 15 | Ht 66.0 in | Wt 166.4 lb

## 2017-06-15 DIAGNOSIS — I4901 Ventricular fibrillation: Secondary | ICD-10-CM | POA: Diagnosis not present

## 2017-06-15 DIAGNOSIS — I469 Cardiac arrest, cause unspecified: Secondary | ICD-10-CM | POA: Diagnosis not present

## 2017-06-15 LAB — BASIC METABOLIC PANEL
BUN/Creatinine Ratio: 17 (ref 9–20)
BUN: 17 mg/dL (ref 6–24)
CALCIUM: 8.9 mg/dL (ref 8.7–10.2)
CO2: 23 mmol/L (ref 20–29)
Chloride: 101 mmol/L (ref 96–106)
Creatinine, Ser: 0.99 mg/dL (ref 0.76–1.27)
GFR, EST AFRICAN AMERICAN: 107 mL/min/{1.73_m2} (ref >59)
GFR, EST NON AFRICAN AMERICAN: 92 mL/min/{1.73_m2} (ref >59)
Glucose: 86 mg/dL (ref 65–99)
POTASSIUM: 4.2 mmol/L (ref 3.5–5.2)
SODIUM: 142 mmol/L (ref 134–144)

## 2017-06-15 MED ORDER — CARVEDILOL 12.5 MG PO TABS: 6.2500 mg | ORAL_TABLET | Freq: Two times a day (BID) | ORAL | 3 refills | Status: DC

## 2017-06-15 MED ORDER — LOSARTAN POTASSIUM 25 MG PO TABS: 25.0000 mg | ORAL_TABLET | Freq: Every day | ORAL | 3 refills | Status: DC

## 2017-06-15 MED ORDER — SPIRONOLACTONE 25 MG PO TABS: 12.5000 mg | ORAL_TABLET | Freq: Every day | ORAL | 3 refills | Status: DC

## 2017-06-15 MED ORDER — ATORVASTATIN CALCIUM 20 MG PO TABS: 20.0000 mg | ORAL_TABLET | Freq: Every day | ORAL | 3 refills | Status: AC

## 2017-06-15 MED ORDER — CLONIDINE 0.1 MG/24HR TD PTWK: 0.1000 mg | MEDICATED_PATCH | TRANSDERMAL | 3 refills | Status: DC

## 2017-06-15 MED ORDER — AMIODARONE HCL 400 MG PO TABS: 400.0000 mg | ORAL_TABLET | Freq: Two times a day (BID) | ORAL | 3 refills | Status: DC

## 2017-06-15 NOTE — Patient Instructions (Addendum)
Medication Instructions:  Your physician has recommended you make the following change in your medication:    1. Stop Amlodipine starting today  2. Decrease your Coreg (Carvedilol) to a half tablet (6.25mg ) 2 times perday  3. Begin Aldactone (Spironolactone) half tablet (12.5mg ) per day   Labwork:  You will have a BMP drawn today.   On Jul 07, 2017 please come by the office to have labs drawn for your procedure.  You may come in any time during the day before 5pm.   Testing/Procedures:  Your physician has requested that you have a cardiac MRI. Cardiac MRI uses a computer to create images of your heart as its beating, producing both still and moving pictures of your heart and major blood vessels. For further information please visit [email or web address]. Please follow the instruction sheet given to you today for more information.   Your physician has recommended that you have a defibrillator inserted. An implantable cardioverter defibrillator (ICD) is a small device that is placed in your chest or, in rare cases, your abdomen. This device uses electrical pulses or shocks to help control life-threatening, irregular heartbeats that could lead the heart to suddenly stop beating (sudden cardiac arrest). Leads are attached to the ICD that goes into your heart. This is done in the hospital and usually requires an overnight stay. Please see the instruction sheet given to you today for more information.   Follow-Up: You will follow up in our device clinic 10-14 days after your procedure for a wound check.  You will follow up with Dr Graciela Husbands 91 days after your procedure.   Any Other Special Instructions Will Be Listed Below (If Applicable).  Please arrive at the [facility] main entrance of [facility] at:  July 17, 2017 5:30am Do not eat or drink after midnight prior to procedure Hold only your Aldactone the morning of your procedure. Take all other medications as directed with a sip of  water.  Plan for one night stay You will need someone to drive you home at discharge  Please use the CHG scrub the night before and morning of your procedure as interacted.    If you need a refill on your cardiac medications before your next appointment, please call your pharmacy.

## 2017-06-15 NOTE — Progress Notes (Signed)
Patient Care Team: Benita Stabile, MD as PCP - General (Internal Medicine)   HPI  Tony Reynolds is a 47 y.o. male Seen in follow-up for cardiac arrest 1/19.  Catheterization had demonstrated nonobstructive disease.  There was recurrent ventricular fibrillation that was PVC triggered but not pause dependent.  It was not consistent with torsade.  Device implantation was deferred because of numerous skin lesions of unclear cause as well as mental status instability   He has been seen by his primary care physician and orthopedics and was started on antibiotics with marked improvement in his skin lesions.  Echo EF 25% cath 35-40%  He was treated with amiodarone.  Guideline directed therapy initiated for cardiomyopathy and he was discharged with a LifeVest  He has a history of alcohol abuse he continues to drink upwards of 48 ounces a day  He would like to return to work.  He has some shortness of breath.  Dyspnea or peripheral edema.  Denies chest pain.  Records and Results Reviewed   Past Medical History:  Diagnosis Date  . Arthritis   . Back pain   . Eczema   . Eczema   . Hypertension   . Lumbar disc herniation   . MI (myocardial infarction) (HCC) 2018    Past Surgical History:  Procedure Laterality Date  . BILATERAL ANTERIOR TOTAL HIP ARTHROPLASTY Bilateral 12/14/2015   Procedure: BILATERAL ANTERIOR TOTAL HIP ARTHROPLASTY;  Surgeon: Kathryne Hitch, MD;  Location: [facility];  Service: Orthopedics;  Laterality: Bilateral;  . CORONARY/GRAFT ACUTE MI REVASCULARIZATION N/A 05/19/2017   Procedure: Coronary/Graft Acute MI Revascularization;  Surgeon: Tonny Bollman, MD;  Location: [facility] INVASIVE CV LAB;  Service: Cardiovascular;  Laterality: N/A;  . LEFT HEART CATH AND CORONARY ANGIOGRAPHY N/A 05/19/2017   Procedure: LEFT HEART CATH AND CORONARY ANGIOGRAPHY;  Surgeon: Tonny Bollman, MD;  Location: [facility] INVASIVE CV LAB;  Service: Cardiovascular;  Laterality: N/A;  . NO PAST  SURGERIES      Current Outpatient Medications  Medication Sig Dispense Refill  . amiodarone (PACERONE) 400 MG tablet Take 1 tablet (400 mg total) by mouth 2 (two) times daily. 60 tablet 0  . amLODipine (NORVASC) 2.5 MG tablet Take 1 tablet (2.5 mg total) by mouth daily. 30 tablet 0  . aspirin 81 MG chewable tablet Chew 1 tablet (81 mg total) by mouth daily. 30 tablet 0  . atorvastatin (LIPITOR) 20 MG tablet Take 1 tablet (20 mg total) by mouth daily at 6 PM. 30 tablet 0  . carvedilol (COREG) 12.5 MG tablet Take 1 tablet (12.5 mg total) by mouth 2 (two) times daily with a meal. 60 tablet 0  . cloNIDine (CATAPRES - DOSED IN MG/24 HR) 0.1 mg/24hr patch Place 1 patch (0.1 mg total) onto the skin once a week. 3 patch 0  . divalproex (DEPAKOTE) 250 MG DR tablet Take 1 tablet (250 mg total) by mouth every 12 (twelve) hours. 60 tablet 0  . folic acid (FOLVITE) 1 MG tablet Take 1 tablet (1 mg total) by mouth daily. 30 tablet 0  . losartan (COZAAR) 25 MG tablet Take 1 tablet (25 mg total) by mouth daily. 30 tablet 0  . methocarbamol (ROBAXIN) 500 MG tablet Take 1 tablet (500 mg total) by mouth every 6 (six) hours as needed for muscle spasms. 60 tablet 0  . thiamine 100 MG tablet Take 1 tablet (100 mg total) by mouth daily. 30 tablet 0  . nicotine (NICODERM CQ - DOSED  IN MG/24 HOURS) 21 mg/24hr patch Place 1 patch (21 mg total) onto the skin daily. (Patient not taking: Reported on 06/15/2017) 28 patch 0   No current facility-administered medications for this visit.     Allergies  Allergen Reactions  . Tomato Other (See Comments)    No acidic foods - eczema      Review of Systems negative except from HPI and PMH  Physical Exam BP 114/80   Pulse (!) 47   Resp 15   Ht 5\' 6"  (1.676 m)   Wt 166 lb 6.4 oz (75.5 kg)   SpO2 99%   BMI 26.86 kg/m  Well developed and well nourished in no acute distress HENT normal E scleral and icterus clear Neck Supple JVP flat; carotids brisk and full Clear  to ausculation Regular rate and rhythm, no murmurs gallops or rub Soft with active bowel sounds No clubbing cyanosis  Edema Alert and oriented, grossly normal motor and sensory function Skin Warm and Dry impetiginous like lesions are healing  ECG demonstrates sinus rhythm at 47 Intervals 18/11/48 there are bifid T waves in leads II 3 AV 3-V6 5  Assessment and  Plan  NICM  Cardiac Arrest  Skin lesions  Sinus bradycardia  Alcohol Abuse  Hypertension  Abnormal ECG  LifeVest in place   The patient has an aborted cardiac arrest in the context of PVC triggered ventricular fibrillation.  Ejection fraction was depressed mechanism of which is not clear but may be related to alcohol.  I have stressed the importance of abstinence he is on guideline directed therapy; we will discontinue his amlodipine  And begin him on Aldactone.  We will check his metabolic profile today and he will need another metabolic profile in 2 weeks to assess for hyperkalemia following the initiation of Aldactone  Given his sinus bradycardia and accumulation of amiodarone we will decrease his carvedilol from 12.5-6.25.  We have discussed ICD implantation.  Given his history of cutaneous lesions, I would recommend a subcutaneous ICD we will have him mapped.  We anticipate also cardiac MRI to try to understand the mechanism of his cardiac arrest.  His ECG is abnormal with biphasic T waves.  This suggests an underlying repolarization abnormality.  There is also J-point elevation.    More than 50% of 40 over was residue min was spent in counseling related to the above  He also needs follow-up with his PCP to reevaluate his skin lesions   Current medicines are reviewed at length with the patient today .  The patient does not also 2-week have concerns regarding medicines.

## 2017-06-16 ENCOUNTER — Encounter: Payer: Self-pay | Admitting: Internal Medicine

## 2017-06-24 ENCOUNTER — Ambulatory Visit (INDEPENDENT_AMBULATORY_CARE_PROVIDER_SITE_OTHER): Payer: BLUE CROSS/BLUE SHIELD | Admitting: Orthopedic Surgery

## 2017-06-24 ENCOUNTER — Encounter (INDEPENDENT_AMBULATORY_CARE_PROVIDER_SITE_OTHER): Payer: Self-pay | Admitting: Orthopedic Surgery

## 2017-06-24 ENCOUNTER — Other Ambulatory Visit (HOSPITAL_COMMUNITY): Payer: Self-pay | Admitting: Internal Medicine

## 2017-06-24 DIAGNOSIS — M25461 Effusion, right knee: Secondary | ICD-10-CM | POA: Diagnosis not present

## 2017-06-27 ENCOUNTER — Encounter (INDEPENDENT_AMBULATORY_CARE_PROVIDER_SITE_OTHER): Payer: Self-pay | Admitting: Orthopedic Surgery

## 2017-06-27 NOTE — Progress Notes (Signed)
Office Visit Note   Patient: Tony Reynolds           Date of Birth: 1971/05/09           MRN: [id number] Visit Date: 06/24/2017 Requested by: Benita Stabile, MD [address] [place], [place] [address] PCP: Benita Stabile, MD  Subjective: Chief Complaint  Patient presents with  . Right Knee - Follow-up    HPI: Tony Reynolds is a patient with right knee pain.  Patient had aspiration and injection of the knee 06/03/2017.  He is doing better.  No problems.  No mechanical symptoms.  States that the rash on his knee is clearing up.  There were no crystals in the knee fluid.  Is not having any mechanical symptoms.              ROS: All systems reviewed are negative as they relate to the chief complaint within the history of present illness.  Patient denies  fevers or chills.   Assessment & Plan: Visit Diagnoses:  1. Effusion, right knee     Plan: Impression is right knee pain without definite mechanical symptoms.  Patient is doing better.  I would favor another aspiration and injection should his symptoms recur.  For now non-loadbearing exercise is encouraged.  Over-the-counter medication for symptoms as needed  Follow-Up Instructions: Return if symptoms worsen or fail to improve.   Orders:  No orders of the defined types were placed in this encounter.  No orders of the defined types were placed in this encounter.     Procedures: No procedures performed   Clinical Data: No additional findings.  Objective: Vital Signs: There were no vitals taken for this visit.  Physical Exam:   Constitutional: Patient appears well-developed HEENT:  Head: Normocephalic Eyes:EOM are normal Neck: Normal range of motion Cardiovascular: Normal rate Pulmonary/chest: Effort normal Neurologic: Patient is alert Skin: Skin is warm Psychiatric: Patient has normal mood and affect    Ortho Exam: Pubic exam demonstrates good range of motion of the right knee with no recurrent effusion.   Collateral cruciate ligaments are stable.  Extensor mechanism is intact.  Pedal pulses palpable.  No groin pain with internal/external rotation of the leg.  No other masses lymphadenopathy or skin changes noted in the right knee region.  Specialty Comments:  No specialty comments available.  Imaging: No results found.   PMFS History: Patient Active Problem List   Diagnosis Date Noted  . Dysphagia   . Acute systolic congestive heart failure (HCC)   . ETOH abuse   . Coronary artery disease involving native coronary artery of native heart without angina pectoris   . Acute combined systolic and diastolic congestive heart failure (HCC)   . Tobacco abuse   . Benign essential HTN   . Agitation   . Hypokalemia   . Cardiac arrest (HCC) 05/19/2017  . Acute myocardial infarction (HCC)   . Endotracheally intubated   . 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 Arthritis   . Back pain   . Eczema   . Eczema   . Hypertension   . Lumbar disc herniation   . MI (myocardial infarction) (HCC) 2018    History reviewed. No pertinent family history.  Past Surgical History:  Procedure Laterality Date  . BILATERAL ANTERIOR TOTAL HIP ARTHROPLASTY  Bilateral 12/14/2015   Procedure: BILATERAL ANTERIOR TOTAL HIP ARTHROPLASTY;  Surgeon: Kathryne Hitch, MD;  Location: [facility];  Service: Orthopedics;  Laterality: Bilateral;  . CORONARY/GRAFT ACUTE MI REVASCULARIZATION N/A 05/19/2017   Procedure: Coronary/Graft Acute MI Revascularization;  Surgeon: Tonny Bollman, MD;  Location: [facility] INVASIVE CV LAB;  Service: Cardiovascular;  Laterality: N/A;  . LEFT HEART CATH AND CORONARY ANGIOGRAPHY N/A 05/19/2017   Procedure: LEFT HEART CATH AND CORONARY ANGIOGRAPHY;  Surgeon: Tonny Bollman, MD;  Location: [facility] INVASIVE CV LAB;  Service:  Cardiovascular;  Laterality: N/A;  . NO PAST SURGERIES     Social History   Occupational History  . Not on file  Tobacco Use  . Smoking status: Current Every Day Smoker    Packs/day: 0.50    Types: Cigarettes    Start date: 04/01/1989  . Smokeless tobacco: Never Used  . Tobacco comment: 10 ciggs per day  Substance and Sexual Activity  . Alcohol use: Yes    Alcohol/week: 0.0 oz    Comment: 14  total  of 12 oz beer weekly  . Drug use: No  . Sexual activity: Not on file

## 2017-06-29 ENCOUNTER — Ambulatory Visit (HOSPITAL_COMMUNITY): Payer: BLUE CROSS/BLUE SHIELD | Attending: Internal Medicine

## 2017-07-02 ENCOUNTER — Other Ambulatory Visit (HOSPITAL_COMMUNITY): Payer: Self-pay | Admitting: Internal Medicine

## 2017-07-02 DIAGNOSIS — N50819 Testicular pain, unspecified: Secondary | ICD-10-CM

## 2017-07-03 ENCOUNTER — Telehealth: Payer: Self-pay

## 2017-07-03 NOTE — Telephone Encounter (Signed)
Spoke with patient about cardiac MRI. He was informed it was rescheduled for 2/25 and he will have to keep his MRI appointment in order to move forward with his sqICD on 3/1. He verbalized understanding and had no further questions.

## 2017-07-07 ENCOUNTER — Encounter (INDEPENDENT_AMBULATORY_CARE_PROVIDER_SITE_OTHER): Payer: Self-pay

## 2017-07-07 ENCOUNTER — Other Ambulatory Visit: Payer: BLUE CROSS/BLUE SHIELD

## 2017-07-07 DIAGNOSIS — I4901 Ventricular fibrillation: Secondary | ICD-10-CM

## 2017-07-07 DIAGNOSIS — I469 Cardiac arrest, cause unspecified: Secondary | ICD-10-CM

## 2017-07-07 LAB — CBC WITH DIFFERENTIAL
BASOS ABS: 0.1 10*3/uL (ref 0.0–0.2)
Basos: 1 %
EOS (ABSOLUTE): 0.2 10*3/uL (ref 0.0–0.4)
Eos: 3 %
Hematocrit: 40.1 % (ref 37.5–51.0)
Hemoglobin: 14.1 g/dL (ref 13.0–17.7)
IMMATURE GRANULOCYTES: 1 %
Immature Grans (Abs): 0 10*3/uL (ref 0.0–0.1)
LYMPHS ABS: 2.1 10*3/uL (ref 0.7–3.1)
LYMPHS: 30 %
MCH: 30.5 pg (ref 26.6–33.0)
MCHC: 35.2 g/dL (ref 31.5–35.7)
MCV: 87 fL (ref 79–97)
Monocytes Absolute: 0.8 10*3/uL (ref 0.1–0.9)
Monocytes: 11 %
Neutrophils Absolute: 3.7 10*3/uL (ref 1.4–7.0)
Neutrophils: 54 %
RBC: 4.62 x10E6/uL (ref 4.14–5.80)
RDW: 14 % (ref 12.3–15.4)
WBC: 6.9 10*3/uL (ref 3.4–10.8)

## 2017-07-07 LAB — BASIC METABOLIC PANEL
BUN/Creatinine Ratio: 13 (ref 9–20)
BUN: 13 mg/dL (ref 6–24)
CALCIUM: 9.3 mg/dL (ref 8.7–10.2)
CHLORIDE: 102 mmol/L (ref 96–106)
CO2: 24 mmol/L (ref 20–29)
Creatinine, Ser: 0.99 mg/dL (ref 0.76–1.27)
GFR calc non Af Amer: 92 mL/min/{1.73_m2} (ref >59)
GFR, EST AFRICAN AMERICAN: 107 mL/min/{1.73_m2} (ref >59)
GLUCOSE: 92 mg/dL (ref 65–99)
POTASSIUM: 4.4 mmol/L (ref 3.5–5.2)
Sodium: 140 mmol/L (ref 134–144)

## 2017-07-08 ENCOUNTER — Ambulatory Visit (HOSPITAL_COMMUNITY): Payer: BLUE CROSS/BLUE SHIELD

## 2017-07-13 ENCOUNTER — Ambulatory Visit (HOSPITAL_COMMUNITY)
Admission: RE | Admit: 2017-07-13 | Discharge: 2017-07-13 | Disposition: A | Discharge status: Home / Self Care | Payer: BLUE CROSS/BLUE SHIELD | Source: Ambulatory Visit | Attending: Internal Medicine | Admitting: Internal Medicine

## 2017-07-13 DIAGNOSIS — I10 Essential (primary) hypertension: Secondary | ICD-10-CM | POA: Diagnosis not present

## 2017-07-13 DIAGNOSIS — I428 Other cardiomyopathies: Secondary | ICD-10-CM

## 2017-07-13 DIAGNOSIS — I469 Cardiac arrest, cause unspecified: Secondary | ICD-10-CM

## 2017-07-13 MED ORDER — GADOBENATE DIMEGLUMINE 529 MG/ML IV SOLN
20.0000 mL | Freq: Once | INTRAVENOUS | Status: AC
  Administered 2017-07-13: 20 mL via INTRAVENOUS

## 2017-07-17 ENCOUNTER — Encounter (HOSPITAL_COMMUNITY): Payer: Self-pay | Admitting: Cardiology

## 2017-07-17 ENCOUNTER — Ambulatory Visit (HOSPITAL_COMMUNITY): Payer: BLUE CROSS/BLUE SHIELD

## 2017-07-17 ENCOUNTER — Encounter (HOSPITAL_COMMUNITY)
Admission: RE | Disposition: A | Discharge status: Home / Self Care | Payer: Self-pay | Source: Ambulatory Visit | Attending: Internal Medicine

## 2017-07-17 ENCOUNTER — Ambulatory Visit (HOSPITAL_COMMUNITY)
Admission: RE | Admit: 2017-07-17 | Discharge: 2017-07-17 | Disposition: A | Discharge status: Home / Self Care | Payer: BLUE CROSS/BLUE SHIELD | Source: Ambulatory Visit | Attending: Internal Medicine | Admitting: Internal Medicine

## 2017-07-17 ENCOUNTER — Ambulatory Visit (HOSPITAL_COMMUNITY): Payer: BLUE CROSS/BLUE SHIELD | Admitting: Anesthesiology

## 2017-07-17 DIAGNOSIS — Z95810 Presence of automatic (implantable) cardiac defibrillator: Secondary | ICD-10-CM

## 2017-07-17 DIAGNOSIS — I4901 Ventricular fibrillation: Secondary | ICD-10-CM | POA: Diagnosis not present

## 2017-07-17 DIAGNOSIS — M1990 Unspecified osteoarthritis, unspecified site: Secondary | ICD-10-CM | POA: Insufficient documentation

## 2017-07-17 DIAGNOSIS — F17210 Nicotine dependence, cigarettes, uncomplicated: Secondary | ICD-10-CM | POA: Insufficient documentation

## 2017-07-17 DIAGNOSIS — I428 Other cardiomyopathies: Secondary | ICD-10-CM | POA: Diagnosis not present

## 2017-07-17 DIAGNOSIS — I469 Cardiac arrest, cause unspecified: Secondary | ICD-10-CM | POA: Diagnosis present

## 2017-07-17 DIAGNOSIS — Z7982 Long term (current) use of aspirin: Secondary | ICD-10-CM | POA: Insufficient documentation

## 2017-07-17 DIAGNOSIS — F1010 Alcohol abuse, uncomplicated: Secondary | ICD-10-CM | POA: Insufficient documentation

## 2017-07-17 DIAGNOSIS — R9431 Abnormal electrocardiogram [ECG] [EKG]: Secondary | ICD-10-CM | POA: Insufficient documentation

## 2017-07-17 DIAGNOSIS — I10 Essential (primary) hypertension: Secondary | ICD-10-CM | POA: Insufficient documentation

## 2017-07-17 DIAGNOSIS — I252 Old myocardial infarction: Secondary | ICD-10-CM | POA: Insufficient documentation

## 2017-07-17 HISTORY — PX: SUBQ ICD IMPLANT: EP1223

## 2017-07-17 LAB — PLATELET COUNT: PLATELETS: 199 10*3/uL (ref 150–400)

## 2017-07-17 LAB — SURGICAL PCR SCREEN
MRSA, PCR: NEGATIVE
Staphylococcus aureus: NEGATIVE

## 2017-07-17 SURGERY — SUBQ ICD IMPLANT
Anesthesia: General

## 2017-07-17 MED ORDER — EPHEDRINE SULFATE-NACL 50-0.9 MG/10ML-% IV SOSY
PREFILLED_SYRINGE | INTRAVENOUS | Status: DC | PRN
  Administered 2017-07-17 (×5): 10 mg via INTRAVENOUS

## 2017-07-17 MED ORDER — HYDROCODONE-ACETAMINOPHEN 5-325 MG PO TABS: 1.0000 | ORAL_TABLET | Freq: Four times a day (QID) | ORAL | 0 refills | Status: DC | PRN

## 2017-07-17 MED ORDER — CEFAZOLIN SODIUM-DEXTROSE 2-4 GM/100ML-% IV SOLN
INTRAVENOUS | Status: AC
  Filled 2017-07-17: qty 100

## 2017-07-17 MED ORDER — CHLORHEXIDINE GLUCONATE 4 % EX LIQD: 60.0000 mL | Freq: Once | CUTANEOUS | Status: DC

## 2017-07-17 MED ORDER — ROCURONIUM BROMIDE 10 MG/ML (PF) SYRINGE
PREFILLED_SYRINGE | INTRAVENOUS | Status: DC | PRN
  Administered 2017-07-17: 20 mg via INTRAVENOUS
  Administered 2017-07-17: 30 mg via INTRAVENOUS
  Administered 2017-07-17: 40 mg via INTRAVENOUS

## 2017-07-17 MED ORDER — DEXAMETHASONE SODIUM PHOSPHATE 10 MG/ML IJ SOLN
INTRAMUSCULAR | Status: DC | PRN
  Administered 2017-07-17: 10 mg via INTRAVENOUS

## 2017-07-17 MED ORDER — PHENYLEPHRINE 40 MCG/ML (10ML) SYRINGE FOR IV PUSH (FOR BLOOD PRESSURE SUPPORT)
PREFILLED_SYRINGE | INTRAVENOUS | Status: DC | PRN
  Administered 2017-07-17: 80 ug via INTRAVENOUS
  Administered 2017-07-17: 40 ug via INTRAVENOUS

## 2017-07-17 MED ORDER — BUPIVACAINE HCL (PF) 0.25 % IJ SOLN
INTRAMUSCULAR | Status: DC | PRN
  Administered 2017-07-17: 90 mL

## 2017-07-17 MED ORDER — CEFAZOLIN SODIUM-DEXTROSE 1-4 GM/50ML-% IV SOLN
1.0000 g | Freq: Four times a day (QID) | INTRAVENOUS | Status: DC
  Filled 2017-07-17: qty 50

## 2017-07-17 MED ORDER — LIDOCAINE HCL 1 % IJ SOLN
INTRAMUSCULAR | Status: AC
  Filled 2017-07-17: qty 60

## 2017-07-17 MED ORDER — SODIUM CHLORIDE 0.9 % IV SOLN
INTRAVENOUS | Status: DC
  Administered 2017-07-17 (×3): via INTRAVENOUS

## 2017-07-17 MED ORDER — MUPIROCIN 2 % EX OINT: 1.0000 "application " | TOPICAL_OINTMENT | Freq: Once | CUTANEOUS | Status: DC

## 2017-07-17 MED ORDER — MIDAZOLAM HCL 5 MG/5ML IJ SOLN
INTRAMUSCULAR | Status: DC | PRN
  Administered 2017-07-17: 2 mg via INTRAVENOUS

## 2017-07-17 MED ORDER — ETOMIDATE 2 MG/ML IV SOLN
INTRAVENOUS | Status: DC | PRN
  Administered 2017-07-17: 20 mg via INTRAVENOUS

## 2017-07-17 MED ORDER — ACETAMINOPHEN 325 MG PO TABS
325.0000 mg | ORAL_TABLET | ORAL | Status: DC | PRN
  Filled 2017-07-17: qty 2

## 2017-07-17 MED ORDER — BUPIVACAINE HCL (PF) 0.25 % IJ SOLN
INTRAMUSCULAR | Status: AC
  Filled 2017-07-17: qty 90

## 2017-07-17 MED ORDER — SODIUM CHLORIDE 0.9 % IV SOLN: INTRAVENOUS | Status: AC

## 2017-07-17 MED ORDER — LIDOCAINE 2% (20 MG/ML) 5 ML SYRINGE
INTRAMUSCULAR | Status: DC | PRN
  Administered 2017-07-17: 60 mg via INTRAVENOUS

## 2017-07-17 MED ORDER — FENTANYL CITRATE (PF) 250 MCG/5ML IJ SOLN
INTRAMUSCULAR | Status: DC | PRN
  Administered 2017-07-17: 50 ug via INTRAVENOUS

## 2017-07-17 MED ORDER — HEPARIN (PORCINE) IN NACL 2-0.9 UNIT/ML-% IJ SOLN
INTRAMUSCULAR | Status: AC
  Filled 2017-07-17: qty 500

## 2017-07-17 MED ORDER — SODIUM CHLORIDE 0.9 % IR SOLN
80.0000 mg | Status: AC
  Administered 2017-07-17: 80 mg

## 2017-07-17 MED ORDER — ONDANSETRON HCL 4 MG/2ML IJ SOLN
INTRAMUSCULAR | Status: DC | PRN
  Administered 2017-07-17: 4 mg via INTRAVENOUS

## 2017-07-17 MED ORDER — GLYCOPYRROLATE 0.2 MG/ML IV SOSY
PREFILLED_SYRINGE | INTRAVENOUS | Status: DC | PRN
  Administered 2017-07-17 (×2): .2 mg via INTRAVENOUS

## 2017-07-17 MED ORDER — CEFAZOLIN SODIUM-DEXTROSE 2-4 GM/100ML-% IV SOLN
2.0000 g | INTRAVENOUS | Status: AC
  Administered 2017-07-17: 2 g via INTRAVENOUS

## 2017-07-17 MED ORDER — PROPOFOL 10 MG/ML IV BOLUS
INTRAVENOUS | Status: DC | PRN
  Administered 2017-07-17: 30 mg via INTRAVENOUS
  Administered 2017-07-17: 20 mg via INTRAVENOUS

## 2017-07-17 MED ORDER — ONDANSETRON HCL 4 MG/2ML IJ SOLN: 4.0000 mg | Freq: Four times a day (QID) | INTRAMUSCULAR | Status: DC | PRN

## 2017-07-17 MED ORDER — SUGAMMADEX SODIUM 200 MG/2ML IV SOLN
INTRAVENOUS | Status: DC | PRN
Start: 2017-07-17 — End: 2017-07-17
  Administered 2017-07-17: 149.6 mg via INTRAVENOUS

## 2017-07-17 MED ORDER — SODIUM CHLORIDE 0.9 % IR SOLN
Status: AC
  Filled 2017-07-17: qty 2

## 2017-07-17 MED ORDER — HEPARIN (PORCINE) IN NACL 2-0.9 UNIT/ML-% IJ SOLN
INTRAMUSCULAR | Status: AC | PRN
  Administered 2017-07-17: 500 mL

## 2017-07-17 MED ORDER — MUPIROCIN 2 % EX OINT
TOPICAL_OINTMENT | CUTANEOUS | Status: AC
  Administered 2017-07-17: 1
  Filled 2017-07-17: qty 22

## 2017-07-17 SURGICAL SUPPLY — 5 items
HEMOSTAT SURGICEL 2X4 FIBR (HEMOSTASIS) ×6 IMPLANT
ICD SUBCU MRI EMBLEM A219 (ICD Generator) ×3 IMPLANT
LEAD SUBQU EMBLEM 3501 (Pacemaker) ×3 IMPLANT
PAD DEFIB LIFELINK (PAD) ×6 IMPLANT
TRAY PACEMAKER INSERTION (PACKS) ×3 IMPLANT

## 2017-07-17 NOTE — Anesthesia Procedure Notes (Signed)
Procedure Name: Intubation Date/Time: 07/17/2017 8:02 AM Performed by: Adria Dill, CRNA Pre-anesthesia Checklist: Patient identified, Emergency Drugs available, Suction available and Patient being monitored Patient Re-evaluated:Patient Re-evaluated prior to induction Oxygen Delivery Method: Circle system utilized Preoxygenation: Pre-oxygenation with 100% oxygen Induction Type: IV induction Ventilation: Mask ventilation without difficulty Laryngoscope Size: Glidescope and 4 Grade View: Grade IV Tube size: 7.5 mm Number of attempts: 2 Airway Equipment and Method: Rigid stylet Placement Confirmation: ETT inserted through vocal cords under direct vision,  positive ETCO2,  CO2 detector and breath sounds checked- equal and bilateral Secured at: 22 cm Tube secured with: Tape Dental Injury: Teeth and Oropharynx as per pre-operative assessment  Difficulty Due To: Difficulty was anticipated and Difficult Airway- due to anterior larynx Future Recommendations: Recommend- induction with short-acting agent, and alternative techniques readily available Comments: DL x1 Mil 3, Grade 4 view. Pt easily masked. DL x1 Glidescope 4, glottic opening easily visualized.

## 2017-07-17 NOTE — H&P (Signed)
Patient Care Team: Benita Stabile, MD as PCP - General (Internal Medicine)   HPI  Tony Reynolds is a 47 y.o. male Admitted for ICD implantation  He had presented 1/19 with Vfib and had recurrent Fib.  Cath nonobstructive disease  EF 25-35 % by cath and echo  Treated with amio and life vest as had cutaneous lesion requiring Abx  He has hx of excess alcohol use 48 ounces day >> 36  Now interested in stopping  No sob or chest pain no edema  Past Medical History:  Diagnosis Date  . Arthritis   . Back pain   . Eczema   . Hypertension   . Lumbar disc herniation   . MI (myocardial infarction) (HCC) 2018    Past Surgical History:  Procedure Laterality Date  . BILATERAL ANTERIOR TOTAL HIP ARTHROPLASTY Bilateral 12/14/2015   Procedure: BILATERAL ANTERIOR TOTAL HIP ARTHROPLASTY;  Surgeon: Kathryne Hitch, MD;  Location: [facility];  Service: Orthopedics;  Laterality: Bilateral;  . CORONARY/GRAFT ACUTE MI REVASCULARIZATION N/A 05/19/2017   Procedure: Coronary/Graft Acute MI Revascularization;  Surgeon: Tonny Bollman, MD;  Location: [facility] INVASIVE CV LAB;  Service: Cardiovascular;  Laterality: N/A;  . LEFT HEART CATH AND CORONARY ANGIOGRAPHY N/A 05/19/2017   Procedure: LEFT HEART CATH AND CORONARY ANGIOGRAPHY;  Surgeon: Tonny Bollman, MD;  Location: [facility] INVASIVE CV LAB;  Service: Cardiovascular;  Laterality: N/A;  . NO PAST SURGERIES      Current Facility-Administered Medications  Medication Dose Route Frequency Provider Last Rate Last Dose  . 0.9 %  sodium chloride infusion   Intravenous Continuous Gypsy Balsam K, NP 50 mL/hr at 07/17/17 [phone]    . ceFAZolin (ANCEF) IVPB 2g/100 mL premix  2 g Intravenous On Call Gypsy Balsam K, NP      . chlorhexidine (HIBICLENS) 4 % liquid 4 application  60 mL Topical Once Glory Buff, [facility] K, NP      . gentamicin (GARAMYCIN) 80 mg in sodium chloride irrigation 0.9 % 500 mL irrigation  80 mg Irrigation On Call Gypsy Balsam K, NP      . mupirocin  ointment (BACTROBAN) 2 % 1 application  1 application Topical Once Duke Salvia, MD        Allergies  Allergen Reactions  . Tomato Other (See Comments)    No acidic foods - eczema      Social History   Tobacco Use  . Smoking status: Current Every Day Smoker    Packs/day: 0.50    Types: Cigarettes    Start date: 04/01/1989  . Smokeless tobacco: Never Used  . Tobacco comment: 10 ciggs per day  Substance Use Topics  . Alcohol use: Yes    Alcohol/week: 0.0 oz    Comment: 14  total  of 12 oz beer weekly  . Drug use: No     History reviewed. No pertinent family history.   Current Meds  Medication Sig  . amiodarone (PACERONE) 400 MG tablet Take 1 tablet (400 mg total) by mouth 2 (two) times daily.  Marland Kitchen aspirin 81 MG chewable tablet Chew 1 tablet (81 mg total) by mouth daily.  Marland Kitchen atorvastatin (LIPITOR) 20 MG tablet Take 1 tablet (20 mg total) by mouth daily at 6 PM.  . carvedilol (COREG) 12.5 MG tablet Take 0.5 tablets (6.25 mg total) by mouth 2 (two) times daily.  . cloNIDine (CATAPRES - DOSED IN MG/24 HR) 0.1 mg/24hr patch Place 1 patch (0.1 mg total) onto the skin once  a week.  . divalproex (DEPAKOTE) 250 MG DR tablet Take 1 tablet (250 mg total) by mouth every 12 (twelve) hours.  . folic acid (FOLVITE) 1 MG tablet Take 1 tablet (1 mg total) by mouth daily.  Marland Kitchen losartan (COZAAR) 25 MG tablet Take 1 tablet (25 mg total) by mouth daily.     Review of Systems negative except from HPI and PMH  Physical Exam BP (!) 145/81 (BP Location: Right Arm)   Pulse (!) 50   Temp 98.3 F (36.8 C) (Oral)   Ht 5\' 6"  (1.676 m)   Wt 165 lb (74.8 kg)   SpO2 100%   BMI 26.63 kg/m  Well developed and nourished in no acute distress HENT normal Neck supple with JVP-flat Clear Regular rate and rhythm, no murmurs or gallops Abd-soft with active BS No Clubbing cyanosis edema Skin-warm and dry wwithout skin breaks  A & Oriented  Grossly normal sensory and motor function     Assessment  and  Plan  NICM  Cardiac Arrest  Skin lesions  Sinus bradycardia  Alcohol Abuse  Hypertension  Abnormal ECG    Recurrent VF  Here for ICD secondary prevention implant   Have reviewed the potential benefits and risks of ICD implantation including but not limited to death,  lead dislodgement, infection,  device malfunction and inappropriate shocks.  The patient and family express understanding  and are willing to proceed.

## 2017-07-17 NOTE — Transfer of Care (Signed)
Immediate Anesthesia Transfer of Care Note  Patient: RENFORD GRAMMATICO  Procedure(s) Performed: Leontine Locket ICD IMPLANT (N/A )  Patient Location: PACU and Cath Lab  Anesthesia Type:General  Level of Consciousness: awake, alert , oriented and patient cooperative  Airway & Oxygen Therapy: Patient Spontanous Breathing and Patient connected to nasal cannula oxygen  Post-op Assessment: Report given to RN and Post -op Vital signs reviewed and stable  Post vital signs: Reviewed and stable  Last Vitals:  Vitals:   07/17/17 0538  BP: (!) 145/81  Pulse: (!) 50  Temp: 36.8 C  SpO2: 100%    Last Pain:  Vitals:   07/17/17 0538  TempSrc: Oral         Complications: No apparent anesthesia complications

## 2017-07-17 NOTE — Progress Notes (Addendum)
CXR obtained as instructed per Dr. Graciela Husbands.  OK to D/C per Dr. Graciela Husbands after CXR done.  Pt states he had box and saw Joey.

## 2017-07-17 NOTE — Progress Notes (Signed)
Joey here to see pt.  Instructed that pt has left the facility.  Will give box at wound check visit.

## 2017-07-17 NOTE — Discharge Instructions (Addendum)
Return to Work ____[name]_______________________________________________ was treated at our facility. 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  Employee may return to modified work on ______________________. Work activity restrictions Work activities that are not tolerated include: ___Bending ___Prolonged sitting ___Lifting more than ___5_____ lb ___Squatting ___ Prolonged standing ___Climbing ___Reaching ___Pushing and pulling ___ Walking ___Other ______________________ These restrictions are effective until ____after 3/11/19__________________. Show this Return to Work statement to your supervisor at work as soon as possible. Your employer should be aware of your condition and can help with the necessary work activity restrictions. If you wish to return to work sooner than the date that is listed above, or if you have further problems that make it difficult for you to return at that time, please call our clinic or your health care provider. _________________________________________ Health Care Provider Name (printed) _________________________________________ Health Care Provider (signature) _________________________________________ Date This information is not intended to replace advice given to you by your health care provider. Make sure you discuss any questions you have with your health care provider. Document Released: 05/05/2005 Document Revised: 04/18/2016 Document Reviewed: 12/02/2013 Elsevier Interactive Patient Education  2018 [employer] Subcutaneous Cardioverter Defibrillator Implantation, Care After This sheet gives you information about how to care for yourself after your procedure. Your health care provider may also give you more specific instructions. If you have problems or questions, contact your health care  provider. What can I expect after the procedure? After the procedure, it is common to have:  Some pain. It may last a few days.  A slight bump under the skin where the subcutaneous implantation cardioverter defibrillator (S-ICD) is. You may be able to feel the device under the skin. This is normal.  Follow these instructions at home: Medicines  Take over-the-counter and prescription medicines only as told by your health care provider.  If you were prescribed an antibiotic medicine, take it as told by your health care provider. Do not stop taking the antibiotic even if you start to feel better. Incision care  Follow instructions from your health care provider about how to take care of your incision. Make sure you: ? Wash your hands with soap and water before you change your bandage (dressing). If soap and water are not available, use hand sanitizer. ? Change your dressing as told by your health care provider. ? Leave stitches (sutures), skin glue, or adhesive strips in place. These skin closures may need to stay in place for 2 weeks or longer. If adhesive strip edges start to loosen and curl up, you may trim the loose edges. Do not remove adhesive strips completely unless your health care provider tells you to do that.  Check your incision area every day for signs of infection. Check for: ? Redness, swelling, or pain. ? Fluid or blood. ? Warmth. ? Pus or a bad smell. Activity  Do not lift anything that is heavier than 10 lb (4.5 kg), or the limit that you are told, until your health care provider says that it is safe.  Avoid sports and any other activity that could cause a hit to the generator or leads. Ask your health care provider what activities are safe for you and when you may return to your normal activities.  Follow instructions from your health care provider about exercise and sexual activity restrictions after your procedure. Tourist information centre manager  Tell all health  care  providers, including your dentist, that you have a defibrillator. They need to know this so they do not give you an MRI scan, which uses strong magnets.  When using your cell phone, hold it to the ear that is on the opposite side from the defibrillator. Do not leave your cell phone in a pocket over the defibrillator.  If you must pass through a metal detector, quickly walk through it. Do not stop under the detector, and do not stand near it.  Avoid places or objects that have a strong electric or magnetic field, including: ? Airport Actuary. At the airport, tell officials that you have a defibrillator. Your defibrillator ID card will let you be checked in a way that is safe for you and will not damage your defibrillator. Also, do not let a security person wave a magnetic wand near your defibrillator. That can make it stop working. ? Power plants. ? Large electrical generators. ? Anti-theft systems or electronic article surveillance (EAS). ? Radiofrequency transmission towers, such as cell phone and radio towers.  Do not use amateur (ham) radio equipment or electric (arc) welding torches.  Some devices are safe to use if they are held 12 inches (30 cm) or more away from your defibrillator. These include power tools, lawn mowers, and speakers. If you are not sure if something is safe to use, ask your health care provider.  Do not use MP3 player headphones. They have magnets.  You may safely use electric blankets, heating pads, computers, and microwave ovens. General instructions  Do not take baths, swim, shower, or use a hot tub until your health care provider approves. You may need to take sponge baths until your health care provider says that you may bathe or shower.  Do not drive until your health care provider approves.  Always keep your defibrillator ID card with you. The card should list the implant date, device model, and manufacturer. Consider wearing a medical alert  bracelet or necklace that says that you have an S-ICD.  Do not use any products that contain nicotine or tobacco, such as cigarettes and e-cigarettes. If you need help quitting, ask your health care provider.  Have your defibrillator checked as often as told by your health care provider. Most S-ICDs last for 4-8 years before they need to be replaced. Contact a health care provider if:  You feel one shock in your chest.  You gain weight suddenly.  You have a fever.  You have severe pain, and medicines do not help.  You have redness, swelling, or pain around your incision area.  You have pus or a bad smell coming from your incision area.  You have fluid or blood coming from your incision.  Your incision area feels warm to the touch.  Your heart feels like it is fluttering or skipping beats (heart palpitations).  You feel increased anxiety or depression. Get help right away if:  You feel more than one shock.  You have chest pain.  You have problems breathing or have shortness of breath.  You have dizziness or fainting. Summary  After the procedure, you may have some pain, see a bump under your skin, and feel the device under your skin.  Check your incision area every day for signs of infection.  Be careful around electric and magnetic fields.  Always keep your defibrillator ID card with you. This information is not intended to replace advice given to you by your health care provider. Make sure you discuss  any questions you have with your health care provider. Document Released: 08/07/2016 Document Revised: 08/07/2016 Document Reviewed: 08/07/2016 Elsevier Interactive Patient Education  [employer].

## 2017-07-17 NOTE — Anesthesia Preprocedure Evaluation (Signed)
Anesthesia Evaluation  Patient identified by MRN, date of birth, ID band Patient awake    Reviewed: Allergy & Precautions, H&P , NPO status , Patient's Chart, lab work & pertinent test results  Airway Mallampati: II   Neck ROM: full    Dental   Pulmonary Current Smoker,    breath sounds clear to auscultation       Cardiovascular hypertension, + CAD, + Past MI and +CHF   Rhythm:regular Rate:Normal  EF 20-25%   Neuro/Psych    GI/Hepatic   Endo/Other    Renal/GU      Musculoskeletal   Abdominal   Peds  Hematology   Anesthesia Other Findings   Reproductive/Obstetrics                             Anesthesia Physical Anesthesia Plan  ASA: III  Anesthesia Plan: General   Post-op Pain Management:    Induction: Intravenous  PONV Risk Score and Plan: 1 and Ondansetron, Dexamethasone, Midazolam and Treatment may vary due to age or medical condition  Airway Management Planned: LMA  Additional Equipment:   Intra-op Plan:   Post-operative Plan:   Informed Consent: I have reviewed the patients History and Physical, chart, labs and discussed the procedure including the risks, benefits and alternatives for the proposed anesthesia with the patient or authorized representative who has indicated his/her understanding and acceptance.     Plan Discussed with: CRNA, Anesthesiologist and Surgeon  Anesthesia Plan Comments:         Anesthesia Quick Evaluation

## 2017-07-20 ENCOUNTER — Telehealth: Payer: Self-pay | Admitting: Internal Medicine

## 2017-07-20 NOTE — Telephone Encounter (Signed)
Pt's wife is calling requesting a refill on hydrocodone. Wife would like Dr. Graciela Husbands to give her a call back. Please address

## 2017-07-20 NOTE — Telephone Encounter (Signed)
New message   *STAT* If patient is at the pharmacy, call can be transferred to refill team.   1. Which medications need to be refilled? (please list name of each medication and dose if known) HYDROcodone-acetaminophen (NORCO/VICODIN) 5-325 MG tablet  2. Which pharmacy/location (including street and city if local pharmacy) is medication to be sent to? [facility] - [place], [place] - [address]  3. Do they need a 30 day or 90 day supply? 30

## 2017-07-20 NOTE — Telephone Encounter (Signed)
Spoke with pt's wife with pt in the background. I explained to them that it is against the law to call in a controlled substance. Per Dr Graciela Husbands, if he is in a lot of pain, he would like for him to come into the office for a wound check. I offered the pt an appointment for 3/5 at 1:30pm but he declined. I suggested to the pt and his wife that he use ice packs, alternating on and off 15 min at a time, and to use tylenol or Ibuprofen as directed for pain control. The patient and his wife were both openly upset and stated they would not come down to [place]. I apologized and told them to call me if they change their mind.

## 2017-07-21 ENCOUNTER — Ambulatory Visit: Payer: Self-pay

## 2017-07-22 ENCOUNTER — Encounter: Payer: Self-pay | Admitting: Internal Medicine

## 2017-07-22 NOTE — Anesthesia Postprocedure Evaluation (Signed)
Anesthesia Post Note  Patient: Tony Reynolds  Procedure(s) Performed: Leontine Locket ICD IMPLANT (N/A )     Patient location during evaluation: PACU Anesthesia Type: General Level of consciousness: awake and alert Pain management: pain level controlled Vital Signs Assessment: post-procedure vital signs reviewed and stable Respiratory status: spontaneous breathing, nonlabored ventilation, respiratory function stable and patient connected to nasal cannula oxygen Cardiovascular status: blood pressure returned to baseline and stable Postop Assessment: no apparent nausea or vomiting Anesthetic complications: no    Last Vitals:  Vitals:   07/17/17 1130 07/17/17 1300  BP: 125/69 119/86  Pulse: (!) 47 (!) 52  Resp:    Temp:    SpO2: 98% 100%    Last Pain:  Vitals:   07/17/17 1010  TempSrc: Tympanic                 Beldon Nowling S

## 2017-07-22 NOTE — Progress Notes (Signed)
ICD Criteria  Current LVEF:40%. Within 12 months prior to implant: Yes   Heart failure history: Yes, Class I  Cardiomyopathy history: Yes, Non-Ischemic Cardiomyopathy.  Atrial Fibrillation/Atrial Flutter: No.  Ventricular tachycardia history: Yes, Hemodynamic instability present. 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  Cardiac arrest history: Yes, Ventricular Fibrillation.  History of syndromes with risk of sudden death: No.  Previous ICD: No.  Current ICD indication: Secondary  PPM indication: No.   Class I or II Bradycardia indication present: No  Beta Blocker therapy for 3 or more months: Yes, prescribed.   Ace Inhibitor/ARB therapy for 3 or more months: Yes, prescribed.

## 2017-07-27 ENCOUNTER — Ambulatory Visit (INDEPENDENT_AMBULATORY_CARE_PROVIDER_SITE_OTHER): Payer: BLUE CROSS/BLUE SHIELD | Admitting: *Deleted

## 2017-07-27 DIAGNOSIS — I469 Cardiac arrest, cause unspecified: Secondary | ICD-10-CM | POA: Diagnosis not present

## 2017-07-27 LAB — CUP PACEART INCLINIC DEVICE CHECK
Date Time Interrogation Session: 20190311110513
Implantable Lead Location: 753862
Implantable Lead Model: 3401
Implantable Lead Serial Number: 119059
MDC IDC LEAD IMPLANT DT: 20190301
MDC IDC PG IMPLANT DT: 20190301
MDC IDC PG SERIAL: 229890

## 2017-07-27 NOTE — Progress Notes (Signed)
Wound Subcutaneous ICD check in clinic. Dermabond removed. Wound without redness. Incision edges approximated, Normal device function. Some edema noted pt to call if this does not resolve in the next two weeks.  Pt educated about wound care, arm mobility and shock plan. Electrode impedance status okay. No programming changes. No AF. Remaining longevity to ERI 100%. ROV 10/23/2016 w/ SK

## 2017-07-30 ENCOUNTER — Telehealth: Payer: Self-pay | Admitting: Internal Medicine

## 2017-07-30 DIAGNOSIS — Z01818 Encounter for other preprocedural examination: Secondary | ICD-10-CM

## 2017-07-30 NOTE — Telephone Encounter (Signed)
Tony Reynolds is returning a call

## 2017-07-30 NOTE — Telephone Encounter (Signed)
New message    Pt c/o medication issue:  1. Name of Medication: losartan (COZAAR) 25 MG tablet  2. How are you currently taking this medication (dosage and times per day)? Take 1 tablet (25 mg total) by mouth daily. 3. Are you having a reaction (difficulty breathing--STAT)? NO  4. What is your medication issue? Recall concerns

## 2017-07-30 NOTE — Telephone Encounter (Signed)
Called pt wife back, no answer and mailbox is full.

## 2017-07-31 NOTE — Telephone Encounter (Signed)
LM with Lenn Sink VM; I have called [name] and they stated his dose of Losartan was not recalled and safe to continue. I advised her to call the pharmacy with any additional questions.

## 2017-07-31 NOTE — Addendum Note (Signed)
Addended by: Oretha Milch on: 07/31/2017 11:41 AM   Modules accepted: Orders

## 2017-09-01 ENCOUNTER — Telehealth: Payer: Self-pay | Admitting: Internal Medicine

## 2017-09-01 NOTE — Telephone Encounter (Signed)
Patient wife calling, states that she would like to speak with nurse about patient procedures

## 2017-09-01 NOTE — Telephone Encounter (Signed)
Returned call, was unable to leave VM.

## 2017-10-20 ENCOUNTER — Telehealth: Payer: Self-pay | Admitting: Internal Medicine

## 2017-10-20 NOTE — Telephone Encounter (Signed)
New message  Patients spouse is calling, would like to know what type of medication can be used for soreness near site.  1. Has your device fired? NO  2. Is you device beeping? NO  3. Are you experiencing draining or swelling at device site? A LITTLE SWELLING  4. Are you calling to see if we received your device transmission? NO  5. Have you passed out? NO    Please route to Device Clinic Pool

## 2017-10-20 NOTE — Telephone Encounter (Signed)
Spoke with pt regarding swelling of device site pt stated that he was not running a fever and there was no drainage from the site pt stated that when he would bend over there was some pain, pt has apt scheduled for 10/23/17 with Dr. Graciela Husbands, informed pt that Dr. Graciela Husbands is in the office this afternoon and I could discuss with him to see if he would want to the the pt sooner, pt stated that his wife was at work and he had no transportation to [place] today, advised pt to keep apt on Friday and let Dr. Graciela Husbands know that he felt like the device site was more swollen, pt voiced understanding.

## 2017-10-23 ENCOUNTER — Encounter: Payer: Self-pay | Admitting: Internal Medicine

## 2017-10-23 ENCOUNTER — Encounter (INDEPENDENT_AMBULATORY_CARE_PROVIDER_SITE_OTHER): Payer: Self-pay

## 2017-10-23 ENCOUNTER — Ambulatory Visit (INDEPENDENT_AMBULATORY_CARE_PROVIDER_SITE_OTHER): Payer: Self-pay | Admitting: Internal Medicine

## 2017-10-23 VITALS — BP 144/92 | HR 44 | Ht 66.0 in | Wt 169.4 lb

## 2017-10-23 DIAGNOSIS — I469 Cardiac arrest, cause unspecified: Secondary | ICD-10-CM

## 2017-10-23 DIAGNOSIS — I4901 Ventricular fibrillation: Secondary | ICD-10-CM

## 2017-10-23 DIAGNOSIS — Z95810 Presence of automatic (implantable) cardiac defibrillator: Secondary | ICD-10-CM

## 2017-10-23 LAB — BASIC METABOLIC PANEL
BUN / CREAT RATIO: 14 (ref 9–20)
BUN: 17 mg/dL (ref 6–24)
CHLORIDE: 97 mmol/L (ref 96–106)
CO2: 24 mmol/L (ref 20–29)
Calcium: 9.6 mg/dL (ref 8.7–10.2)
Creatinine, Ser: 1.22 mg/dL (ref 0.76–1.27)
GFR calc Af Amer: 83 mL/min/{1.73_m2} (ref >59)
GFR calc non Af Amer: 72 mL/min/{1.73_m2} (ref >59)
GLUCOSE: 93 mg/dL (ref 65–99)
Potassium: 5.2 mmol/L (ref 3.5–5.2)
SODIUM: 135 mmol/L (ref 134–144)

## 2017-10-23 LAB — CBC WITH DIFFERENTIAL/PLATELET
BASOS ABS: 0.1 10*3/uL (ref 0.0–0.2)
Basos: 1 %
EOS (ABSOLUTE): 0.1 10*3/uL (ref 0.0–0.4)
Eos: 3 %
Hematocrit: 40.9 % (ref 37.5–51.0)
Hemoglobin: 14.5 g/dL (ref 13.0–17.7)
Immature Grans (Abs): 0 10*3/uL (ref 0.0–0.1)
Immature Granulocytes: 0 %
LYMPHS ABS: 1.6 10*3/uL (ref 0.7–3.1)
Lymphs: 28 %
MCH: 29.8 pg (ref 26.6–33.0)
MCHC: 35.5 g/dL (ref 31.5–35.7)
MCV: 84 fL (ref 79–97)
MONOCYTES: 12 %
MONOS ABS: 0.7 10*3/uL (ref 0.1–0.9)
Neutrophils Absolute: 3.2 10*3/uL (ref 1.4–7.0)
Neutrophils: 56 %
Platelets: 274 10*3/uL (ref 150–450)
RBC: 4.87 x10E6/uL (ref 4.14–5.80)
RDW: 13.5 % (ref 12.3–15.4)
WBC: 5.7 10*3/uL (ref 3.4–10.8)

## 2017-10-23 LAB — CUP PACEART INCLINIC DEVICE CHECK
Date Time Interrogation Session: 20190607140554
Implantable Pulse Generator Implant Date: 20190301
MDC IDC LEAD IMPLANT DT: 20190301
MDC IDC LEAD LOCATION: 753862
MDC IDC LEAD SERIAL: 119059
Pulse Gen Serial Number: 229890

## 2017-10-23 LAB — HEPATIC FUNCTION PANEL
ALT: 102 IU/L — ABNORMAL HIGH (ref 0–44)
AST: 107 IU/L — ABNORMAL HIGH (ref 0–40)
Albumin: 4.5 g/dL (ref 3.5–5.5)
Alkaline Phosphatase: 147 IU/L — ABNORMAL HIGH (ref 39–117)
Bilirubin Total: 0.3 mg/dL (ref 0.0–1.2)
Bilirubin, Direct: 0.12 mg/dL (ref 0.00–0.40)
TOTAL PROTEIN: 8 g/dL (ref 6.0–8.5)

## 2017-10-23 LAB — TSH: TSH: 1.83 u[IU]/mL (ref 0.450–4.500)

## 2017-10-23 MED ORDER — LOSARTAN POTASSIUM 25 MG PO TABS: 50.0000 mg | ORAL_TABLET | Freq: Every day | ORAL | 3 refills | Status: DC

## 2017-10-23 MED ORDER — AMIODARONE HCL 400 MG PO TABS: 200.0000 mg | ORAL_TABLET | Freq: Every day | ORAL | 3 refills | Status: DC

## 2017-10-23 NOTE — Progress Notes (Signed)
Patient Care Team: Benita Stabile, MD as PCP - General (Internal Medicine)   HPI  Tony Reynolds is a 47 y.o. male Seen in follow-up for cardiac arrest 1/19.  Catheterization had demonstrated nonobstructive disease.  There was recurrent ventricular fibrillation that was PVC triggered but not pause dependent.  It was not consistent with torsade.  Device implantation was initially deferred; he underwent S ICD implantation 2/19   DATE TEST EF   1/19 LHC 30-35% D1 disease-severe O/w modest  1/19 Echo   20-25 %   2/19 cMRI  55-60 % W/o def LGE         Patient denies symptoms of GI intolerance, sun sensitivity, neurological symptoms attributable to amiodarone.  Surveillance laboratories have not been checked.    Date Cr K TSH LFTs PFTs  2/19  0.99 4.4                 He has a history of alcohol abuse; he is down from 48--32  Denies chest pain or shortness of breath.  He has discomfort in his defibrillator site.  No edema.  Records and Results Reviewed   Past Medical History:  Diagnosis Date  . Arthritis   . Back pain   . Eczema   . Hypertension   . Lumbar disc herniation   . MI (myocardial infarction) (HCC) 2018    Past Surgical History:  Procedure Laterality Date  . BILATERAL ANTERIOR TOTAL HIP ARTHROPLASTY Bilateral 12/14/2015   Procedure: BILATERAL ANTERIOR TOTAL HIP ARTHROPLASTY;  Surgeon: Kathryne Hitch, MD;  Location: [facility];  Service: Orthopedics;  Laterality: Bilateral;  . CORONARY/GRAFT ACUTE MI REVASCULARIZATION N/A 05/19/2017   Procedure: Coronary/Graft Acute MI Revascularization;  Surgeon: Tonny Bollman, MD;  Location: [facility] INVASIVE CV LAB;  Service: Cardiovascular;  Laterality: N/A;  . LEFT HEART CATH AND CORONARY ANGIOGRAPHY N/A 05/19/2017   Procedure: LEFT HEART CATH AND CORONARY ANGIOGRAPHY;  Surgeon: Tonny Bollman, MD;  Location: [facility] INVASIVE CV LAB;  Service: Cardiovascular;  Laterality: N/A;  . NO PAST SURGERIES    . SUBQ ICD IMPLANT N/A  07/17/2017   Procedure: SUBQ ICD IMPLANT;  Surgeon: Duke Salvia, MD;  Location: [facility] INVASIVE CV LAB;  Service: Cardiovascular;  Laterality: N/A;    Current Outpatient Medications  Medication Sig Dispense Refill  . amiodarone (PACERONE) 400 MG tablet Take 1 tablet (400 mg total) by mouth 2 (two) times daily. 180 tablet 3  . aspirin 81 MG chewable tablet Chew 1 tablet (81 mg total) by mouth daily. 30 tablet 0  . atorvastatin (LIPITOR) 20 MG tablet Take 1 tablet (20 mg total) by mouth daily at 6 PM. 90 tablet 3  . carvedilol (COREG) 12.5 MG tablet Take 0.5 tablets (6.25 mg total) by mouth 2 (two) times daily. 90 tablet 3  . divalproex (DEPAKOTE) 250 MG DR tablet Take 1 tablet (250 mg total) by mouth every 12 (twelve) hours. 60 tablet 0  . folic acid (FOLVITE) 1 MG tablet Take 1 tablet (1 mg total) by mouth daily. 30 tablet 0  . losartan (COZAAR) 25 MG tablet Take 1 tablet (25 mg total) by mouth daily. 90 tablet 3  . spironolactone (ALDACTONE) 25 MG tablet Take 0.5 tablets (12.5 mg total) by mouth daily. 45 tablet 3   No current facility-administered medications for this visit.     Allergies  Allergen Reactions  . Tomato Other (See Comments)    No acidic foods - eczema  Review of Systems negative except from HPI and PMH  Physical Exam BP (!) 144/92   Pulse (!) 44   Ht 5\' 6"  (1.676 m)   Wt 169 lb 6.4 oz (76.8 kg)   SpO2 97%   BMI 27.34 kg/m  Well developed and nourished in no acute distress HENT normal Neck supple with JVP-flat Clear Device pocket well healed; without hematoma or erythema.  There is no tethering there is tenderness Regular rate and rhythm, no murmurs or gallops Abd-soft with active BS No Clubbing cyanosis edema Skin-warm and dry A & Oriented  Grossly normal sensory and motor function   ECG demonstrates sinus at 43 Intervals 22/11/52's (44)  Assessment and  Plan  NICM  Cardiac Arrest   Sinus bradycardia  Alcohol  Abuse  Hypertension  Abnormal ECG  S-ICD   Tremor  Erectile dysfunction    For ED we will stop his carvedilol.  His LV function seems to have been depressed primarily in the context of his arrest.  We would also expect this to improve his sinus bradycardia  His blood pressure is elevated; we will increase his losartan 25--50.  His amiodarone dose needs to be clarified.>> was indeed 400 bid    It was used for PVCs as triggering.  Target dose would be 100 mg a day.  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  This is often dose related.  Check amiodarone surveillance laboratories today.  There is ICD pocket tenderness.  He has restricted his motion because of this and has increasingly limited motion.  Stressed the importance of physical therapy.  There is no warmth or erythema to suggest infection.  There is no tethering.  We spent more than 50% of our >25 min visit in face to face counseling regarding the above     Current medicines are reviewed at length with the patient today .  T

## 2017-10-23 NOTE — Patient Instructions (Addendum)
Medication Instructions:  Your physician has recommended you make the following change in your medication:   1. Stop Carvedilol 2. Begin taking Amiodarone, 200mg ,one tablet, once daily 3. Increase your Losartan (Cozaar) to 50mg , two tablets, once daily.  Labwork: You will have labs drawn today: CBC, BMP, LFTs, and TSH   Testing/Procedures: Your physician has recommended that you have a pulmonary function test. Pulmonary Function Tests are a group of tests that measure how well air moves in and out of your lungs.   Follow-Up: Your physician wants you to follow-up in: 3 months with Francis Dowse, PA. You will receive a reminder letter in the mail two months in advance. If you don't receive a letter, please call our office to schedule the follow-up appointment.  Remote monitoring is used to monitor your ICD from home. This monitoring reduces the number of office visits required to check your device to one time per year. It allows [place] to keep an eye on the functioning of your device to ensure it is working properly. You are scheduled for a device check from home on 01/22/2018. You may send your transmission at any time that day. If you have a wireless device, the transmission will be sent automatically. After your physician reviews your transmission, you will receive a postcard with your next transmission date.    Any Other Special Instructions Will Be Listed Below (If Applicable).    Pulmonary Function Tests Pulmonary function tests (PFTs) are used to measure how well your lungs work, find out what is causing your lung problems, and figure out the best treatment for you. You may have PFTs:  When you have an illness involving the lungs.  To follow changes in your lung function over time if you have a chronic lung disease.  If you are an IT trainer. This checks the effects of being exposed to chemicals over a long period of time.  To check lung function before having surgery or  other procedures.  To check your lungs if you smoke.  To check if prescribed medicines or treatments are helping your lungs.  Your results will be compared to the expected lung function of someone with healthy lungs who is similar to you in:  Age.  Gender.  Height.  Weight.  Race or ethnicity.  This is done to show how your lungs compare to normal lung function (percent predicted). This is how your health care provider knows if your lung function is normal or not. If you have had PFTs done before, your health care provider will compare your current results with past results. This shows if your lung function is better, worse, or the same as before. Tell a health care provider about:  Any allergies you have.  All medicines you are taking, including inhaler or nebulizer medicines, vitamins, herbs, eye drops, creams, and over-the-counter medicines.  Any blood disorders you have.  Any surgeries you have had, especially recent eye surgery, abdominal surgery, or chest surgery. These can make PFTs difficult or unsafe.  Any medical conditions you have, including chest pain or heart problems, tuberculosis, or respiratory infections such as pneumonia, a cold, or the flu.  Any fear of being in closed spaces (claustrophobia). Some of your tests may be in a closed space. What are the risks? Generally, this is a safe procedure. However, problems may occur, including:  Light-headedness due to over-breathing (hyperventilation).  An asthma attack from deep breathing.  A collapsed lung.  What happens before the procedure?  Take  over-the-counter and prescription medicines only as told by your health care provider. If you take inhaler or nebulizer medicines, ask your health care provider which medicines you should take on the day of your testing. Some inhaler medicines may interfere with PFTs if they are taken shortly before the tests.  Follow your health care provider's instructions on  eating and drinking restrictions. This may include avoiding eating large meals and drinking alcohol before the testing.  Do not use any products that contain nicotine or tobacco, such as cigarettes and e-cigarettes. If you need help quitting, ask your health care provider.  Wear comfortable clothing that will not interfere with breathing. What happens during the procedure?  You will be given a soft nose clip to wear. This is done so all of your breaths will go through your mouth instead of your nose.  You will be given a germ-free (sterile) mouthpiece. It will be attached to a machine that measures your breathing (spirometer).  You will be asked to do various breathing maneuvers. The maneuvers will be done by breathing in (inhaling) and breathing out (exhaling). You may be asked to repeat the maneuvers several times before the testing is done.  It is important to follow the instructions exactly to get accurate results. Make sure to blow as hard and as fast as you can when you are told to do so.  You may be given a medicine that makes the small air passages in your lungs larger (bronchodilator) after testing has been done. This medicine will make it easier for you to breathe.  The tests will be repeated after the bronchodilator has taken effect.  You will be monitored carefully during the procedure for faintness, dizziness, trouble breathing, or any other problems. The procedure may vary among health care providers and hospitals. What happens after the procedure?  It is up to you to get your test results. Ask your health care provider, or the department that is doing the tests, when your results will be ready. After you have received your test results, talk with your health care provider about treatment options, if necessary. Summary  Pulmonary function tests (PFTs) are used to measure how well your lungs work, find out what is causing your lung problems, and figure out the best treatment  for you.  Wear comfortable clothing that will not interfere with breathing.  It is up to you to get your test results. After you have received them, talk with your health care provider about treatment options, if necessary. This information is not intended to replace advice given to you by your health care provider. Make sure you discuss any questions you have with your health care provider. Document Released: 12/27/2003 Document Revised: 03/27/2016 Document Reviewed: 03/27/2016 Elsevier Interactive Patient Education  [employer].   If you need a refill on your cardiac medications before your next appointment, please call your pharmacy.

## 2017-10-26 ENCOUNTER — Telehealth: Payer: Self-pay

## 2017-10-26 NOTE — Telephone Encounter (Signed)
Labs reviewed with DOD (Dr. Elberta Fortis). Per Dr. Elberta Fortis have pt stop amiodarone and f/u with Dr. Graciela Husbands due to elevated liver enzymes . Pt and spouse instructed to stop amio and will forward to Dr. Graciela Husbands and his nurse for additional recommendations. She stated understanding and thankful for the call.

## 2017-10-27 ENCOUNTER — Other Ambulatory Visit: Payer: Self-pay | Admitting: Internal Medicine

## 2017-10-27 ENCOUNTER — Ambulatory Visit (HOSPITAL_COMMUNITY)
Admission: RE | Admit: 2017-10-27 | Discharge: 2017-10-27 | Disposition: A | Discharge status: Home / Self Care | Payer: Self-pay | Source: Ambulatory Visit | Attending: Internal Medicine | Admitting: Internal Medicine

## 2017-10-27 DIAGNOSIS — Z95810 Presence of automatic (implantable) cardiac defibrillator: Secondary | ICD-10-CM

## 2017-10-27 DIAGNOSIS — I469 Cardiac arrest, cause unspecified: Secondary | ICD-10-CM

## 2017-10-27 DIAGNOSIS — I4901 Ventricular fibrillation: Secondary | ICD-10-CM

## 2017-10-27 LAB — PULMONARY FUNCTION TEST
DL/VA % PRED: 75 %
DL/VA: 3.31 ml/min/mmHg/L
DLCO COR: 21.08 ml/min/mmHg
DLCO UNC % PRED: 78 %
DLCO cor % pred: 78 %
DLCO unc: 21.03 ml/min/mmHg
FEF 25-75 PRE: 3.95 L/s
FEF2575-%PRED-PRE: 123 %
FEV1-%PRED-PRE: 102 %
FEV1-Pre: 3.11 L
FEV1FVC-%PRED-PRE: 106 %
FEV6-%PRED-PRE: 99 %
FEV6-PRE: 3.61 L
FEV6FVC-%PRED-PRE: 102 %
FVC-%Pred-Pre: 97 %
FVC-Pre: 3.61 L
Pre FEV1/FVC ratio: 86 %
Pre FEV6/FVC Ratio: 100 %

## 2017-10-27 MED ORDER — LOSARTAN POTASSIUM 25 MG PO TABS: 50.0000 mg | ORAL_TABLET | Freq: Every day | ORAL | 3 refills | Status: AC

## 2017-10-27 NOTE — Telephone Encounter (Signed)
Pt needs to know that alcoohol may also be responsible for the liver test abnormalities and it may not be amiodarone  How much is he drinking

## 2017-10-28 ENCOUNTER — Telehealth: Payer: Self-pay | Admitting: Internal Medicine

## 2017-10-28 NOTE — Telephone Encounter (Signed)
New Message    Pt c/o medication issue:  1. Name of Medication: amiodarone (PACERONE) 400 MG tablet  2. How are you currently taking this medication (dosage and times per day)?   3. Are you having a reaction (difficulty breathing--STAT)?   4. What is your medication issue? Pharmacy called to confirm that the patient should only be taking 200mg  vs 400mg . Please call.

## 2017-10-28 NOTE — Telephone Encounter (Signed)
Verified Amiodarone dose to pharmacy as 200 qd, although pt is holding for now due to elevated liver enzymes.

## 2017-10-30 NOTE — Telephone Encounter (Signed)
Spoke with pt who states he never had issues with his liver until starting on amiodarone. I advised him to refrain from alcohol and tylenol until we can trend his liver enzymes to see if it is improving off the amiodarone. Pt had no comment when I asked him to refrain from alcohol.   I will set up a reminder to have pt redraw LFTs at [facility] in a month.

## 2017-11-05 NOTE — Telephone Encounter (Signed)
Noted  

## 2017-11-09 ENCOUNTER — Ambulatory Visit (INDEPENDENT_AMBULATORY_CARE_PROVIDER_SITE_OTHER): Payer: Self-pay | Admitting: *Deleted

## 2017-11-09 ENCOUNTER — Encounter: Payer: Self-pay | Admitting: Physician Assistant

## 2017-11-09 ENCOUNTER — Observation Stay (HOSPITAL_COMMUNITY)
Admit: 2017-11-09 | Discharge: 2017-11-09 | Disposition: A | Discharge status: Home / Self Care | Payer: Self-pay | Source: Intra-hospital | Attending: Internal Medicine | Admitting: Internal Medicine

## 2017-11-09 ENCOUNTER — Telehealth: Payer: Self-pay

## 2017-11-09 ENCOUNTER — Encounter: Payer: Self-pay | Admitting: *Deleted

## 2017-11-09 ENCOUNTER — Encounter (HOSPITAL_COMMUNITY)
Disposition: A | Discharge status: Home / Self Care | Payer: Self-pay | Source: Intra-hospital | Attending: Internal Medicine

## 2017-11-09 DIAGNOSIS — Z91018 Allergy to other foods: Secondary | ICD-10-CM | POA: Insufficient documentation

## 2017-11-09 DIAGNOSIS — F17210 Nicotine dependence, cigarettes, uncomplicated: Secondary | ICD-10-CM | POA: Insufficient documentation

## 2017-11-09 DIAGNOSIS — Z955 Presence of coronary angioplasty implant and graft: Secondary | ICD-10-CM | POA: Insufficient documentation

## 2017-11-09 DIAGNOSIS — Z006 Encounter for examination for normal comparison and control in clinical research program: Secondary | ICD-10-CM | POA: Insufficient documentation

## 2017-11-09 DIAGNOSIS — Z9889 Other specified postprocedural states: Secondary | ICD-10-CM | POA: Insufficient documentation

## 2017-11-09 DIAGNOSIS — Y831 Surgical operation with implant of artificial internal device as the cause of abnormal reaction of the patient, or of later complication, without mention of misadventure at the time of the procedure: Secondary | ICD-10-CM | POA: Insufficient documentation

## 2017-11-09 DIAGNOSIS — Z79899 Other long term (current) drug therapy: Secondary | ICD-10-CM | POA: Insufficient documentation

## 2017-11-09 DIAGNOSIS — T827XXA Infection and inflammatory reaction due to other cardiac and vascular devices, implants and grafts, initial encounter: Principal | ICD-10-CM | POA: Insufficient documentation

## 2017-11-09 DIAGNOSIS — I4901 Ventricular fibrillation: Secondary | ICD-10-CM

## 2017-11-09 DIAGNOSIS — M1990 Unspecified osteoarthritis, unspecified site: Secondary | ICD-10-CM | POA: Insufficient documentation

## 2017-11-09 DIAGNOSIS — Z95810 Presence of automatic (implantable) cardiac defibrillator: Secondary | ICD-10-CM

## 2017-11-09 DIAGNOSIS — I2510 Atherosclerotic heart disease of native coronary artery without angina pectoris: Secondary | ICD-10-CM | POA: Insufficient documentation

## 2017-11-09 DIAGNOSIS — I252 Old myocardial infarction: Secondary | ICD-10-CM | POA: Insufficient documentation

## 2017-11-09 DIAGNOSIS — Z96643 Presence of artificial hip joint, bilateral: Secondary | ICD-10-CM | POA: Insufficient documentation

## 2017-11-09 DIAGNOSIS — I10 Essential (primary) hypertension: Secondary | ICD-10-CM | POA: Insufficient documentation

## 2017-11-09 DIAGNOSIS — I469 Cardiac arrest, cause unspecified: Secondary | ICD-10-CM | POA: Insufficient documentation

## 2017-11-09 DIAGNOSIS — Z7982 Long term (current) use of aspirin: Secondary | ICD-10-CM | POA: Insufficient documentation

## 2017-11-09 HISTORY — PX: ICD GENERATOR REMOVAL: EP1232

## 2017-11-09 SURGERY — ICD GENERATOR REMOVAL
Anesthesia: LOCAL

## 2017-11-09 MED ORDER — SODIUM CHLORIDE 0.9 % IV SOLN: INTRAVENOUS | Status: DC

## 2017-11-09 MED ORDER — GENTAMICIN SULFATE 40 MG/ML IJ SOLN
80.0000 mg | INTRAMUSCULAR | Status: AC
  Administered 2017-11-09: 80 mg

## 2017-11-09 MED ORDER — BUPIVACAINE HCL (PF) 0.25 % IJ SOLN
INTRAMUSCULAR | Status: AC
  Filled 2017-11-09: qty 30

## 2017-11-09 MED ORDER — CIPROFLOXACIN HCL 500 MG PO TABS
500.0000 mg | ORAL_TABLET | Freq: Two times a day (BID) | ORAL | Status: DC
  Administered 2017-11-09: 22:00:00 500 mg via ORAL
  Filled 2017-11-09 (×3): qty 1

## 2017-11-09 MED ORDER — MIDAZOLAM HCL 5 MG/5ML IJ SOLN
INTRAMUSCULAR | Status: AC
  Filled 2017-11-09: qty 5

## 2017-11-09 MED ORDER — DOXYCYCLINE HYCLATE 100 MG PO TABS: 100.0000 mg | ORAL_TABLET | Freq: Two times a day (BID) | ORAL | 0 refills | Status: DC

## 2017-11-09 MED ORDER — MIDAZOLAM HCL 5 MG/5ML IJ SOLN
INTRAMUSCULAR | Status: DC | PRN
  Administered 2017-11-09 (×2): 1 mg via INTRAVENOUS
  Administered 2017-11-09: 2 mg via INTRAVENOUS
  Administered 2017-11-09: 3 mg via INTRAVENOUS
  Administered 2017-11-09 (×5): 1 mg via INTRAVENOUS

## 2017-11-09 MED ORDER — FENTANYL CITRATE (PF) 100 MCG/2ML IJ SOLN
INTRAMUSCULAR | Status: AC
  Filled 2017-11-09: qty 2

## 2017-11-09 MED ORDER — CEFAZOLIN SODIUM-DEXTROSE 2-4 GM/100ML-% IV SOLN
INTRAVENOUS | Status: AC
  Filled 2017-11-09: qty 100

## 2017-11-09 MED ORDER — CHLORHEXIDINE GLUCONATE 4 % EX LIQD: 60.0000 mL | Freq: Once | CUTANEOUS | Status: DC

## 2017-11-09 MED ORDER — CIPROFLOXACIN HCL 500 MG PO TABS: 500.0000 mg | ORAL_TABLET | Freq: Two times a day (BID) | ORAL | 0 refills | Status: DC

## 2017-11-09 MED ORDER — FENTANYL CITRATE (PF) 100 MCG/2ML IJ SOLN
INTRAMUSCULAR | Status: DC | PRN
  Administered 2017-11-09: 25 ug via INTRAVENOUS
  Administered 2017-11-09: 12.5 ug via INTRAVENOUS
  Administered 2017-11-09: 25 ug via INTRAVENOUS
  Administered 2017-11-09 (×2): 50 ug via INTRAVENOUS
  Administered 2017-11-09 (×2): 25 ug via INTRAVENOUS
  Administered 2017-11-09: 50 ug via INTRAVENOUS
  Administered 2017-11-09: 25 ug via INTRAVENOUS

## 2017-11-09 MED ORDER — BUPIVACAINE HCL (PF) 0.25 % IJ SOLN
INTRAMUSCULAR | Status: AC
  Filled 2017-11-09: qty 90

## 2017-11-09 MED ORDER — ONDANSETRON HCL 4 MG/2ML IJ SOLN
INTRAMUSCULAR | Status: DC | PRN
  Administered 2017-11-09: 4 mg via INTRAVENOUS

## 2017-11-09 MED ORDER — SODIUM CHLORIDE 0.9 % IV SOLN
INTRAVENOUS | Status: AC
  Filled 2017-11-09: qty 2

## 2017-11-09 MED ORDER — CEFAZOLIN SODIUM-DEXTROSE 2-4 GM/100ML-% IV SOLN
2.0000 g | INTRAVENOUS | Status: AC
  Administered 2017-11-09: 2 g via INTRAVENOUS

## 2017-11-09 MED ORDER — OXYCODONE-ACETAMINOPHEN 5-325 MG PO TABS: 1.0000 | ORAL_TABLET | ORAL | 0 refills | Status: DC | PRN

## 2017-11-09 MED ORDER — ACETAMINOPHEN 325 MG PO TABS
325.0000 mg | ORAL_TABLET | ORAL | Status: DC | PRN
  Filled 2017-11-09: qty 2

## 2017-11-09 MED ORDER — ONDANSETRON HCL 4 MG/2ML IJ SOLN
INTRAMUSCULAR | Status: AC
  Filled 2017-11-09: qty 2

## 2017-11-09 MED ORDER — DOXYCYCLINE HYCLATE 100 MG PO TABS
100.0000 mg | ORAL_TABLET | Freq: Two times a day (BID) | ORAL | Status: DC
  Administered 2017-11-09: 22:00:00 100 mg via ORAL
  Filled 2017-11-09 (×2): qty 1

## 2017-11-09 MED ORDER — SODIUM CHLORIDE 0.9 % IV SOLN
INTRAVENOUS | Status: AC | PRN
  Administered 2017-11-09: 1000 mL

## 2017-11-09 MED ORDER — ONDANSETRON HCL 4 MG/2ML IJ SOLN: 4.0000 mg | Freq: Four times a day (QID) | INTRAMUSCULAR | Status: DC | PRN

## 2017-11-09 MED ORDER — BUPIVACAINE HCL (PF) 0.25 % IJ SOLN
INTRAMUSCULAR | Status: DC | PRN
  Administered 2017-11-09: 120 mL

## 2017-11-09 SURGICAL SUPPLY — 6 items
CABLE SURGICAL S-101-97-12 (CABLE) ×2 IMPLANT
DEVICE DISSECT PLASMABLAD 3.0S (MISCELLANEOUS) ×1 IMPLANT
DEVICE SECURE STATLOCK IABP (MISCELLANEOUS) IMPLANT
PAD DEFIB LIFELINK (PAD) ×2 IMPLANT
PLASMABLADE 3.0S (MISCELLANEOUS) ×2
TRAY PACEMAKER INSERTION (PACKS) ×2 IMPLANT

## 2017-11-09 NOTE — Discharge Instructions (Signed)
° ° °  Supplemental Discharge Instructions for  Pacemaker/Defibrillator Patients  Activity No heavy lifting or vigorous activity with your left/right arm for 2 weeks  WOUND CARE - Keep the wound area clean and dry.  Keep bandage on. Do not get this area wet for one week. No showers for one week - The tape/steri-strips on your wound will fall off; do not pull them off.  No bandage is needed on the site.  DO  NOT apply any creams, oils, or ointments to the wound area. - If you notice any drainage or discharge from the wound, any swelling or bruising at the site, or you develop a fever > 101? F after you are discharged home, call the office at once..  Our scheduler will contact you to arrange 1 week wound clinic visit.

## 2017-11-09 NOTE — Interval H&P Note (Signed)
History and Physical Interval Note:  11/09/2017 5:02 PM  Tony Reynolds  has presented today for surgery, with the diagnosis of pocket infection  The various methods of treatment have been discussed with the patient and family. After consideration of risks, benefits and other options for treatment, the patient has consented to  Procedure(s): ICD GENERATOR REMOVAL (N/A) as a surgical intervention .  The patient's history has been reviewed, patient examined, no change in status, stable for surgery.  I have reviewed the patient's chart and labs.  Questions were answered to the patient's satisfaction.     Sherryl Manges

## 2017-11-09 NOTE — Discharge Summary (Signed)
Discharge Summary    Patient ID: Tony Reynolds,  MRN: [id number], DOB/AGE: February 09, 1971 47 y.o.  Admit date: 11/09/2017 Discharge date: 11/09/2017  Primary Care Provider: Benita Stabile Primary Cardiologist: Sherryl Manges, MD  Discharge Diagnoses    Principal Problem:   Infection of pacemaker pocket [facility]) Active Problems:   Cardiac arrest [facility])   Allergies Allergies  Allergen Reactions  . Tomato Other (See Comments)    No acidic foods - eczema    Diagnostic Studies/Procedures    ICD device explantation 11/09/2017 _____________   History of Present Illness     Tony Reynolds is a 47 y.o. male with the above past medical history. He underwent S-ICD implantation in March of this year and post op has had persistent pain around the ICD pocket.  He was seen by Dr Graciela Husbands 10/23/17 with no obvious signs of infection. He called the office today with concerns about device drainage. He was added on to the schedule today in the office. Device incision was noted to have erythema with an open area and drainage. He has significant pain around the device and he was sent over for evaluation of device system extraction.  He denies fevers or chills. He denies chest pain, palpitations, dyspnea, PND, orthopnea, nausea, vomiting, dizziness, syncope, edema, weight gain, or early satiety.   Hospital Course      Due to evidence of pacemaker pocket infection, patient underwent device explantation on 11/09/2017 by Dr. Graciela Husbands.  Initially was felt the patient will need to stay until he has a LifeVest given recent history of cardiac arrest.  However patient is adamant he wants to go home today.  Per Dr. Odessa Fleming recommendation, I have prescribed the patient doxycycline 100 mg twice daily for 10 days along with ciprofloxacin 500 mg twice daily for 10 days.  For his discomfort after today's procedure, Dr. Graciela Husbands has approved him to have a one-time prescription of 15 tablets of Percocet which should last him for 4-5  days.  If he need any more pain medication, he will need to discuss with his primary care provider.   During our interview prior to discharge, patient appears to be very drowsy.  I recommended the patient to stay overnight as I do not think he is steady enough to go home at this time.  However patient is absolutely adamant he wished to go home.  We transferred the patient temporarily to [facility] floor for observation in the next few hours.   Upon reassessment, the patient was alert and able to ambulate.  Therefore he was discharged home with plan for close outpatient follow up.  _____________  Discharge Vitals Blood pressure 135/88, pulse (!) 48, temperature 98.9 F (37.2 C), resp. rate 12, SpO2 97 %.  There were no vitals filed for this visit.  Labs & Radiologic Studies    CBC No results for input(s): WBC, NEUTROABS, HGB, HCT, MCV, PLT in the last 72 hours. Basic Metabolic Panel No results for input(s): NA, K, CL, CO2, GLUCOSE, BUN, CREATININE, CALCIUM, MG, PHOS in the last 72 hours. Liver Function Tests No results for input(s): AST, ALT, ALKPHOS, BILITOT, PROT, ALBUMIN in the last 72 hours. No results for input(s): LIPASE, AMYLASE in the last 72 hours. Cardiac Enzymes No results for input(s): CKTOTAL, CKMB, CKMBINDEX, TROPONINI in the last 72 hours. BNP Invalid input(s): POCBNP D-Dimer No results for input(s): DDIMER in the last 72 hours. Hemoglobin A1C No results for input(s): HGBA1C in the last 72 hours. Fasting  Lipid Panel No results for input(s): CHOL, HDL, LDLCALC, TRIG, CHOLHDL, LDLDIRECT in the last 72 hours. Thyroid Function Tests No results for input(s): TSH, T4TOTAL, T3FREE, THYROIDAB in the last 72 hours.  Invalid input(s): FREET3 _____________  No results found. Disposition   Pt is being discharged home today in good condition.  Follow-up Plans & Appointments       Discharge Medications   Allergies as of 11/09/2017      Reactions   Tomato Other (See  Comments)   No acidic foods - eczema      Medication List    TAKE these medications   amiodarone 400 MG tablet Commonly known as:  PACERONE Take 0.5 tablets (200 mg total) by mouth daily.   aspirin 81 MG chewable tablet Chew 1 tablet (81 mg total) by mouth daily.   atorvastatin 20 MG tablet Commonly known as:  LIPITOR Take 1 tablet (20 mg total) by mouth daily at 6 PM.   ciprofloxacin 500 MG tablet Commonly known as:  CIPRO Take 1 tablet (500 mg total) by mouth 2 (two) times daily.   divalproex 250 MG DR tablet Commonly known as:  DEPAKOTE Take 1 tablet (250 mg total) by mouth every 12 (twelve) hours.   doxycycline 100 MG tablet Commonly known as:  VIBRA-TABS Take 1 tablet (100 mg total) by mouth every 12 (twelve) hours.   folic acid 1 MG tablet Commonly known as:  FOLVITE Take 1 tablet (1 mg total) by mouth daily.   losartan 25 MG tablet Commonly known as:  COZAAR Take 2 tablets (50 mg total) by mouth daily.   oxyCODONE-acetaminophen 5-325 MG tablet Commonly known as:  PERCOCET Take 1 tablet by mouth every 4 (four) hours as needed for severe pain.   spironolactone 25 MG tablet Commonly known as:  ALDACTONE Take 0.5 tablets (12.5 mg total) by mouth daily.         Outstanding Labs/Studies   N/A  Duration of Discharge Encounter   Greater than 30 minutes including physician time.  Signed, Azalee Course [place], [place] 11/09/2017, 8:12 PM

## 2017-11-09 NOTE — Progress Notes (Signed)
Mr. Gettler presents today due to pain and drainage from left thorax SICD site noted last night. Mr. Weideman denies fever or chills. Open area mid-incision, edema, erythema towards the bottom of the SICD pocket. It appears that a "head" has formed at the lower portion of the pocket. Mr. Jacox is very guarded due to pain. I have had Dr. Johney Frame evaluate the site and contact Dr. Graciela Husbands. Dr. Graciela Husbands and Gypsy Balsam, NP aware of situation. We have advised the patient to go to Short Stay for device extraction today. He is insistent that he will not be staying the night in-patient. Dr. Johney Frame and I have encouraged him to speak with Dr. Graciela Husbands about the procedure at Short Stay and make a decision at that time. He and his spouse verbalize understanding. Directions given to [facility] entrance at [facility].

## 2017-11-09 NOTE — Telephone Encounter (Signed)
Patient wife called about his ICD draining and having pain below his ICD site. Britta Mccreedy booked him an appointment at [facility]

## 2017-11-09 NOTE — Progress Notes (Signed)
Client received from EP lab via stretcher and only awakens to verbal and tactile stimuli. Hao,PA in and client will go to 6-C-01 via bed for discharge when awake

## 2017-11-09 NOTE — H&P (Addendum)
ELECTROPHYSIOLOGY HISTORY AND PHYSICAL    Patient ID: Tony Reynolds MRN: [id number], DOB/AGE: Feb 09, 1971 48 y.o.  Admit date: 11/09/2017 Date of Consult: 11/09/2017  Primary Physician: Benita Stabile, MD Electrophysiologist: Graciela Husbands  Patient Profile: Tony Reynolds is a 47 y.o. male with a history of hypertension, CAD, arthritis and cardiac arrest s/p S-ICD implant who is being seen today for the evaluation of device pocket pain, swelling and drainage.    HPI:  Tony Reynolds is a 47 y.o. male with the above past medical history. He underwent S-ICD implantation in March of this year and post op has had persistent pain around the ICD pocket.  He was seen by Dr Graciela Husbands 10/23/17 with no obvious signs of infection. He called the office today with concerns about device drainage. He was added on to the schedule today in the office. Device incision was noted to have erythema with an open area and drainage. He has significant pain around the device and he was sent over for evaluation of device system extraction.  He denies fevers or chills. He denies chest pain, palpitations, dyspnea, PND, orthopnea, nausea, vomiting, dizziness, syncope, edema, weight gain, or early satiety.  Past Medical History:  Diagnosis Date  . Arthritis   . Back pain   . Eczema   . Hypertension   . Lumbar disc herniation   . MI (myocardial infarction) (HCC) 2018     Surgical History:  Past Surgical History:  Procedure Laterality Date  . BILATERAL ANTERIOR TOTAL HIP ARTHROPLASTY Bilateral 12/14/2015   Procedure: BILATERAL ANTERIOR TOTAL HIP ARTHROPLASTY;  Surgeon: Kathryne Hitch, MD;  Location: [facility] ORS;  Service: Orthopedics;  Laterality: Bilateral;  . CORONARY/GRAFT ACUTE MI REVASCULARIZATION N/A 05/19/2017   Procedure: Coronary/Graft Acute MI Revascularization;  Surgeon: Tonny Bollman, MD;  Location: [facility] INVASIVE CV LAB;  Service: Cardiovascular;  Laterality: N/A;  . LEFT HEART CATH AND CORONARY ANGIOGRAPHY N/A  05/19/2017   Procedure: LEFT HEART CATH AND CORONARY ANGIOGRAPHY;  Surgeon: Tonny Bollman, MD;  Location: [facility] INVASIVE CV LAB;  Service: Cardiovascular;  Laterality: N/A;  . NO PAST SURGERIES    . SUBQ ICD IMPLANT N/A 07/17/2017   Procedure: SUBQ ICD IMPLANT;  Surgeon: Duke Salvia, MD;  Location: [facility] INVASIVE CV LAB;  Service: Cardiovascular;  Laterality: N/A;     Medications Prior to Admission  Medication Sig Dispense Refill Last Dose  . atorvastatin (LIPITOR) 20 MG tablet Take 1 tablet (20 mg total) by mouth daily at 6 PM. 90 tablet 3 11/08/2017 at Unknown time  . divalproex (DEPAKOTE) 250 MG DR tablet Take 1 tablet (250 mg total) by mouth every 12 (twelve) hours. 60 tablet 0 11/09/2017 at Unknown time  . folic acid (FOLVITE) 1 MG tablet Take 1 tablet (1 mg total) by mouth daily. 30 tablet 0 11/08/2017 at Unknown time  . losartan (COZAAR) 25 MG tablet Take 2 tablets (50 mg total) by mouth daily. 180 tablet 3 11/08/2017 at Unknown time  . spironolactone (ALDACTONE) 25 MG tablet Take 0.5 tablets (12.5 mg total) by mouth daily. 45 tablet 3 11/08/2017 at Unknown time  . amiodarone (PACERONE) 400 MG tablet Take 0.5 tablets (200 mg total) by mouth daily. (Patient not taking: Reported on 11/09/2017) 180 tablet 3 Not Taking  . aspirin 81 MG chewable tablet Chew 1 tablet (81 mg total) by mouth daily. 30 tablet 0 11/06/2017    Inpatient Medications:   Allergies:  Allergies  Allergen Reactions  . Tomato Other (See Comments)  No acidic foods - eczema    Social History   Socioeconomic History  . Marital status: Married    Spouse name: Not on file  . Number of children: Not on file  . Years of education: Not on file  . Highest education level: Not on file  Occupational History  . Not on file  Social Needs  . Financial resource strain: Not on file  . Food insecurity:    Worry: Not on file    Inability: Not on file  . Transportation needs:    Medical: Not on file    Non-medical: Not on file    Tobacco Use  . Smoking status: Current Every Day Smoker    Packs/day: 0.50    Types: Cigarettes    Start date: 04/01/1989  . Smokeless tobacco: Never Used  . Tobacco comment: 10 ciggs per day  Substance and Sexual Activity  . Alcohol use: Yes    Alcohol/week: 0.0 oz    Comment: 14  total  of 12 oz beer weekly  . Drug use: No  . Sexual activity: Not on file  Lifestyle  . Physical activity:    Days per week: Not on file    Minutes per session: Not on file  . Stress: Not on file  Relationships  . Social connections:    Talks on phone: Not on file    Gets together: Not on file    Attends religious service: Not on file    Active member of club or organization: Not on file    Attends meetings of clubs or organizations: Not on file    Relationship status: Not on file  . Intimate partner violence:    Fear of current or ex partner: Not on file    Emotionally abused: Not on file    Physically abused: Not on file    Forced sexual activity: Not on file  Other Topics Concern  . Not on file  Social History Narrative  . Not on file    Family History: no premature CAD   Review of Systems: All other systems reviewed and are otherwise negative except as noted above.  Physical Exam: Vitals:   11/09/17 1634  BP: (!) 143/93  Pulse: (!) 55  Temp: 98.9 F (37.2 C)  SpO2: 99%    GEN- The patient is well appearing, alert and oriented x 3 today.   HEENT: normocephalic, atraumatic; sclera clear, conjunctiva pink; hearing intact; oropharynx clear; neck supple Lungs- Clear to ausculation bilaterally, normal work of breathing.  No wheezes, rales, rhonchi Heart- Regular rate and rhythm, no murmurs, rubs or gallops GI- soft, non-tender, non-distended, bowel sounds present Extremities- no clubbing, cyanosis, or edema; DP/PT/radial pulses 2+ bilaterally MS- no significant deformity or atrophy Skin- warm and dry, no rash or lesion, ICD pocket with opening at median aspect of incision with  drainage; large hematoma noted  Psych- euthymic mood, full affect Neuro- strength and sensation are intact  Labs:   Lab Results  Component Value Date   WBC 5.7 10/23/2017   HGB 14.5 10/23/2017   HCT 40.9 10/23/2017   MCV 84 10/23/2017   PLT 274 10/23/2017   No results for input(s): NA, K, CL, CO2, BUN, CREATININE, CALCIUM, PROT, BILITOT, ALKPHOS, ALT, AST, GLUCOSE in the last 168 hours.  Invalid input(s): LABALBU    Radiology/Studies: No results found.  DEVICE HISTORY: BSX S-ICD implanted 07/2017 for secondary prevention  Assessment/Plan: 1.  Device pocket infection The patient has significant pain at device  site, opening of incision and large hematoma (likely infection).  He had pus draining from pocket last night.  Will require ICD system extraction. Risks, benefits reviewed with patient and wife who wishes to proceed. Will plan for later today  2.  Cardiac arrest Will attempt to arrange LifeVest placement tonight He is adamant he will not stay in hospital.    Signed, Sherryl Manges, NP 11/09/2017 4:58 PM  As above   Tenderness 2 weeks ago without signs of infection, but now clearly so.  Very painful, not febrile   Will need explant.  Very frustrated understandably  Open to Life Vest and reimplantation  [place] with Drs Fawn Kirk and GT

## 2017-11-09 NOTE — Progress Notes (Signed)
Report called and client transferred to 6-C-01 via bed by Arlys John from cath lab

## 2017-11-10 ENCOUNTER — Encounter (HOSPITAL_COMMUNITY): Payer: Self-pay | Admitting: Internal Medicine

## 2017-11-11 ENCOUNTER — Encounter: Payer: Self-pay | Admitting: Internal Medicine

## 2017-11-11 ENCOUNTER — Ambulatory Visit (INDEPENDENT_AMBULATORY_CARE_PROVIDER_SITE_OTHER): Payer: Self-pay | Admitting: Internal Medicine

## 2017-11-11 VITALS — BP 140/86 | HR 46 | Ht 66.0 in | Wt 170.0 lb

## 2017-11-11 DIAGNOSIS — T827XXD Infection and inflammatory reaction due to other cardiac and vascular devices, implants and grafts, subsequent encounter: Secondary | ICD-10-CM

## 2017-11-11 NOTE — Patient Instructions (Signed)
Medication Instructions:  Your physician recommends that you continue on your current medications as directed. Please refer to the Current Medication list given to you today.  Labwork: None  Testing/Procedures: None ordered.  Follow-Up: Your physician recommends that you schedule a follow-up appointment in on Monday July 8 @ 11 am for a Nurse Visit with Dr. Graciela Husbands.   Any Other Special Instructions Will Be Listed Below (If Applicable).     If you need a refill on your cardiac medications before your next appointment, please call your pharmacy.

## 2017-11-11 NOTE — Progress Notes (Signed)
Patient Care Team: Benita Stabile, MD as PCP - General (Internal Medicine) Duke Salvia, MD as PCP - Cardiology (Cardiology)   HPI  Tony Reynolds is a 47 y.o. 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  There was oozing from a small fluid collection that was actually draining through the skin.  This was anesthetized as noted below the pressure released.  The pain mitigated significantly.  Records and Results Reviewed  Past Medical History:  Diagnosis Date  . Arthritis   . Back pain   . Eczema   . Hypertension   . Lumbar disc herniation   . MI (myocardial infarction) (HCC) 2018    Past Surgical History:  Procedure Laterality Date  . BILATERAL ANTERIOR TOTAL HIP ARTHROPLASTY Bilateral 12/14/2015   Procedure: BILATERAL ANTERIOR TOTAL HIP ARTHROPLASTY;  Surgeon: Kathryne Hitch, MD;  Location: [facility];  Service: Orthopedics;  Laterality: Bilateral;  . CORONARY/GRAFT ACUTE MI REVASCULARIZATION N/A 05/19/2017   Procedure: Coronary/Graft Acute MI Revascularization;  Surgeon: Tonny Bollman, MD;  Location: [facility] INVASIVE CV LAB;  Service: Cardiovascular;  Laterality: N/A;  . ICD GENERATOR REMOVAL N/A 11/09/2017   Procedure: ICD GENERATOR REMOVAL;  Surgeon: Duke Salvia, MD;  Location: [facility] INVASIVE CV LAB;  Service: Cardiovascular;  Laterality: N/A;  . LEFT HEART CATH AND CORONARY ANGIOGRAPHY N/A 05/19/2017   Procedure: LEFT HEART CATH AND CORONARY ANGIOGRAPHY;  Surgeon: Tonny Bollman, MD;  Location: [facility] INVASIVE CV LAB;  Service: Cardiovascular;  Laterality: N/A;  . NO PAST SURGERIES    . SUBQ ICD IMPLANT N/A 07/17/2017   Procedure: SUBQ ICD IMPLANT;  Surgeon: Duke Salvia, MD;  Location: [facility] INVASIVE CV LAB;  Service: Cardiovascular;  Laterality: N/A;    Current Meds  Medication Sig  . aspirin 81 MG chewable tablet Chew 1 tablet (81 mg total) by mouth daily.  Marland Kitchen atorvastatin (LIPITOR) 20 MG tablet Take 1 tablet (20 mg  total) by mouth daily at 6 PM.  . ciprofloxacin (CIPRO) 500 MG tablet Take 1 tablet (500 mg total) by mouth 2 (two) times daily.  . divalproex (DEPAKOTE) 250 MG DR tablet Take 1 tablet (250 mg total) by mouth every 12 (twelve) hours.  Marland Kitchen doxycycline (VIBRA-TABS) 100 MG tablet Take 1 tablet (100 mg total) by mouth every 12 (twelve) hours.  Marland Kitchen losartan (COZAAR) 25 MG tablet Take 2 tablets (50 mg total) by mouth daily.  Marland Kitchen oxyCODONE-acetaminophen (PERCOCET) 5-325 MG tablet Take 1 tablet by mouth every 4 (four) hours as needed for severe pain.  Marland Kitchen spironolactone (ALDACTONE) 25 MG tablet Take 0.5 tablets (12.5 mg total) by mouth daily.    Allergies  Allergen Reactions  . Tomato Other (See Comments)    No acidic foods - eczema      Review of Systems negative except from HPI and PMH  Physical Exam BP 140/86   Pulse (!) 46   Ht 5\' 6"  (1.676 m)   Wt 170 lb (77.1 kg)   SpO2 98%   BMI 27.44 kg/m    There was oozing from a small fluid collection just below the pressure dressing.  This was anesthetized and opened with a scalpel with a subsequent drainage of nonpurulent looking sanguinous material.  A pressure dressing was then applied over this most caudal aspect of his pocket with care that allowed for placing of his LifeVest.  He is taking his antibiotics.  Cultures still negative at 48  hours  Assessment and  Plan       Current medicines are reviewed at length with the patient today .  The patient does not have concerns regarding medicines.

## 2017-11-13 ENCOUNTER — Encounter: Payer: Self-pay | Admitting: Internal Medicine

## 2017-11-14 LAB — AEROBIC/ANAEROBIC CULTURE (SURGICAL/DEEP WOUND)

## 2017-11-14 LAB — AEROBIC/ANAEROBIC CULTURE W GRAM STAIN (SURGICAL/DEEP WOUND): Culture: NO GROWTH

## 2017-11-18 ENCOUNTER — Ambulatory Visit: Payer: Self-pay

## 2017-11-23 ENCOUNTER — Ambulatory Visit (INDEPENDENT_AMBULATORY_CARE_PROVIDER_SITE_OTHER): Payer: Self-pay | Admitting: Internal Medicine

## 2017-11-23 VITALS — BP 138/85 | HR 54 | Resp 14 | Ht 66.0 in | Wt 166.6 lb

## 2017-11-23 DIAGNOSIS — Z95810 Presence of automatic (implantable) cardiac defibrillator: Secondary | ICD-10-CM

## 2017-11-23 DIAGNOSIS — T827XXD Infection and inflammatory reaction due to other cardiac and vascular devices, implants and grafts, subsequent encounter: Secondary | ICD-10-CM

## 2017-11-23 LAB — HEPATIC FUNCTION PANEL
ALK PHOS: 117 IU/L (ref 39–117)
ALT: 37 IU/L (ref 0–44)
AST: 33 IU/L (ref 0–40)
Albumin: 4 g/dL (ref 3.5–5.5)
BILIRUBIN TOTAL: 0.3 mg/dL (ref 0.0–1.2)
BILIRUBIN, DIRECT: 0.11 mg/dL (ref 0.00–0.40)
Total Protein: 7.3 g/dL (ref 6.0–8.5)

## 2017-11-23 NOTE — Patient Instructions (Addendum)
Medication Instructions:  Your physician recommends that you continue on your current medications as directed. Please refer to the Current Medication list given to you today.  Labwork: LFT's  Testing/Procedures: None ordered.  Follow-Up: Your physician recommends that you schedule a follow-up appointment in: 2 weeks with Dr Graciela Husbands for a wound check. We will contact your PCP regarding dressing changes.    Any Other Special Instructions Will Be Listed Below (If Applicable).     If you need a refill on your cardiac medications before your next appointment, please call your pharmacy.

## 2017-11-23 NOTE — Progress Notes (Signed)
Pt came in today for redressing of his wound.  Both wounds appear well-healed.  All sutures were removed.  The packing was removed from the sternal incision.  The incision made at the lower lower aspect of the device pocket to relieve the bleeding pressure 10 days ago remains open.  It was packed with about 2 inches of gauze to allow for drainage.

## 2017-11-25 ENCOUNTER — Telehealth: Payer: Self-pay

## 2017-11-25 NOTE — Telephone Encounter (Signed)
Spoke with Tony Reynolds regarding f/up appt with Tony Reynolds in [place] on 7/19 @ 1100 per Dr Graciela Husbands and Dr Ladona Ridgel. I also told Ms Pas that Dr Scharlene Gloss office has agreed to help with dressing changes a couple times per week per Dr Odessa Fleming conversation with him on 7/9. Pt's wife is to call Dr Scharlene Gloss office to arrange appointments for dressing changes.

## 2017-12-04 ENCOUNTER — Ambulatory Visit (INDEPENDENT_AMBULATORY_CARE_PROVIDER_SITE_OTHER): Payer: Self-pay | Admitting: Internal Medicine

## 2017-12-04 ENCOUNTER — Encounter: Payer: Self-pay | Admitting: Internal Medicine

## 2017-12-04 VITALS — BP 140/80 | HR 60 | Ht 66.0 in | Wt 170.4 lb

## 2017-12-04 DIAGNOSIS — T827XXD Infection and inflammatory reaction due to other cardiac and vascular devices, implants and grafts, subsequent encounter: Secondary | ICD-10-CM

## 2017-12-04 NOTE — Progress Notes (Signed)
HPI Mr. Briskey returns today for ongoing evaluation of his ICD incision. He has a h/o VF arrest, s/p resucitation. He underwent insertion of a SQ ICD several months ago which became infected. He has had the device removed and has been on a life vest. His incision has been slow to heal. He is angry about not knowing why he has had a cardiac arrest. He states that he is no longer interested in having an ICD. He understands that he could have another cardiac arrest. He would like to go back to work. His fluid collection has gradually improved.  Allergies  Allergen Reactions  . Tomato Other (See Comments)    No acidic foods - eczema     Current Outpatient Medications  Medication Sig Dispense Refill  . aspirin 81 MG chewable tablet Chew 1 tablet (81 mg total) by mouth daily. 30 tablet 0  . atorvastatin (LIPITOR) 20 MG tablet Take 1 tablet (20 mg total) by mouth daily at 6 PM. 90 tablet 3  . divalproex (DEPAKOTE) 250 MG DR tablet Take 1 tablet (250 mg total) by mouth every 12 (twelve) hours. 60 tablet 0  . losartan (COZAAR) 25 MG tablet Take 2 tablets (50 mg total) by mouth daily. 180 tablet 3  . spironolactone (ALDACTONE) 25 MG tablet Take 0.5 tablets (12.5 mg total) by mouth daily. 45 tablet 3   No current facility-administered medications for this visit.      Past Medical History:  Diagnosis Date  . Arthritis   . Back pain   . Eczema   . Hypertension   . Lumbar disc herniation   . MI (myocardial infarction) (HCC) 2018    ROS:   All systems reviewed and negative except as noted in the HPI.   Past Surgical History:  Procedure Laterality Date  . BILATERAL ANTERIOR TOTAL HIP ARTHROPLASTY Bilateral 12/14/2015   Procedure: BILATERAL ANTERIOR TOTAL HIP ARTHROPLASTY;  Surgeon: Kathryne Hitch, MD;  Location: [facility];  Service: Orthopedics;  Laterality: Bilateral;  . CORONARY/GRAFT ACUTE MI REVASCULARIZATION N/A 05/19/2017   Procedure: Coronary/Graft Acute MI  Revascularization;  Surgeon: Tonny Bollman, MD;  Location: [facility] INVASIVE CV LAB;  Service: Cardiovascular;  Laterality: N/A;  . ICD GENERATOR REMOVAL N/A 11/09/2017   Procedure: ICD GENERATOR REMOVAL;  Surgeon: Duke Salvia, MD;  Location: [facility] INVASIVE CV LAB;  Service: Cardiovascular;  Laterality: N/A;  . LEFT HEART CATH AND CORONARY ANGIOGRAPHY N/A 05/19/2017   Procedure: LEFT HEART CATH AND CORONARY ANGIOGRAPHY;  Surgeon: Tonny Bollman, MD;  Location: [facility] INVASIVE CV LAB;  Service: Cardiovascular;  Laterality: N/A;  . NO PAST SURGERIES    . SUBQ ICD IMPLANT N/A 07/17/2017   Procedure: SUBQ ICD IMPLANT;  Surgeon: Duke Salvia, MD;  Location: [facility] INVASIVE CV LAB;  Service: Cardiovascular;  Laterality: N/A;     No family history on file.   Social History   Socioeconomic History  . Marital status: Married    Spouse name: Not on file  . Number of children: Not on file  . Years of education: Not on file  . Highest education level: Not on file  Occupational History  . Not on file  Social Needs  . Financial resource strain: Not on file  . Food insecurity:    Worry: Not on file    Inability: Not on file  . Transportation needs:    Medical: Not on file    Non-medical: Not on file  Tobacco Use  . Smoking  status: Current Every Day Smoker    Packs/day: 0.50    Types: Cigarettes    Start date: 04/01/1989  . Smokeless tobacco: Never Used  . Tobacco comment: 10 ciggs per day  Substance and Sexual Activity  . Alcohol use: Yes    Alcohol/week: 0.0 oz    Comment: 14  total  of 12 oz beer weekly  . Drug use: No  . Sexual activity: Not on file  Lifestyle  . Physical activity:    Days per week: Not on file    Minutes per session: Not on file  . Stress: Not on file  Relationships  . Social connections:    Talks on phone: Not on file    Gets together: Not on file    Attends religious service: Not on file    Active member of club or organization: Not on file    Attends meetings of  clubs or organizations: Not on file    Relationship status: Not on file  . Intimate partner violence:    Fear of current or ex partner: Not on file    Emotionally abused: Not on file    Physically abused: Not on file    Forced sexual activity: Not on file  Other Topics Concern  . Not on file  Social History Narrative  . Not on file     BP 140/80   Pulse 60   Ht 5\' 6"  (1.676 m)   Wt 170 lb 6.4 oz (77.3 kg)   SpO2 98% Comment: on room air  BMI 27.50 kg/m   Physical Exam:  Well appearing 47 yo man, NAD HEENT: Unremarkable Neck:  No JVD, no thyromegally Lymphatics:  No adenopathy Back:  No CVA tenderness Lungs:  Clear with no wheezes, ICD incision with minimal residual open incision healing by secondary intention. HEART:  Regular rate rhythm, no murmurs, no rubs, no clicks Abd:  soft, positive bowel sounds, no organomegally, no rebound, no guarding Ext:  2 plus pulses, no edema, no cyanosis, no clubbing Skin:  No rashes no nodules Neuro:  CN II through XII intact, motor grossly intact    Assess/Plan: 1. 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 I recommended he wash the area once or twice day. 2. VF arrest - he had severe LV dysfunction which normalized on cardiac MRI. The patient is clear that he is no longer willing to ever consider an ICD. He has taken off his life vest. He will call us if changes his mind.  Tony Reynolds.D.

## 2017-12-04 NOTE — Patient Instructions (Signed)
Medication Instructions:  Your physician recommends that you continue on your current medications as directed. Please refer to the Current Medication list given to you today.   Labwork: NONE   Testing/Procedures: NONE   Follow-Up: Your physician recommends that you schedule a follow-up appointment As Needed    Any Other Special Instructions Will Be Listed Below (If Applicable).     If you need a refill on your cardiac medications before your next appointment, please call your pharmacy.  Thank you for choosing [facility]!   

## 2017-12-08 ENCOUNTER — Encounter: Payer: Self-pay | Admitting: Internal Medicine

## 2017-12-08 ENCOUNTER — Ambulatory Visit (INDEPENDENT_AMBULATORY_CARE_PROVIDER_SITE_OTHER): Payer: Self-pay | Admitting: Internal Medicine

## 2017-12-08 ENCOUNTER — Encounter (INDEPENDENT_AMBULATORY_CARE_PROVIDER_SITE_OTHER): Payer: Self-pay

## 2017-12-08 VITALS — BP 132/86 | HR 97 | Ht 66.0 in | Wt 167.2 lb

## 2017-12-08 DIAGNOSIS — T827XXD Infection and inflammatory reaction due to other cardiac and vascular devices, implants and grafts, subsequent encounter: Secondary | ICD-10-CM

## 2017-12-08 DIAGNOSIS — I4901 Ventricular fibrillation: Secondary | ICD-10-CM

## 2017-12-08 DIAGNOSIS — I469 Cardiac arrest, cause unspecified: Secondary | ICD-10-CM

## 2017-12-08 NOTE — Patient Instructions (Signed)
Medication Instructions:  Your physician recommends that you continue on your current medications as directed. Please refer to the Current Medication list given to you today.  Labwork: None ordered.  Testing/Procedures: None ordered.  Follow-Up:  August 19 at 12:45pm with Dr Graciela Husbands.  Any Other Special Instructions Will Be Listed Below (If Applicable).     If you need a refill on your cardiac medications before your next appointment, please call your pharmacy.

## 2017-12-08 NOTE — Progress Notes (Signed)
Patient Care Team: Benita Stabile, MD as PCP - General (Internal Medicine) Duke Salvia, MD as PCP - Cardiology (Cardiology) Marinus Maw, MD as PCP - Electrophysiology (Cardiology)   HPI  Tony Reynolds is a 47 y.o. male Seen in follow-up for cardiac arrest 1/19.  Catheterization had demonstrated nonobstructive disease.  There was recurrent ventricular fibrillation that was PVC triggered but not pause dependent.  It was not consistent with torsade.  Device implantation was deferred because of numerous skin lesions of unclear cause as well as mental status instability   He has been seen by his primary care physician and orthopedics and was started on antibiotics with marked improvement in his skin lesions.  Echo EF 25% cath 35-40%  He underwent device implantation which was subsequently removed 2/2 Infection -- culture neg  He has a history of alcohol abuse  He would like to return to work.  He has some shortness of breath.  Dyspnea or peripheral edema.  Denies chest pain.  Records and Results Reviewed   Past Medical History:  Diagnosis Date  . Arthritis   . Back pain   . Eczema   . Hypertension   . Lumbar disc herniation   . MI (myocardial infarction) (HCC) 2018    Past Surgical History:  Procedure Laterality Date  . BILATERAL ANTERIOR TOTAL HIP ARTHROPLASTY Bilateral 12/14/2015   Procedure: BILATERAL ANTERIOR TOTAL HIP ARTHROPLASTY;  Surgeon: Kathryne Hitch, MD;  Location: [facility];  Service: Orthopedics;  Laterality: Bilateral;  . CORONARY/GRAFT ACUTE MI REVASCULARIZATION N/A 05/19/2017   Procedure: Coronary/Graft Acute MI Revascularization;  Surgeon: Tonny Bollman, MD;  Location: [facility] INVASIVE CV LAB;  Service: Cardiovascular;  Laterality: N/A;  . ICD GENERATOR REMOVAL N/A 11/09/2017   Procedure: ICD GENERATOR REMOVAL;  Surgeon: Duke Salvia, MD;  Location: [facility] INVASIVE CV LAB;  Service: Cardiovascular;  Laterality: N/A;  . LEFT HEART CATH AND  CORONARY ANGIOGRAPHY N/A 05/19/2017   Procedure: LEFT HEART CATH AND CORONARY ANGIOGRAPHY;  Surgeon: Tonny Bollman, MD;  Location: [facility] INVASIVE CV LAB;  Service: Cardiovascular;  Laterality: N/A;  . NO PAST SURGERIES    . SUBQ ICD IMPLANT N/A 07/17/2017   Procedure: SUBQ ICD IMPLANT;  Surgeon: Duke Salvia, MD;  Location: [facility] INVASIVE CV LAB;  Service: Cardiovascular;  Laterality: N/A;    Current Outpatient Medications  Medication Sig Dispense Refill  . aspirin 81 MG chewable tablet Chew 1 tablet (81 mg total) by mouth daily. 30 tablet 0  . atorvastatin (LIPITOR) 20 MG tablet Take 1 tablet (20 mg total) by mouth daily at 6 PM. 90 tablet 3  . divalproex (DEPAKOTE) 250 MG DR tablet Take 1 tablet (250 mg total) by mouth every 12 (twelve) hours. 60 tablet 0  . losartan (COZAAR) 25 MG tablet Take 2 tablets (50 mg total) by mouth daily. 180 tablet 3  . spironolactone (ALDACTONE) 25 MG tablet Take 0.5 tablets (12.5 mg total) by mouth daily. 45 tablet 3   No current facility-administered medications for this visit.     Allergies  Allergen Reactions  . Tomato Other (See Comments)    No acidic foods - eczema      Review of Systems negative except from HPI and PMH  Physical Exam BP 132/86   Pulse 97   Ht 5\' 6"  (1.676 m)   Wt 167 lb 3.2 oz (75.8 kg)   SpO2 99%   BMI 26.99 kg/m  Well developed and well nourished in  no acute distress HENT normal E scleral and icterus clear Neck Supple JVP flat; carotids brisk and full Clear to ausculation Regular rate and rhythm, no murmurs gallops or rub Soft with active bowel sounds No clubbing cyanosis  Edema Alert and oriented, grossly normal motor and sensory function Skin Warm and Dry the wound exit site at inferior edge of icd explant site     Assessment and  Plan  NICM  Cardiac Arrest   Sinus bradycardia  Hypertension  Abnormal ECG  LifeVest in place  He is reinclined to consider ICD  But for now not sure and he will not wear  the LifeVest  We will revisit his wound in about 4 weeks and then make a decision regarding ICD reimplantation   Current medicines are reviewed at length with the patient today .  The patient does not also 2-week have concerns regarding medicines.

## 2018-01-04 ENCOUNTER — Ambulatory Visit (INDEPENDENT_AMBULATORY_CARE_PROVIDER_SITE_OTHER): Payer: Self-pay | Admitting: Internal Medicine

## 2018-01-04 ENCOUNTER — Encounter: Payer: Self-pay | Admitting: Internal Medicine

## 2018-01-04 VITALS — BP 110/64 | HR 51 | Ht 66.0 in | Wt 169.4 lb

## 2018-01-04 DIAGNOSIS — I469 Cardiac arrest, cause unspecified: Secondary | ICD-10-CM

## 2018-01-04 DIAGNOSIS — I4901 Ventricular fibrillation: Secondary | ICD-10-CM

## 2018-01-04 DIAGNOSIS — T827XXD Infection and inflammatory reaction due to other cardiac and vascular devices, implants and grafts, subsequent encounter: Secondary | ICD-10-CM

## 2018-01-04 NOTE — Progress Notes (Signed)
Patient Care Team: Benita Stabile, MD as PCP - General (Internal Medicine) Duke Salvia, MD as PCP - Cardiology (Cardiology) Marinus Maw, MD as PCP - Electrophysiology (Cardiology)   HPI  Tony Reynolds is a 47 y.o. male Seen in follow-up for cardiac arrest 1/19.  Catheterization had demonstrated nonobstructive disease.  There was recurrent ventricular fibrillation that was PVC triggered but not pause dependent.  It was not consistent with torsade.  Device implantation was deferred because of numerous skin lesions of unclear cause as well as mental status instability   He has been seen by his primary care physician and orthopedics and was started on antibiotics with marked improvement in his skin lesions.  Echo EF 25% cath 35-40%  He underwent device implantation which was subsequently removed 2/2 Infection -- culture neg   He is interested in device reimplantation   Some tenderness at the posterior aspect of the pocket site Records and Results Reviewed   Past Medical History:  Diagnosis Date  . Arthritis   . Back pain   . Eczema   . Hypertension   . Lumbar disc herniation   . MI (myocardial infarction) (HCC) 2018    Past Surgical History:  Procedure Laterality Date  . BILATERAL ANTERIOR TOTAL HIP ARTHROPLASTY Bilateral 12/14/2015   Procedure: BILATERAL ANTERIOR TOTAL HIP ARTHROPLASTY;  Surgeon: Kathryne Hitch, MD;  Location: [facility];  Service: Orthopedics;  Laterality: Bilateral;  . CORONARY/GRAFT ACUTE MI REVASCULARIZATION N/A 05/19/2017   Procedure: Coronary/Graft Acute MI Revascularization;  Surgeon: Tonny Bollman, MD;  Location: [facility] INVASIVE CV LAB;  Service: Cardiovascular;  Laterality: N/A;  . ICD GENERATOR REMOVAL N/A 11/09/2017   Procedure: ICD GENERATOR REMOVAL;  Surgeon: Duke Salvia, MD;  Location: [facility] INVASIVE CV LAB;  Service: Cardiovascular;  Laterality: N/A;  . LEFT HEART CATH AND CORONARY ANGIOGRAPHY N/A 05/19/2017   Procedure: LEFT HEART  CATH AND CORONARY ANGIOGRAPHY;  Surgeon: Tonny Bollman, MD;  Location: [facility] INVASIVE CV LAB;  Service: Cardiovascular;  Laterality: N/A;  . NO PAST SURGERIES    . SUBQ ICD IMPLANT N/A 07/17/2017   Procedure: SUBQ ICD IMPLANT;  Surgeon: Duke Salvia, MD;  Location: [facility] INVASIVE CV LAB;  Service: Cardiovascular;  Laterality: N/A;    Current Outpatient Medications  Medication Sig Dispense Refill  . aspirin 81 MG chewable tablet Chew 1 tablet (81 mg total) by mouth daily. 30 tablet 0  . atorvastatin (LIPITOR) 20 MG tablet Take 1 tablet (20 mg total) by mouth daily at 6 PM. 90 tablet 3  . divalproex (DEPAKOTE) 250 MG DR tablet Take 1 tablet (250 mg total) by mouth every 12 (twelve) hours. 60 tablet 0  . losartan (COZAAR) 25 MG tablet Take 2 tablets (50 mg total) by mouth daily. 180 tablet 3  . spironolactone (ALDACTONE) 25 MG tablet Take 0.5 tablets (12.5 mg total) by mouth daily. 45 tablet 3   No current facility-administered medications for this visit.     Allergies  Allergen Reactions  . Tomato Other (See Comments)    No acidic foods - eczema      Review of Systems negative except from HPI and PMH  Physical Exam BP 110/64   Pulse (!) 51   Ht 5\' 6"  (1.676 m)   Wt 169 lb 6.4 oz (76.8 kg)   SpO2 98%   BMI 27.34 kg/m  Well developed and nourished in no acute distress HENT normal Neck supple with JVP-flat Carotids brisk and full  without bruits Clear Open wound at lower aspect of ICD pocket With  Regular rate and rhythm, no murmurs or gallops Abd-soft with active BS without hepatomegaly No Clubbing cyanosis edema Skin-warm and dry A & Oriented  Grossly normal sensory and motor function    Assessment and  Plan  NICM  Cardiac Arrest   Sinus bradycardia  Hypertension  ED  Abnormal ECG  LifeVest not wearing   his wound has still not healed completely   Will check with DR GT   He is interested in reimplant which would be done transvenous  For now warm  compresses  ED is a major concern, both the losartan and the spiro can be assoc with it so will sequentially remove with the hope that we can clarify culprit  In the interim we will also hold his statin  Depakote assoc w 1/200 so will hold prior to rec on this and then have him get up with Dr Margo Aye  We spent more than 50% of our >25 min visit in face to face counseling regarding the above

## 2018-01-04 NOTE — Patient Instructions (Signed)
Medication Instructions:  Your physician has recommended you make the following change in your medication:   1. Please hold your Cozaar and Aldactone for the next 3 weeks. Please call us to let us know if your symptoms have been relieved.   Labwork: None ordered.  Testing/Procedures: None ordered.  Follow-Up: Your physician recommends that you schedule a follow-up appointment after Dr Graciela Husbands speaks with Dr Ladona Ridgel regarding implantation.   Any Other Special Instructions Will Be Listed Below (If Applicable).     If you need a refill on your cardiac medications before your next appointment, please call your pharmacy.

## 2018-01-20 ENCOUNTER — Telehealth: Payer: Self-pay | Admitting: Cardiology

## 2018-01-20 NOTE — Telephone Encounter (Signed)
Numerous attempts to contact patient with recall letters. Unable to reach by telephone. with no success.   

## 2018-01-22 ENCOUNTER — Encounter: Payer: Self-pay | Admitting: Physician Assistant

## 2018-04-01 ENCOUNTER — Telehealth: Payer: Self-pay | Admitting: Internal Medicine

## 2018-04-01 NOTE — Telephone Encounter (Signed)
° ° °  Patient calling requesting a procedure be scheduled. Please call Last office visit states:Follow-Up: Your physician recommends that you schedule a follow-up appointment after Dr Graciela Husbands speaks with Dr Ladona Ridgel regarding implantation.

## 2018-04-01 NOTE — Telephone Encounter (Signed)
Spoke with pt who is interested in being reimplanted. He states his SqICD site continues to have some slight drainage. I advised him I would discuss with Dr Graciela Husbands and have someone call to arrange a time for an OV to discuss re implantation. Pt verbalized understanding.

## 2018-04-01 NOTE — Telephone Encounter (Signed)
LVM for return call. 

## 2018-04-05 ENCOUNTER — Telehealth: Payer: Self-pay | Admitting: Internal Medicine

## 2018-04-05 NOTE — Telephone Encounter (Signed)
Spoke with pt. He states that he spoke with Dr Odessa Fleming nurse on 11/14 regarding the ICD reimplantation. Pt states that Dr. Odessa Fleming and nurse would  discuss this issue and that someone will call him back with an OV, but no one has called him back. Pt is aware that I will send this message to MD for recommendations.

## 2018-04-05 NOTE — Telephone Encounter (Signed)
Pt called [place] office and left a voice message wanting to know when he's going to be scheduled for his surgery.

## 2018-04-07 ENCOUNTER — Other Ambulatory Visit: Payer: Self-pay

## 2018-04-07 ENCOUNTER — Emergency Department (HOSPITAL_COMMUNITY)
Admission: EM | Admit: 2018-04-07 | Discharge: 2018-04-07 | Disposition: A | Discharge status: Home / Self Care | Payer: Self-pay | Attending: Emergency Medicine | Admitting: Emergency Medicine

## 2018-04-07 ENCOUNTER — Encounter (HOSPITAL_COMMUNITY): Payer: Self-pay

## 2018-04-07 DIAGNOSIS — F17210 Nicotine dependence, cigarettes, uncomplicated: Secondary | ICD-10-CM | POA: Insufficient documentation

## 2018-04-07 DIAGNOSIS — Z79899 Other long term (current) drug therapy: Secondary | ICD-10-CM | POA: Insufficient documentation

## 2018-04-07 DIAGNOSIS — Z7982 Long term (current) use of aspirin: Secondary | ICD-10-CM | POA: Insufficient documentation

## 2018-04-07 DIAGNOSIS — I10 Essential (primary) hypertension: Secondary | ICD-10-CM | POA: Insufficient documentation

## 2018-04-07 DIAGNOSIS — N529 Male erectile dysfunction, unspecified: Secondary | ICD-10-CM | POA: Insufficient documentation

## 2018-04-07 NOTE — ED Triage Notes (Signed)
Pt states he is having male problems. Says he is having urinary symptoms, but is not giving more information.

## 2018-04-07 NOTE — Discharge Instructions (Addendum)
Please call [facility] and set up an appointment with Dr Retta Diones, or a member of his team.

## 2018-04-07 NOTE — ED Notes (Signed)
ED Provider at bedside. 

## 2018-04-07 NOTE — ED Provider Notes (Signed)
[facility] EMERGENCY DEPARTMENT Provider Note   CSN: [id number] Arrival date & time: 04/07/18  1038     History   Chief Complaint Chief Complaint  Patient presents with  . Erectile Dysfunction    HPI Tony Reynolds is a 47 y.o. male.  Pt reports problem with obtaining and maintaining an erection. Pt has changed blood pressure medications, but this has not helped.   Erectile Dysfunction  The current episode started more than 1 week ago. The problem occurs daily. The problem has been gradually worsening. Pertinent negatives include no chest pain, no abdominal pain, no headaches and no shortness of breath. Nothing aggravates the symptoms. Nothing relieves the symptoms. He has tried nothing for the symptoms.    Past Medical History:  Diagnosis Date  . Arthritis   . Back pain   . Eczema   . Hypertension   . Lumbar disc herniation   . MI (myocardial infarction) (HCC) 2018    Patient Active Problem List   Diagnosis Date Noted  . 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 Dysphagia   . ETOH abuse   . Coronary artery disease involving native coronary artery of native heart without angina pectoris   . Tobacco abuse   . Benign essential HTN   . Agitation   . Hypokalemia   . Cardiac arrest (HCC) 05/19/2017  . 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 BILATERAL ANTERIOR TOTAL HIP ARTHROPLASTY Bilateral 12/14/2015   Procedure: BILATERAL ANTERIOR TOTAL HIP ARTHROPLASTY;  Surgeon: Kathryne Hitch, MD;  Location: [facility];  Service: Orthopedics;  Laterality: Bilateral;  . CORONARY/GRAFT ACUTE MI REVASCULARIZATION N/A 05/19/2017   Procedure: Coronary/Graft Acute MI Revascularization;  Surgeon: Tonny Bollman,  MD;  Location: [facility] INVASIVE CV LAB;  Service: Cardiovascular;  Laterality: N/A;  . ICD GENERATOR REMOVAL N/A 11/09/2017   Procedure: ICD GENERATOR REMOVAL;  Surgeon: Duke Salvia, MD;  Location: [facility] INVASIVE CV LAB;  Service: Cardiovascular;  Laterality: N/A;  . LEFT HEART CATH AND CORONARY ANGIOGRAPHY N/A 05/19/2017   Procedure: LEFT HEART CATH AND CORONARY ANGIOGRAPHY;  Surgeon: Tonny Bollman, MD;  Location: [facility] INVASIVE CV LAB;  Service: Cardiovascular;  Laterality: N/A;  . NO PAST SURGERIES    . SUBQ ICD IMPLANT N/A 07/17/2017   Procedure: SUBQ ICD IMPLANT;  Surgeon: Duke Salvia, MD;  Location: [facility] INVASIVE CV LAB;  Service: Cardiovascular;  Laterality: N/A;        Home Medications    Prior to Admission medications   Medication Sig Start Date End Date Taking? Authorizing Provider  aspirin 81 MG chewable tablet Chew 1 tablet (81 mg total) by mouth daily. 05/29/17  Yes Briant Cedar, MD  atorvastatin (LIPITOR) 20 MG tablet Take 1 tablet (20 mg total) by mouth daily at 6 PM. 06/15/17  Yes Duke Salvia, MD  divalproex (DEPAKOTE) 250 MG DR tablet Take 1 tablet (250 mg total) by mouth every 12 (twelve) hours. 05/28/17  Yes Briant Cedar, MD  losartan (COZAAR) 25 MG tablet Take 2 tablets (50 mg total) by mouth daily. 10/27/17  Yes Duke Salvia, MD  sildenafil (VIAGRA) 100 MG tablet TAKE ONE TABLET BY MOUTH 1 HOUR BEFORE INTERCOURSE 01/22/18  Yes [provider]  tamsulosin (FLOMAX) 0.4 MG CAPS  capsule TAKE 1 CAPSULE BY MOUTH NIGHTLY FOR URINE 03/01/18  Yes [provider]    Family History No family history on file.  Social History Social History   Tobacco Use  . Smoking status: Current Every Day Smoker    Packs/day: 0.50    Types: Cigarettes    Start date: 04/01/1989  . Smokeless tobacco: Never Used  . Tobacco comment: 10 ciggs per day  Substance Use Topics  . Alcohol use: Not Currently    Alcohol/week: 0.0 standard drinks    Frequency: Never     Comment: Quit drinking in MAY  . Drug use: No     Allergies   Tomato   Review of Systems Review of Systems  Constitutional: Negative for activity change.       All ROS Neg except as noted in HPI  HENT: Negative for nosebleeds.   Eyes: Negative for photophobia and discharge.  Respiratory: Negative for cough, shortness of breath and wheezing.   Cardiovascular: Negative for chest pain and palpitations.  Gastrointestinal: Negative for abdominal pain and blood in stool.  Genitourinary: Negative for decreased urine volume, difficulty urinating, discharge, dysuria, flank pain, frequency, hematuria, penile pain, penile swelling, scrotal swelling, testicular pain and urgency.  Musculoskeletal: Negative for arthralgias, back pain and neck pain.  Skin: Negative.   Neurological: Negative for dizziness, seizures, speech difficulty and headaches.  Psychiatric/Behavioral: Negative for confusion and hallucinations.     Physical Exam Updated Vital Signs BP (!) 167/100   Pulse 77   Temp 98.2 F (36.8 C)   Ht 5\' 6"  (1.676 m)   Wt 77.1 kg   SpO2 98%   BMI 27.44 kg/m   Physical Exam  Constitutional: He is oriented to person, place, and time. He appears well-developed and well-nourished.  Non-toxic appearance.  HENT:  Head: Normocephalic.  Right Ear: Tympanic membrane and external ear normal.  Left Ear: Tympanic membrane and external ear normal.  Eyes: Pupils are equal, round, and reactive to light. EOM and lids are normal.  Neck: Normal range of motion. Neck supple. Carotid bruit is not present.  Cardiovascular: Normal rate, regular rhythm, normal heart sounds, intact distal pulses and normal pulses.  Pulmonary/Chest: Breath sounds normal. No respiratory distress.  Abdominal: Soft. Bowel sounds are normal. There is no tenderness. There is no guarding. Hernia confirmed negative in the right inguinal area and confirmed negative in the left inguinal area.  Genitourinary: Testes normal and  penis normal. Uncircumcised. No phimosis, paraphimosis or penile tenderness. No discharge found.  Musculoskeletal: Normal range of motion.  Lymphadenopathy:       Head (right side): No submandibular adenopathy present.       Head (left side): No submandibular adenopathy present.    He has no cervical adenopathy.  Neurological: He is alert and oriented to person, place, and time. He has normal strength. No cranial nerve deficit or sensory deficit.  Skin: Skin is warm and dry.  Psychiatric: He has a normal mood and affect. His speech is normal.  Nursing note and vitals reviewed.    ED Treatments / Results  Labs (all labs ordered are listed, but only abnormal results are displayed) Labs Reviewed - No data to display  EKG None  Radiology No results found.  Procedures Procedures (including critical care time)  Medications Ordered in ED Medications - No data to display   Initial Impression / Assessment and Plan / ED Course  I have reviewed the triage vital signs and the nursing notes.  Pertinent  labs & imaging results that were available during my care of the patient were reviewed by me and considered in my medical decision making (see chart for details).       Final Clinical Impressions(s) / ED Diagnoses MDM  Pt has been experiencing ED for several weeks. Changes in medication not effective. No recent surgery. Pt has MI several months ago. Pt referred to urology for consult. Pt reassured that no acute changes of the exam found today.   Final diagnoses:  Erectile dysfunction, unspecified erectile dysfunction type    ED Discharge Orders    None       Ivery Quale, PA-C 04/08/18 2124    Bethann Berkshire, MD 04/10/18 1523

## 2018-04-07 NOTE — ED Notes (Signed)
Pt very guarded regarding pt's complaint.  Pt would not get into gown while this RN is in the room nor would discuss issue with this RN

## 2018-04-08 NOTE — Telephone Encounter (Signed)
Spoke with scheduler who will address today.

## 2018-04-29 ENCOUNTER — Ambulatory Visit (INDEPENDENT_AMBULATORY_CARE_PROVIDER_SITE_OTHER): Payer: Self-pay | Admitting: Internal Medicine

## 2018-04-29 ENCOUNTER — Other Ambulatory Visit (HOSPITAL_COMMUNITY)
Admission: RE | Admit: 2018-04-29 | Discharge: 2018-04-29 | Disposition: A | Discharge status: Home / Self Care | Payer: Self-pay | Source: Ambulatory Visit | Attending: Internal Medicine | Admitting: Internal Medicine

## 2018-04-29 ENCOUNTER — Encounter: Payer: Self-pay | Admitting: *Deleted

## 2018-04-29 ENCOUNTER — Encounter: Payer: Self-pay | Admitting: Internal Medicine

## 2018-04-29 VITALS — BP 128/74 | HR 65 | Ht 66.0 in | Wt 178.0 lb

## 2018-04-29 DIAGNOSIS — T827XXD Infection and inflammatory reaction due to other cardiac and vascular devices, implants and grafts, subsequent encounter: Secondary | ICD-10-CM | POA: Insufficient documentation

## 2018-04-29 LAB — BASIC METABOLIC PANEL
ANION GAP: 7 (ref 5–15)
BUN: 10 mg/dL (ref 6–20)
CALCIUM: 8.9 mg/dL (ref 8.9–10.3)
CO2: 27 mmol/L (ref 22–32)
Chloride: 104 mmol/L (ref 98–111)
Creatinine, Ser: 1.05 mg/dL (ref 0.61–1.24)
GFR calc Af Amer: >60 mL/min (ref >60)
Glucose, Bld: 90 mg/dL (ref 70–99)
Potassium: 3.6 mmol/L (ref 3.5–5.1)
SODIUM: 138 mmol/L (ref 135–145)

## 2018-04-29 NOTE — Addendum Note (Signed)
Addended by: Kerney Elbe on: 04/29/2018 11:06 AM   Modules accepted: Orders

## 2018-04-29 NOTE — H&P (View-Only) (Signed)
HPI [name] returns today for followup. He is a pleasant 47 yo man with a h/o VF arrest. He underwent removal of his S-ICD several months ago. He has not had any fever but is still sore with his incision. He has had some drainage. He would like to have a new ICD insertion. Allergies  Allergen Reactions  . Tomato Other (See Comments)    No acidic foods - eczema     Current Outpatient Medications  Medication Sig Dispense Refill  . aspirin 81 MG chewable tablet Chew 1 tablet (81 mg total) by mouth daily. 30 tablet 0  . atorvastatin (LIPITOR) 20 MG tablet Take 1 tablet (20 mg total) by mouth daily at 6 PM. 90 tablet 3  . divalproex (DEPAKOTE) 250 MG DR tablet Take 1 tablet (250 mg total) by mouth every 12 (twelve) hours. 60 tablet 0  . losartan (COZAAR) 25 MG tablet Take 2 tablets (50 mg total) by mouth daily. 180 tablet 3  . sildenafil (VIAGRA) 100 MG tablet TAKE ONE TABLET BY MOUTH 1 HOUR BEFORE INTERCOURSE  0  . tamsulosin (FLOMAX) 0.4 MG CAPS capsule TAKE 1 CAPSULE BY MOUTH NIGHTLY FOR URINE  3   No current facility-administered medications for this visit.      Past Medical History:  Diagnosis Date  . Arthritis   . Back pain   . Eczema   . Hypertension   . Lumbar disc herniation   . MI (myocardial infarction) (HCC) 2018    ROS:   All systems reviewed and negative except as noted in the HPI.   Past Surgical History:  Procedure Laterality Date  . BILATERAL ANTERIOR TOTAL HIP ARTHROPLASTY Bilateral 12/14/2015   Procedure: BILATERAL ANTERIOR TOTAL HIP ARTHROPLASTY;  Surgeon: Tony Hitch, MD;  Location: [facility];  Service: Orthopedics;  Laterality: Bilateral;  . CORONARY/GRAFT ACUTE MI REVASCULARIZATION N/A 05/19/2017   Procedure: Coronary/Graft Acute MI Revascularization;  Surgeon: Tony Bollman, MD;  Location: [facility] INVASIVE CV LAB;  Service: Cardiovascular;  Laterality: N/A;  . ICD GENERATOR REMOVAL N/A 11/09/2017   Procedure: ICD GENERATOR REMOVAL;  Surgeon:  Tony Salvia, MD;  Location: [facility] INVASIVE CV LAB;  Service: Cardiovascular;  Laterality: N/A;  . LEFT HEART CATH AND CORONARY ANGIOGRAPHY N/A 05/19/2017   Procedure: LEFT HEART CATH AND CORONARY ANGIOGRAPHY;  Surgeon: Tony Bollman, MD;  Location: [facility] INVASIVE CV LAB;  Service: Cardiovascular;  Laterality: N/A;  . NO PAST SURGERIES    . SUBQ ICD IMPLANT N/A 07/17/2017   Procedure: SUBQ ICD IMPLANT;  Surgeon: Tony Salvia, MD;  Location: [facility] INVASIVE CV LAB;  Service: Cardiovascular;  Laterality: N/A;     History reviewed. No pertinent family history.   Social History   Socioeconomic History  . Marital status: Married    Spouse name: Not on file  . Number of children: Not on file  . Years of education: Not on file  . Highest education level: Not on file  Occupational History  . Not on file  Social Needs  . Financial resource strain: Not on file  . Food insecurity:    Worry: Not on file    Inability: Not on file  . Transportation needs:    Medical: Not on file    Non-medical: Not on file  Tobacco Use  . Smoking status: Current Every Day Smoker    Packs/day: 0.50    Types: Cigarettes    Start date: 04/01/1989  . Smokeless tobacco: Never Used  .  Tobacco comment: 10 ciggs per day  Substance and Sexual Activity  . Alcohol use: Not Currently    Alcohol/week: 0.0 standard drinks    Frequency: Never    Comment: Quit drinking in MAY  . Drug use: No  . Sexual activity: Not on file  Lifestyle  . Physical activity:    Days per week: Not on file    Minutes per session: Not on file  . Stress: Not on file  Relationships  . Social connections:    Talks on phone: Not on file    Gets together: Not on file    Attends religious service: Not on file    Active member of club or organization: Not on file    Attends meetings of clubs or organizations: Not on file    Relationship status: Not on file  . Intimate partner violence:    Fear of current or ex partner: Not on file     Emotionally abused: Not on file    Physically abused: Not on file    Forced sexual activity: Not on file  Other Topics Concern  . Not on file  Social History Narrative  . Not on file     BP 128/74 (BP Location: Left Arm)   Pulse 65   Ht 5\' 6"  (1.676 m)   Wt 178 lb (80.7 kg)   SpO2 99%   BMI 28.73 kg/m   Physical Exam:  Well appearing NAD HEENT: Unremarkable Neck:  No JVD, no thyromegally Lymphatics:  No adenopathy Back:  No CVA tenderness Lungs:  Clear with no wheezes HEART:  Regular rate rhythm, no murmurs, no rubs, no clicks Abd:  soft, positive bowel sounds, no organomegally, no rebound, no guarding Ext:  2 plus pulses, no edema, no cyanosis, no clubbing Skin:  No rashes no nodules Neuro:  CN II through XII intact, motor grossly intact  EKG - none  DEVICE  Normal device function.  See PaceArt for details.   Assess/Plan: 1. ICD pocket infection - I tried to probe his pocket but it was very painful. He will need to have his incision revised.  We will schedule this tomorrow. 2. VF - he has had no recurrent symptoms. He has his life vest at home but is not wearing it.  Leonia Reeves.D.

## 2018-04-29 NOTE — Progress Notes (Signed)
    HPI [name] returns today for followup. He is a pleasant 47 yo man with a h/o VF arrest. He underwent removal of his S-ICD several months ago. He has not had any fever but is still sore with his incision. He has had some drainage. He would like to have a new ICD insertion. Allergies  Allergen Reactions  . Tomato Other (See Comments)    No acidic foods - eczema     Current Outpatient Medications  Medication Sig Dispense Refill  . aspirin 81 MG chewable tablet Chew 1 tablet (81 mg total) by mouth daily. 30 tablet 0  . atorvastatin (LIPITOR) 20 MG tablet Take 1 tablet (20 mg total) by mouth daily at 6 PM. 90 tablet 3  . divalproex (DEPAKOTE) 250 MG DR tablet Take 1 tablet (250 mg total) by mouth every 12 (twelve) hours. 60 tablet 0  . losartan (COZAAR) 25 MG tablet Take 2 tablets (50 mg total) by mouth daily. 180 tablet 3  . sildenafil (VIAGRA) 100 MG tablet TAKE ONE TABLET BY MOUTH 1 HOUR BEFORE INTERCOURSE  0  . tamsulosin (FLOMAX) 0.4 MG CAPS capsule TAKE 1 CAPSULE BY MOUTH NIGHTLY FOR URINE  3   No current facility-administered medications for this visit.      Past Medical History:  Diagnosis Date  . Arthritis   . Back pain   . Eczema   . Hypertension   . Lumbar disc herniation   . MI (myocardial infarction) (HCC) 2018    ROS:   All systems reviewed and negative except as noted in the HPI.   Past Surgical History:  Procedure Laterality Date  . BILATERAL ANTERIOR TOTAL HIP ARTHROPLASTY Bilateral 12/14/2015   Procedure: BILATERAL ANTERIOR TOTAL HIP ARTHROPLASTY;  Surgeon: Christopher Y Blackman, MD;  Location: [facility];  Service: Orthopedics;  Laterality: Bilateral;  . CORONARY/GRAFT ACUTE MI REVASCULARIZATION N/A 05/19/2017   Procedure: Coronary/Graft Acute MI Revascularization;  Surgeon: Cooper, Michael, MD;  Location: [facility] INVASIVE CV LAB;  Service: Cardiovascular;  Laterality: N/A;  . ICD GENERATOR REMOVAL N/A 11/09/2017   Procedure: ICD GENERATOR REMOVAL;  Surgeon:  Klein, Steven C, MD;  Location: [facility] INVASIVE CV LAB;  Service: Cardiovascular;  Laterality: N/A;  . LEFT HEART CATH AND CORONARY ANGIOGRAPHY N/A 05/19/2017   Procedure: LEFT HEART CATH AND CORONARY ANGIOGRAPHY;  Surgeon: Cooper, Michael, MD;  Location: [facility] INVASIVE CV LAB;  Service: Cardiovascular;  Laterality: N/A;  . NO PAST SURGERIES    . SUBQ ICD IMPLANT N/A 07/17/2017   Procedure: SUBQ ICD IMPLANT;  Surgeon: Klein, Steven C, MD;  Location: [facility] INVASIVE CV LAB;  Service: Cardiovascular;  Laterality: N/A;     History reviewed. No pertinent family history.   Social History   Socioeconomic History  . Marital status: Married    Spouse name: Not on file  . Number of children: Not on file  . Years of education: Not on file  . Highest education level: Not on file  Occupational History  . Not on file  Social Needs  . Financial resource strain: Not on file  . Food insecurity:    Worry: Not on file    Inability: Not on file  . Transportation needs:    Medical: Not on file    Non-medical: Not on file  Tobacco Use  . Smoking status: Current Every Day Smoker    Packs/day: 0.50    Types: Cigarettes    Start date: 04/01/1989  . Smokeless tobacco: Never Used  .   Tobacco comment: 10 ciggs per day  Substance and Sexual Activity  . Alcohol use: Not Currently    Alcohol/week: 0.0 standard drinks    Frequency: Never    Comment: Quit drinking in MAY  . Drug use: No  . Sexual activity: Not on file  Lifestyle  . Physical activity:    Days per week: Not on file    Minutes per session: Not on file  . Stress: Not on file  Relationships  . Social connections:    Talks on phone: Not on file    Gets together: Not on file    Attends religious service: Not on file    Active member of club or organization: Not on file    Attends meetings of clubs or organizations: Not on file    Relationship status: Not on file  . Intimate partner violence:    Fear of current or ex partner: Not on file     Emotionally abused: Not on file    Physically abused: Not on file    Forced sexual activity: Not on file  Other Topics Concern  . Not on file  Social History Narrative  . Not on file     BP 128/74 (BP Location: Left Arm)   Pulse 65   Ht 5' 6" (1.676 m)   Wt 178 lb (80.7 kg)   SpO2 99%   BMI 28.73 kg/m   Physical Exam:  Well appearing NAD HEENT: Unremarkable Neck:  No JVD, no thyromegally Lymphatics:  No adenopathy Back:  No CVA tenderness Lungs:  Clear with no wheezes HEART:  Regular rate rhythm, no murmurs, no rubs, no clicks Abd:  soft, positive bowel sounds, no organomegally, no rebound, no guarding Ext:  2 plus pulses, no edema, no cyanosis, no clubbing Skin:  No rashes no nodules Neuro:  CN II through XII intact, motor grossly intact  EKG - none  DEVICE  Normal device function.  See PaceArt for details.   Assess/Plan: 1. ICD pocket infection - I tried to probe his pocket but it was very painful. He will need to have his incision revised.  We will schedule this tomorrow. 2. VF - he has had no recurrent symptoms. He has his life vest at home but is not wearing it.  Ronisha Herringshaw,M.D. 

## 2018-04-29 NOTE — Patient Instructions (Signed)
Medication Instructions:  Your physician recommends that you continue on your current medications as directed. Please refer to the Current Medication list given to you today.  If you need a refill on your cardiac medications before your next appointment, please call your pharmacy.   Lab work: Your physician recommends that you return for lab work in: Today   If you have labs (blood work) drawn today and your tests are completely normal, you will receive your results only by: Marland Kitchen MyChart Message (if you have MyChart) OR . A paper copy in the mail If you have any lab test that is abnormal or we need to change your treatment, we will call you to review the results.  Testing/Procedures: NONE   Follow-Up: At [facility], you and your health needs are our priority.  As part of our continuing mission to provide you with exceptional heart care, we have created designated Provider Care Teams.  These Care Teams include your primary Cardiologist (physician) and Advanced Practice Providers (APPs -  Physician Assistants and Nurse Practitioners) who all work together to provide you with the care you need, when you need it. You will need a follow up appointment in to be determined   Please call our office 2 months in advance to schedule this appointment.  You may see Lewayne Bunting, MD or one of the following Advanced Practice Providers on your designated Care Team:   Gypsy Balsam, NP . Francis Dowse, PA-C  Any Other Special Instructions Will Be Listed Below (If Applicable). Thank you for choosing [facility]!

## 2018-05-06 ENCOUNTER — Ambulatory Visit (HOSPITAL_COMMUNITY): Admission: RE | Admit: 2018-05-06 | Payer: Self-pay | Source: Home / Self Care | Admitting: Internal Medicine

## 2018-05-06 ENCOUNTER — Ambulatory Visit (HOSPITAL_COMMUNITY)
Admission: RE | Admit: 2018-05-06 | Discharge: 2018-05-06 | Disposition: A | Discharge status: Home / Self Care | Payer: Self-pay | Attending: Internal Medicine | Admitting: Internal Medicine

## 2018-05-06 ENCOUNTER — Encounter (HOSPITAL_COMMUNITY): Admission: RE | Payer: Self-pay | Source: Home / Self Care

## 2018-05-06 ENCOUNTER — Other Ambulatory Visit: Payer: Self-pay

## 2018-05-06 ENCOUNTER — Encounter (HOSPITAL_COMMUNITY)
Admission: RE | Disposition: A | Discharge status: Home / Self Care | Payer: Self-pay | Source: Home / Self Care | Attending: Internal Medicine

## 2018-05-06 DIAGNOSIS — Y713 Surgical instruments, materials and cardiovascular devices (including sutures) associated with adverse incidents: Secondary | ICD-10-CM | POA: Insufficient documentation

## 2018-05-06 DIAGNOSIS — I10 Essential (primary) hypertension: Secondary | ICD-10-CM | POA: Insufficient documentation

## 2018-05-06 DIAGNOSIS — Z8674 Personal history of sudden cardiac arrest: Secondary | ICD-10-CM | POA: Insufficient documentation

## 2018-05-06 DIAGNOSIS — Z7982 Long term (current) use of aspirin: Secondary | ICD-10-CM | POA: Insufficient documentation

## 2018-05-06 DIAGNOSIS — T827XXA Infection and inflammatory reaction due to other cardiac and vascular devices, implants and grafts, initial encounter: Secondary | ICD-10-CM | POA: Insufficient documentation

## 2018-05-06 DIAGNOSIS — T827XXD Infection and inflammatory reaction due to other cardiac and vascular devices, implants and grafts, subsequent encounter: Secondary | ICD-10-CM

## 2018-05-06 DIAGNOSIS — M1990 Unspecified osteoarthritis, unspecified site: Secondary | ICD-10-CM | POA: Insufficient documentation

## 2018-05-06 DIAGNOSIS — I252 Old myocardial infarction: Secondary | ICD-10-CM | POA: Insufficient documentation

## 2018-05-06 DIAGNOSIS — F17210 Nicotine dependence, cigarettes, uncomplicated: Secondary | ICD-10-CM | POA: Insufficient documentation

## 2018-05-06 HISTORY — PX: POCKET REVISION/RELOCATION: EP1222

## 2018-05-06 LAB — CBC
HEMATOCRIT: 43.7 % (ref 39.0–52.0)
Hemoglobin: 14.9 g/dL (ref 13.0–17.0)
MCH: 29 pg (ref 26.0–34.0)
MCHC: 34.1 g/dL (ref 30.0–36.0)
MCV: 85.2 fL (ref 80.0–100.0)
Platelets: 294 10*3/uL (ref 150–400)
RBC: 5.13 MIL/uL (ref 4.22–5.81)
RDW: 12.6 % (ref 11.5–15.5)
WBC: 4.9 10*3/uL (ref 4.0–10.5)
nRBC: 0 % (ref 0.0–0.2)

## 2018-05-06 LAB — SURGICAL PCR SCREEN
MRSA, PCR: NEGATIVE
Staphylococcus aureus: NEGATIVE

## 2018-05-06 SURGERY — POCKET REVISION/RELOCATION

## 2018-05-06 MED ORDER — BUPIVACAINE HCL (PF) 0.25 % IJ SOLN
INTRAMUSCULAR | Status: AC
  Filled 2018-05-06: qty 30

## 2018-05-06 MED ORDER — MIDAZOLAM HCL 5 MG/5ML IJ SOLN
INTRAMUSCULAR | Status: AC
  Filled 2018-05-06: qty 5

## 2018-05-06 MED ORDER — FENTANYL CITRATE (PF) 100 MCG/2ML IJ SOLN
INTRAMUSCULAR | Status: AC
  Filled 2018-05-06: qty 2

## 2018-05-06 MED ORDER — HEPARIN (PORCINE) IN NACL 1000-0.9 UT/500ML-% IV SOLN
INTRAVENOUS | Status: AC
  Filled 2018-05-06: qty 500

## 2018-05-06 MED ORDER — HEPARIN (PORCINE) IN NACL 1000-0.9 UT/500ML-% IV SOLN
INTRAVENOUS | Status: DC | PRN
  Administered 2018-05-06: 500 mL

## 2018-05-06 MED ORDER — CEFAZOLIN SODIUM-DEXTROSE 2-4 GM/100ML-% IV SOLN
2.0000 g | INTRAVENOUS | Status: AC
  Administered 2018-05-06: 2 g via INTRAVENOUS
  Filled 2018-05-06: qty 100

## 2018-05-06 MED ORDER — BUPIVACAINE HCL (PF) 0.25 % IJ SOLN
INTRAMUSCULAR | Status: DC | PRN
  Administered 2018-05-06: 90 mL

## 2018-05-06 MED ORDER — ONDANSETRON HCL 4 MG/2ML IJ SOLN: 4.0000 mg | Freq: Four times a day (QID) | INTRAMUSCULAR | Status: DC | PRN

## 2018-05-06 MED ORDER — MUPIROCIN 2 % EX OINT
TOPICAL_OINTMENT | CUTANEOUS | Status: AC
  Administered 2018-05-06: 11:00:00 via NASAL
  Filled 2018-05-06: qty 22

## 2018-05-06 MED ORDER — MIDAZOLAM HCL 5 MG/5ML IJ SOLN
INTRAMUSCULAR | Status: DC | PRN
  Administered 2018-05-06 (×2): 2 mg via INTRAVENOUS
  Administered 2018-05-06 (×5): 1 mg via INTRAVENOUS
  Administered 2018-05-06 (×2): 2 mg via INTRAVENOUS
  Administered 2018-05-06: 1 mg via INTRAVENOUS

## 2018-05-06 MED ORDER — FENTANYL CITRATE (PF) 100 MCG/2ML IJ SOLN
INTRAMUSCULAR | Status: DC | PRN
  Administered 2018-05-06: 12.5 ug via INTRAVENOUS
  Administered 2018-05-06: 25 ug via INTRAVENOUS
  Administered 2018-05-06 (×2): 12.5 ug via INTRAVENOUS
  Administered 2018-05-06 (×4): 25 ug via INTRAVENOUS
  Administered 2018-05-06: 12.5 ug via INTRAVENOUS

## 2018-05-06 MED ORDER — CHLORHEXIDINE GLUCONATE 4 % EX LIQD
60.0000 mL | Freq: Once | CUTANEOUS | Status: DC
  Filled 2018-05-06: qty 60

## 2018-05-06 MED ORDER — KETOROLAC TROMETHAMINE 30 MG/ML IJ SOLN
30.0000 mg | Freq: Once | INTRAMUSCULAR | Status: AC
  Administered 2018-05-06: 30 mg via INTRAVENOUS
  Filled 2018-05-06 (×2): qty 1

## 2018-05-06 MED ORDER — CEFAZOLIN SODIUM-DEXTROSE 2-4 GM/100ML-% IV SOLN
INTRAVENOUS | Status: AC
  Filled 2018-05-06: qty 100

## 2018-05-06 MED ORDER — LIDOCAINE HCL (PF) 1 % IJ SOLN
INTRAMUSCULAR | Status: AC
  Filled 2018-05-06: qty 60

## 2018-05-06 MED ORDER — SODIUM CHLORIDE 0.9 % IV SOLN
INTRAVENOUS | Status: DC
  Administered 2018-05-06: 50 mL/h via INTRAVENOUS

## 2018-05-06 MED ORDER — SODIUM CHLORIDE 0.9 % IV SOLN
80.0000 mg | INTRAVENOUS | Status: AC
  Administered 2018-05-06: 80 mg
  Filled 2018-05-06: qty 2

## 2018-05-06 MED ORDER — ACETAMINOPHEN 325 MG PO TABS
325.0000 mg | ORAL_TABLET | ORAL | Status: DC | PRN
  Filled 2018-05-06: qty 2

## 2018-05-06 MED ORDER — SODIUM CHLORIDE 0.9 % IV SOLN
INTRAVENOUS | Status: AC
  Filled 2018-05-06: qty 2

## 2018-05-06 SURGICAL SUPPLY — 2 items
PAD PRO RADIOLUCENT 2001M-C (PAD) ×3 IMPLANT
TRAY PACEMAKER INSERTION (PACKS) ×3 IMPLANT

## 2018-05-06 NOTE — Interval H&P Note (Signed)
History and Physical Interval Note:  05/06/2018 12:23 PM  Tony Reynolds  has presented today for surgery, with the diagnosis of Rule out infection  The various methods of treatment have been discussed with the patient and family. After consideration of risks, benefits and other options for treatment, the patient has consented to  Procedure(s): POCKET REVISION (N/A) as a surgical intervention .  The patient's history has been reviewed, patient examined, no change in status, stable for surgery.  I have reviewed the patient's chart and labs.  Questions were answered to the patient's satisfaction.     Lewayne Bunting

## 2018-05-06 NOTE — Discharge Instructions (Signed)
Call Dr Lubertha Basque office if any problems,questions, or concerns; Call Dr Lubertha Basque office if any fever,pain,swelling,bleeding,redness or drainage at site

## 2018-05-06 NOTE — Progress Notes (Addendum)
Client c/o IV hurting when toradol given IV and stopped giving toradol  IV after 1/2 cc of 1cc of toradol and asked client if he wanted me to start another IV and client states "no, get all of this stuff off of me and I am leaving" client refused to stay after medication given

## 2018-05-06 NOTE — Progress Notes (Signed)
Awakens to verbal stimuli and oriented to place and person

## 2018-05-06 NOTE — Progress Notes (Signed)
CSW consulted as RN expressed that pt has expressed having difficulties with depression. CSW spoke with pt at bedside and was informed that pt has been having these thoughts since having heart attack 11 months ago. CSW sought further details on what type of thoughts pt have been having and pt just said "different thoughts". CSW continued to probe to see if thoughts were suicidal or homicidal however pt still expressed having thoughts.    CSW provided pt with outpatient mental health resources for [place], [place], and [place]. CSW offered to assist pt in calling [facility]aced however pt expressed that he would do it himself. NO further CSW needs CSW signing off.    Claude Manges Huyen Perazzo, MSW, LCSW-A Emergency Department Clinical Social Worker [phone]

## 2018-05-06 NOTE — Progress Notes (Signed)
Per Dr Ladona Ridgel cardiac monitor until awake and alert; client now awake and alert and cardiac monitor d/c'ed

## 2018-05-06 NOTE — Progress Notes (Signed)
Client came up to desk and states "I want pain medication not tylenol" Dr Ladona Ridgel notified and PA will bring prescription

## 2018-05-07 ENCOUNTER — Ambulatory Visit (INDEPENDENT_AMBULATORY_CARE_PROVIDER_SITE_OTHER): Payer: Self-pay | Admitting: *Deleted

## 2018-05-07 ENCOUNTER — Encounter (HOSPITAL_COMMUNITY): Payer: Self-pay | Admitting: Internal Medicine

## 2018-05-07 ENCOUNTER — Encounter: Payer: Self-pay | Admitting: *Deleted

## 2018-05-07 DIAGNOSIS — Z5189 Encounter for other specified aftercare: Secondary | ICD-10-CM

## 2018-05-07 NOTE — Progress Notes (Signed)
Pt in for wound check and dressing change. Prior dressing noted to be saturated. Small amount of sanguineous drainage noted after removing wick with out difficulty. Area cleaned with normal sailine.surtures noted to be intact, skin is not noted to be red or warm to touch. Clean dry dressing applied.

## 2018-05-20 ENCOUNTER — Ambulatory Visit (INDEPENDENT_AMBULATORY_CARE_PROVIDER_SITE_OTHER): Payer: Self-pay | Admitting: *Deleted

## 2018-05-20 DIAGNOSIS — Z4802 Encounter for removal of sutures: Secondary | ICD-10-CM

## 2018-05-20 NOTE — Progress Notes (Signed)
Pt in office to have sutures removed. 10 sutures removed using clean technique. Edges noted to intact with except  2 mm portion of distal end of first incision. Photo sent to Dr. Ladona Ridgel for review. Pt c/o of continued pain and tenderness from pocket revision. He would like to know if another type of pain med can be prescribed for him?

## 2018-05-24 ENCOUNTER — Telehealth: Payer: Self-pay | Admitting: Internal Medicine

## 2018-05-24 NOTE — Telephone Encounter (Signed)
Patient want's to talk to Dr. Ladona Ridgel in regards to something very personal about his surgery.  Patient would not go into further details.

## 2018-05-25 NOTE — Telephone Encounter (Signed)
Left message requesting call back.  Direct nurse # given.  Advised of appt in [place] with Dr. Ladona Ridgel on 06/02/2018 at 8:45 am.  Continue to monitor.

## 2018-05-25 NOTE — Telephone Encounter (Signed)
Call back received from Pt.  Pt is upset, states nurse who removed his stitches left some stitches in his skin.  Does not feel his wound was healed enough for stitches to be removed.  Pt upset that he was not given stronger pain medicine and more of it.  Pt states he has called his lawyer.  Advised Pt Dr. Ladona Ridgel could see him today or tomorrow in [place].  Pt refuses.  Pt states he will wait for Dr. Ladona Ridgel to return to [facility].  Appt made for 06/02/2018 at 8:45 am.  Attempted to advise Pt when to seek emergency help but Pt was yelling and this nurse was unable to provide any education.  Will notify Dr. Leda Quail nurse.

## 2018-06-02 ENCOUNTER — Encounter: Payer: Self-pay | Admitting: Internal Medicine

## 2018-06-02 ENCOUNTER — Ambulatory Visit (INDEPENDENT_AMBULATORY_CARE_PROVIDER_SITE_OTHER): Payer: Self-pay | Admitting: Internal Medicine

## 2018-06-02 VITALS — BP 116/68 | HR 77 | Ht 71.0 in | Wt 183.0 lb

## 2018-06-02 DIAGNOSIS — I4901 Ventricular fibrillation: Secondary | ICD-10-CM

## 2018-06-02 DIAGNOSIS — T827XXD Infection and inflammatory reaction due to other cardiac and vascular devices, implants and grafts, subsequent encounter: Secondary | ICD-10-CM

## 2018-06-02 MED ORDER — CEPHALEXIN 500 MG PO CAPS: 500.0000 mg | ORAL_CAPSULE | Freq: Two times a day (BID) | ORAL | 0 refills | Status: DC

## 2018-06-02 NOTE — Progress Notes (Signed)
HPI Mr. Andersen returns today for followup of his incision. He is a pleasant 48 yo man with a remote VF arrest who underwent S-ICD insertion complicated by infection, removal of the device and failure to heal with ongoing infection who underwent extensive pocket debridement a month ago. In the interim, he has had recurrent drainage and soreness from his incision which remains quite tender. Unfortunately he has not been back to see me. He denies any fever but he does have some swelling and tenderness.  Allergies  Allergen Reactions  . Tomato Other (See Comments)    No acidic foods - eczema     Current Outpatient Medications  Medication Sig Dispense Refill  . aspirin 81 MG chewable tablet Chew 1 tablet (81 mg total) by mouth daily. 30 tablet 0  . atorvastatin (LIPITOR) 20 MG tablet Take 1 tablet (20 mg total) by mouth daily at 6 PM. 90 tablet 3  . losartan (COZAAR) 25 MG tablet Take 2 tablets (50 mg total) by mouth daily. (Patient taking differently: Take 25 mg by mouth daily. ) 180 tablet 3  . sildenafil (VIAGRA) 100 MG tablet Take 100 mg by mouth See admin instructions. Take 1 hour before intercourse  0  . spironolactone (ALDACTONE) 25 MG tablet Take 25 mg by mouth daily.    . tamsulosin (FLOMAX) 0.4 MG CAPS capsule Take 0.4 mg by mouth daily.   3   No current facility-administered medications for this visit.      Past Medical History:  Diagnosis Date  . Arthritis   . Back pain   . Eczema   . Hypertension   . Lumbar disc herniation   . MI (myocardial infarction) (HCC) 2018    ROS:   All systems reviewed and negative except as noted in the HPI.   Past Surgical History:  Procedure Laterality Date  . BILATERAL ANTERIOR TOTAL HIP ARTHROPLASTY Bilateral 12/14/2015   Procedure: BILATERAL ANTERIOR TOTAL HIP ARTHROPLASTY;  Surgeon: Kathryne Hitch, MD;  Location: [facility];  Service: Orthopedics;  Laterality: Bilateral;  . CORONARY/GRAFT ACUTE MI REVASCULARIZATION N/A  05/19/2017   Procedure: Coronary/Graft Acute MI Revascularization;  Surgeon: Tonny Bollman, MD;  [facility] INVAS[facility] CV LAB;  Service: Cardiovascular;  Laterality: N/A;  . ICD GENERATOR REMOVAL N/A 11/09/2017   Procedure: ICD GENERATOR REMOVAL;  Surgeon: Duke Salvia, MD;  Location: [facility] INVASIVE CV LAB;  Service: Cardiovascular;  Laterality: N/A;  . LEFT HEART CATH AND CORONARY ANGIOGRAPHY N/A 05/19/2017   Procedure: LEFT HEART CATH AND CORONARY ANGIOGRAPHY;  Surgeon: Tonny Bollman, MD;  Location: [facility] INVASIVE CV LAB;  Service: Cardiovascular;  Laterality: N/A;  . NO PAST SURGERIES    . POCKET REVISION/RELOCATION N/A 05/06/2018   Procedure: POCKET REVISION;  Surgeon: Marinus Maw, MD;  Location: [facility] INVASIVE CV LAB;  Service: Cardiovascular;  Laterality: N/A;  . SUBQ ICD IMPLANT N/A 07/17/2017   Procedure: SUBQ ICD IMPLANT;  Surgeon: Duke Salvia, MD;  Location: [facility] INVASIVE CV LAB;  Service: Cardiovascular;  Laterality: N/A;     History reviewed. No pertinent family history.   Social History   Socioeconomic History  . Marital status: Married    Spouse name: Not on file  . Number of children: Not on file  . Years of education: Not on file  . Highest education level: Not on file  Occupational History  . Not on file  Social Needs  . Financial resource strain: Not on file  . Food insecurity:  Worry: Not on file    Inability: Not on file  . Transportation needs:    Medical: Not on file    Non-medical: Not on file  Tobacco Use  . Smoking status: Current Every Day Smoker    Packs/day: 0.50    Types: Cigarettes    Start date: 04/01/1989  . Smokeless tobacco: Never Used  . Tobacco comment: 10 ciggs per day  Substance and Sexual Activity  . Alcohol use: Not Currently    Alcohol/week: 0.0 standard drinks    Frequency: Never    Comment: Quit drinking in MAY  . Drug use: No  . Sexual activity: Not on file  Lifestyle  . Physical activity:    Days per week: Not on file     Minutes per session: Not on file  . Stress: Not on file  Relationships  . Social connections:    Talks on phone: Not on file    Gets together: Not on file    Attends religious service: Not on file    Active member of club or organization: Not on file    Attends meetings of clubs or organizations: Not on file    Relationship status: Not on file  . Intimate partner violence:    Fear of current or ex partner: Not on file    Emotionally abused: Not on file    Physically abused: Not on file    Forced sexual activity: Not on file  Other Topics Concern  . Not on file  Social History Narrative  . Not on file     BP 116/68 (BP Location: Right Arm)   Pulse 77   Ht 5\' 11"  (1.803 m)   Wt 183 lb (83 kg)   SpO2 98%   BMI 25.52 kg/m   Physical Exam:  Well appearing NAD HEENT: Unremarkable Neck:  No JVD, no thyromegally Lymphatics:  No adenopathy Back:  No CVA tenderness Lungs:  Clear with no wheezes HEART:  Regular rate rhythm, no murmurs, no rubs, no clicks Abd:  soft, positive bowel sounds, no organomegally, no rebound, no guarding Ext:  2 plus pulses, no edema, no cyanosis, no clubbing Skin:  No rashes no nodules Neuro:  CN II through XII intact, motor grossly intact   Assess/Plan: 1. Device infection - he has undergone extensive debridement. Unfortunately he continues to drain with lack of healing. He is tender but no systemically ill. He will need a surgical consultation. I will prescribe him anti-biotics. I will refer him to surgery. We debrided him as much as we can without general anesthesia. He will need a wound vac I suspect. 2. Chronic systolic heart failure - he has know LV dysfunction. His systolic heart failure symptoms are class 2A at worst. He will continue his current meds. He is not on a beta blocker due to bradycardia.  3. 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 He will continue his current meds.  Leonia Reeves.D.

## 2018-06-02 NOTE — Patient Instructions (Signed)
Medication Instructions:  Your physician recommends that you continue on your current medications as directed. Please refer to the Current Medication list given to you today.  If you need a refill on your cardiac medications before your next appointment, please call your pharmacy.   Lab work: NONE  If you have labs (blood work) drawn today and your tests are completely normal, you will receive your results only by: Marland Kitchen MyChart Message (if you have MyChart) OR . A paper copy in the mail If you have any lab test that is abnormal or we need to change your treatment, we will call you to review the results.  Testing/Procedures: NONE   Follow-Up: At [facility], you and your health needs are our priority.  As part of our continuing mission to provide you with exceptional heart care, we have created designated Provider Care Teams.  These Care Teams include your primary Cardiologist (physician) and Advanced Practice Providers (APPs -  Physician Assistants and Nurse Practitioners) who all work together to provide you with the care you need, when you need it. You will need a follow up appointment in 4 weeks.  Please call our office 2 months in advance to schedule this appointment.  You may see Lewayne Bunting, MD or one of the following Advanced Practice Providers on your designated Care Team:   Gypsy Balsam, NP . Francis Dowse, PA-C  Any Other Special Instructions Will Be Listed Below (If Applicable). Thank you for choosing [facility]!

## 2018-06-04 ENCOUNTER — Ambulatory Visit (INDEPENDENT_AMBULATORY_CARE_PROVIDER_SITE_OTHER): Payer: Self-pay | Admitting: Urology

## 2018-06-04 DIAGNOSIS — R351 Nocturia: Secondary | ICD-10-CM

## 2018-06-04 DIAGNOSIS — N5201 Erectile dysfunction due to arterial insufficiency: Secondary | ICD-10-CM

## 2018-06-04 DIAGNOSIS — N401 Enlarged prostate with lower urinary tract symptoms: Secondary | ICD-10-CM

## 2018-06-04 DIAGNOSIS — N500 Atrophy of testis: Secondary | ICD-10-CM

## 2018-06-07 ENCOUNTER — Telehealth: Payer: Self-pay | Admitting: Internal Medicine

## 2018-06-07 NOTE — Telephone Encounter (Signed)
Follow Up:     Please call, concerning paper work that was left here in December. They said they have not received it.

## 2018-06-07 NOTE — Telephone Encounter (Signed)
Pt's wife will bring another copy of FMLA paperwork to be filled out and signed.

## 2018-06-09 ENCOUNTER — Other Ambulatory Visit: Payer: Self-pay | Admitting: *Deleted

## 2018-06-09 DIAGNOSIS — T827XXD Infection and inflammatory reaction due to other cardiac and vascular devices, implants and grafts, subsequent encounter: Secondary | ICD-10-CM

## 2018-06-09 NOTE — Progress Notes (Signed)
Called to notify pt of surgery consult with Dr. Corliss Skains. No answer, left msg to call back.

## 2018-06-30 ENCOUNTER — Encounter: Payer: Self-pay | Admitting: Internal Medicine

## 2018-06-30 ENCOUNTER — Ambulatory Visit (INDEPENDENT_AMBULATORY_CARE_PROVIDER_SITE_OTHER): Payer: Self-pay | Admitting: Internal Medicine

## 2018-06-30 VITALS — BP 148/94 | HR 68 | Ht 66.0 in | Wt 185.0 lb

## 2018-06-30 DIAGNOSIS — I4901 Ventricular fibrillation: Secondary | ICD-10-CM

## 2018-06-30 DIAGNOSIS — T827XXD Infection and inflammatory reaction due to other cardiac and vascular devices, implants and grafts, subsequent encounter: Secondary | ICD-10-CM

## 2018-06-30 NOTE — Patient Instructions (Addendum)
Medication Instructions:  Your physician recommends that you continue on your current medications as directed. Please refer to the Current Medication list given to you today.  WILL ORDER LIFE VEST FOR 3 MORE MONTHS   Labwork: NONE  Testing/Procedures: NONE  Follow-Up: Your physician recommends that you schedule a follow-up appointment in: 6 WEEKS    Any Other Special Instructions Will Be Listed Below (If Applicable).  PLEASE CALL DR. Lubertha Basque OFFICE IF YOU NOTICE ANY DRAINAGE FROM INCISION   If you need a refill on your cardiac medications before your next appointment, please call your pharmacy.

## 2018-06-30 NOTE — Progress Notes (Signed)
HPI [name] returns today for followup of his incision. He was supposed to go and see Dr. Corliss Reynolds but claims the message was sent to his wifes phone and he did not get it. He was prescribed anti-biotics when I saw him last and he has improved. His old S-ICD incision is no longer draining but has a 3 cm area of tenderness. He is now wearing his life vest. He needs to have it renewed. He denies chest pain or sob.  Allergies  Allergen Reactions  . Tomato Other (See Comments)    No acidic foods - eczema     Current Outpatient Medications  Medication Sig Dispense Refill  . aspirin 81 MG chewable tablet Chew 1 tablet (81 mg total) by mouth daily. 30 tablet 0  . atorvastatin (LIPITOR) 20 MG tablet Take 1 tablet (20 mg total) by mouth daily at 6 PM. 90 tablet 3  . cephALEXin (KEFLEX) 500 MG capsule Take 1 capsule (500 mg total) by mouth 2 (two) times daily. 20 capsule 0  . losartan (COZAAR) 25 MG tablet Take 2 tablets (50 mg total) by mouth daily. (Patient taking differently: Take 25 mg by mouth daily. ) 180 tablet 3  . sildenafil (VIAGRA) 100 MG tablet Take 100 mg by mouth See admin instructions. Take 1 hour before intercourse  0  . spironolactone (ALDACTONE) 25 MG tablet Take 25 mg by mouth daily.    . tamsulosin (FLOMAX) 0.4 MG CAPS capsule Take 0.4 mg by mouth daily.   3   No current facility-administered medications for this visit.      Past Medical History:  Diagnosis Date  . Arthritis   . Back pain   . Eczema   . Hypertension   . Lumbar disc herniation   . MI (myocardial infarction) (HCC) 2018    ROS:   All systems reviewed and negative except as noted in the HPI.   Past Surgical History:  Procedure Laterality Date  . BILATERAL ANTERIOR TOTAL HIP ARTHROPLASTY Bilateral 12/14/2015   Procedure: BILATERAL ANTERIOR TOTAL HIP ARTHROPLASTY;  Surgeon: Kathryne Hitch, MD;  Location: [facility];  Service: Orthopedics;  Laterality: Bilateral;  . CORONARY/GRAFT ACUTE MI  REVASCULARIZATION N/A 05/19/2017   Procedure: Coronary/Graft Acute MI Revascularization;  Surgeon: Tonny Bollman, MD;  Location: [facility] INVASIVE CV LAB;  Service: Cardiovascular;  Laterality: N/A;  . ICD GENERATOR REMOVAL N/A 11/09/2017   Procedure: ICD GENERATOR REMOVAL;  Surgeon: Duke Salvia, MD;  Location: [facility] INVASIVE CV LAB;  Service: Cardiovascular;  Laterality: N/A;  . LEFT HEART CATH AND CORONARY ANGIOGRAPHY N/A 05/19/2017   Procedure: LEFT HEART CATH AND CORONARY ANGIOGRAPHY;  Surgeon: Tonny Bollman, MD;  Location: [facility] INVASIVE CV LAB;  Service: Cardiovascular;  Laterality: N/A;  . NO PAST SURGERIES    . POCKET REVISION/RELOCATION N/A 05/06/2018   Procedure: POCKET REVISION;  Surgeon: Marinus Maw, MD;  Location: [facility] INVASIVE CV LAB;  Service: Cardiovascular;  Laterality: N/A;  . SUBQ ICD IMPLANT N/A 07/17/2017   Procedure: SUBQ ICD IMPLANT;  Surgeon: Duke Salvia, MD;  Location: [facility] INVASIVE CV LAB;  Service: Cardiovascular;  Laterality: N/A;     No family history on file.   Social History   Socioeconomic History  . Marital status: Married    Spouse name: Not on file  . Number of children: Not on file  . Years of education: Not on file  . Highest education level: Not on file  Occupational History  . Not  on file  Social Needs  . Financial resource strain: Not on file  . Food insecurity:    Worry: Not on file    Inability: Not on file  . Transportation needs:    Medical: Not on file    Non-medical: Not on file  Tobacco Use  . Smoking status: Current Every Day Smoker    Packs/day: 0.50    Types: Cigarettes    Start date: 04/01/1989  . Smokeless tobacco: Never Used  . Tobacco comment: 10 ciggs per day  Substance and Sexual Activity  . Alcohol use: Not Currently    Alcohol/week: 0.0 standard drinks    Frequency: Never    Comment: Quit drinking in MAY  . Drug use: No  . Sexual activity: Not on file  Lifestyle  . Physical activity:    Days per week: Not on file     Minutes per session: Not on file  . Stress: Not on file  Relationships  . Social connections:    Talks on phone: Not on file    Gets together: Not on file    Attends religious service: Not on file    Active member of club or organization: Not on file    Attends meetings of clubs or organizations: Not on file    Relationship status: Not on file  . Intimate partner violence:    Fear of current or ex partner: Not on file    Emotionally abused: Not on file    Physically abused: Not on file    Forced sexual activity: Not on file  Other Topics Concern  . Not on file  Social History Narrative  . Not on file     BP (!) 148/94   Pulse 68   Ht 5\' 6"  (1.676 m)   Wt 185 lb (83.9 kg)   SpO2 97%   BMI 29.86 kg/m   Physical Exam:  Well appearing NAD HEENT: Unremarkable Neck:  No JVD, no thyromegally Lymphatics:  No adenopathy Back:  No CVA tenderness Lungs:  Clear with no wheezes HEART:  Regular rate rhythm, no murmurs, no rubs, no clicks Abd:  soft, positive bowel sounds, no organomegally, no rebound, no guarding Ext:  2 plus pulses, no edema, no cyanosis, no clubbing Skin:  No rashes no nodules Neuro:  CN II through XII intact, motor grossly intact   Assess/Plan: 1. VF arrest - he is s/p S-ICD which resulted in infection. He is wearing a life vest again and I would like for him to continue for another 3 months.  2. ICD infection - he appears to be healing.  I will see him back in 6 weeks. If he continues to heal, I will plan on re-implanting a standard ICD in 3 months. If his incision begins to drain again, I will ask him to go back and see the surgeons.  3. Chronic systolich heart failure - his symptoms are class 2. He will continue his current meds.  Leonia Reeves.D.

## 2018-07-09 IMAGING — CR DG CHEST 2V
2 series · 2 of 2 positions shown · non-contrast
Comparison: 05/21/2017

CLINICAL DATA: AICD

EXAM:
CHEST  2 VIEW

[w chest pa]
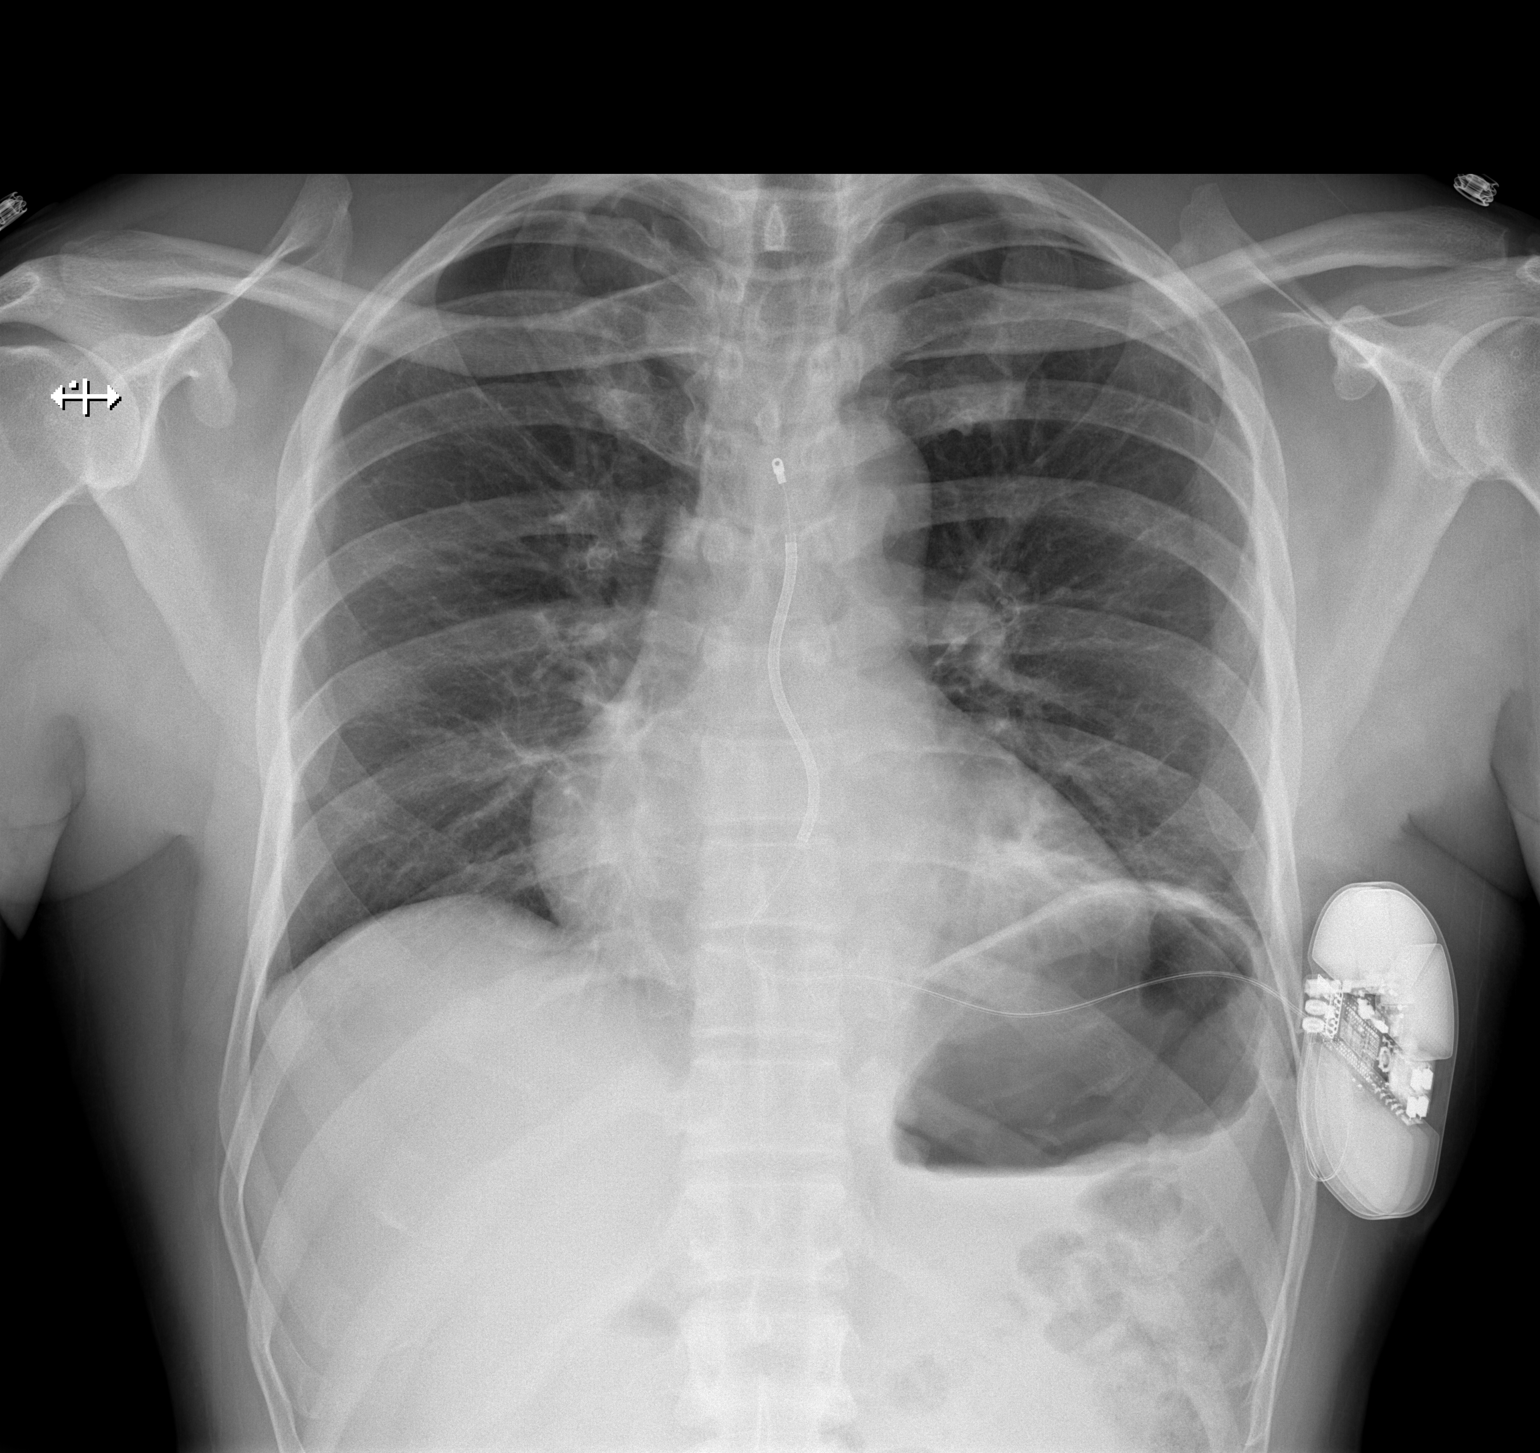

[w chest lat]
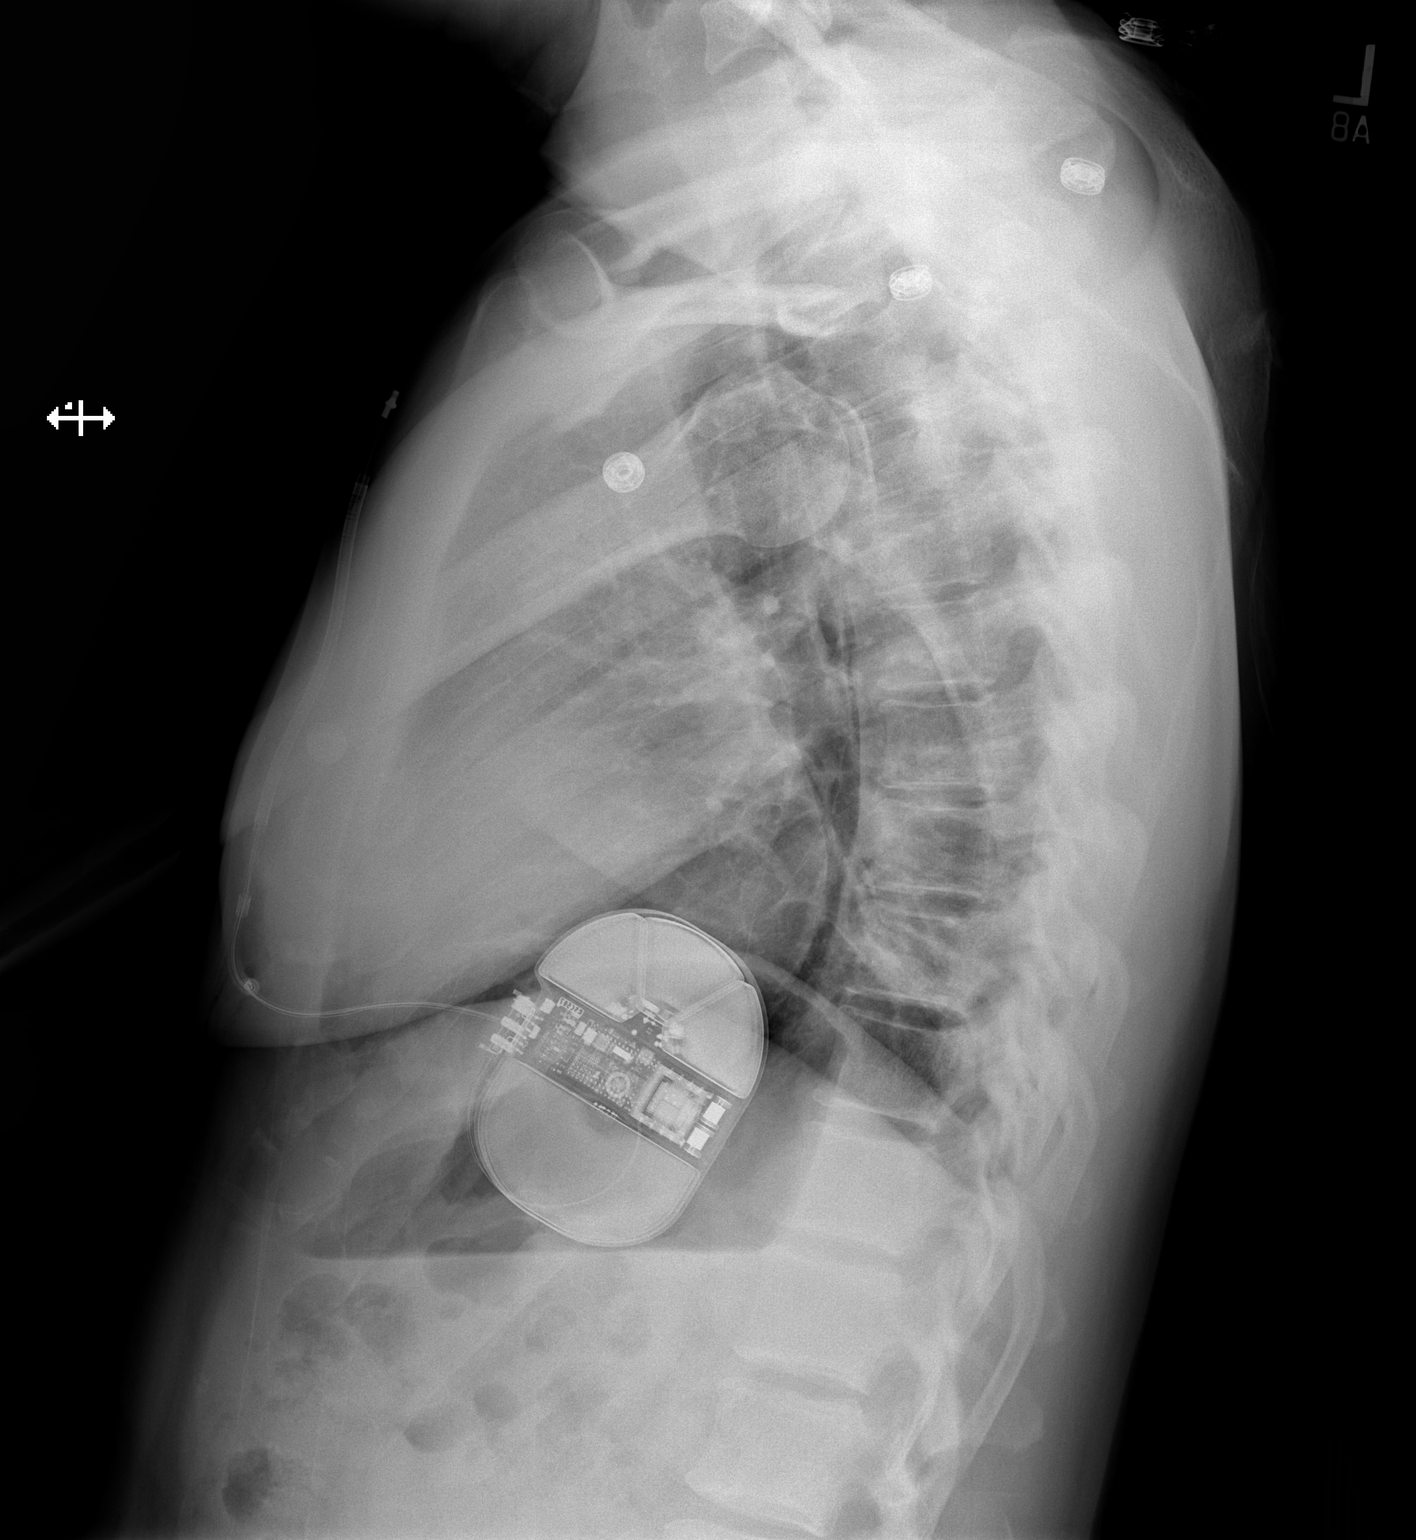

[2 of 2 positions shown; findings below may reference images not displayed]

FINDINGS: Pacer generator in the left lower lateral chest wall. Subcutaneous
ascending ICD with tip of the lead at the level of aortic arch and
just slightly to the left of midline. Minimal atelectasis at the
left base. No pleural effusion. Borderline to mild cardiomegaly. No
pneumothorax.
IMPRESSION: Subcutaneous ICD with lead tip projecting at the level of aortic
arch. Subsegmental atelectasis at the left lung base.

## 2018-07-16 ENCOUNTER — Ambulatory Visit (INDEPENDENT_AMBULATORY_CARE_PROVIDER_SITE_OTHER): Payer: Self-pay | Admitting: Urology

## 2018-07-16 DIAGNOSIS — R351 Nocturia: Secondary | ICD-10-CM

## 2018-07-16 DIAGNOSIS — N5201 Erectile dysfunction due to arterial insufficiency: Secondary | ICD-10-CM

## 2018-07-16 DIAGNOSIS — N401 Enlarged prostate with lower urinary tract symptoms: Secondary | ICD-10-CM

## 2018-07-23 ENCOUNTER — Other Ambulatory Visit: Payer: Self-pay

## 2018-07-23 ENCOUNTER — Other Ambulatory Visit (HOSPITAL_COMMUNITY)
Admission: RE | Admit: 2018-07-23 | Discharge: 2018-07-23 | Disposition: A | Discharge status: Home / Self Care | Payer: Self-pay | Source: Ambulatory Visit | Attending: Urology | Admitting: Urology

## 2018-07-23 DIAGNOSIS — E230 Hypopituitarism: Secondary | ICD-10-CM | POA: Insufficient documentation

## 2018-07-24 LAB — TESTOSTERONE,FREE AND TOTAL
Testosterone, Free: 11.3 pg/mL (ref 6.8–21.5)
Testosterone: 696 ng/dL (ref 264–916)

## 2018-07-24 LAB — ESTRADIOL: Estradiol: 43.9 pg/mL — ABNORMAL HIGH (ref 7.6–42.6)

## 2018-07-24 LAB — FOLLICLE STIMULATING HORMONE: FSH: 7 m[IU]/mL (ref 1.5–12.4)

## 2018-07-24 LAB — LUTEINIZING HORMONE: LH: 3.5 m[IU]/mL (ref 1.7–8.6)

## 2018-08-18 ENCOUNTER — Ambulatory Visit: Payer: Self-pay | Admitting: Internal Medicine

## 2018-11-12 ENCOUNTER — Encounter (HOSPITAL_COMMUNITY): Payer: Self-pay | Admitting: *Deleted

## 2018-11-12 ENCOUNTER — Telehealth: Payer: Self-pay | Admitting: Internal Medicine

## 2018-11-12 ENCOUNTER — Inpatient Hospital Stay (HOSPITAL_COMMUNITY)
Admission: EM | Admit: 2018-11-12 | Discharge: 2018-11-17 | DRG: 308 | Disposition: E | Payer: BC Managed Care – PPO | Attending: Family Medicine | Admitting: Family Medicine

## 2018-11-12 ENCOUNTER — Ambulatory Visit: Payer: Self-pay | Admitting: Urology

## 2018-11-12 ENCOUNTER — Emergency Department (HOSPITAL_COMMUNITY): Payer: BC Managed Care – PPO

## 2018-11-12 ENCOUNTER — Other Ambulatory Visit: Payer: Self-pay

## 2018-11-12 DIAGNOSIS — D649 Anemia, unspecified: Secondary | ICD-10-CM | POA: Diagnosis not present

## 2018-11-12 DIAGNOSIS — E878 Other disorders of electrolyte and fluid balance, not elsewhere classified: Secondary | ICD-10-CM | POA: Diagnosis not present

## 2018-11-12 DIAGNOSIS — Z5329 Procedure and treatment not carried out because of patient's decision for other reasons: Secondary | ICD-10-CM | POA: Diagnosis present

## 2018-11-12 DIAGNOSIS — I110 Hypertensive heart disease with heart failure: Secondary | ICD-10-CM | POA: Diagnosis present

## 2018-11-12 DIAGNOSIS — Z7982 Long term (current) use of aspirin: Secondary | ICD-10-CM

## 2018-11-12 DIAGNOSIS — I248 Other forms of acute ischemic heart disease: Secondary | ICD-10-CM | POA: Diagnosis present

## 2018-11-12 DIAGNOSIS — I5022 Chronic systolic (congestive) heart failure: Secondary | ICD-10-CM | POA: Diagnosis present

## 2018-11-12 DIAGNOSIS — Z79899 Other long term (current) drug therapy: Secondary | ICD-10-CM

## 2018-11-12 DIAGNOSIS — R001 Bradycardia, unspecified: Secondary | ICD-10-CM | POA: Diagnosis present

## 2018-11-12 DIAGNOSIS — Z515 Encounter for palliative care: Secondary | ICD-10-CM | POA: Diagnosis not present

## 2018-11-12 DIAGNOSIS — E876 Hypokalemia: Secondary | ICD-10-CM | POA: Diagnosis not present

## 2018-11-12 DIAGNOSIS — Z20828 Contact with and (suspected) exposure to other viral communicable diseases: Secondary | ICD-10-CM | POA: Diagnosis present

## 2018-11-12 DIAGNOSIS — I428 Other cardiomyopathies: Secondary | ICD-10-CM | POA: Diagnosis present

## 2018-11-12 DIAGNOSIS — R55 Syncope and collapse: Secondary | ICD-10-CM | POA: Diagnosis present

## 2018-11-12 DIAGNOSIS — I499 Cardiac arrhythmia, unspecified: Secondary | ICD-10-CM | POA: Diagnosis not present

## 2018-11-12 DIAGNOSIS — F17200 Nicotine dependence, unspecified, uncomplicated: Secondary | ICD-10-CM | POA: Diagnosis present

## 2018-11-12 DIAGNOSIS — R402212 Coma scale, best verbal response, none, at arrival to emergency department: Secondary | ICD-10-CM | POA: Diagnosis present

## 2018-11-12 DIAGNOSIS — E872 Acidosis: Secondary | ICD-10-CM | POA: Diagnosis not present

## 2018-11-12 DIAGNOSIS — G931 Anoxic brain damage, not elsewhere classified: Secondary | ICD-10-CM | POA: Diagnosis present

## 2018-11-12 DIAGNOSIS — R579 Shock, unspecified: Secondary | ICD-10-CM | POA: Diagnosis not present

## 2018-11-12 DIAGNOSIS — E162 Hypoglycemia, unspecified: Secondary | ICD-10-CM | POA: Diagnosis not present

## 2018-11-12 DIAGNOSIS — I509 Heart failure, unspecified: Secondary | ICD-10-CM

## 2018-11-12 DIAGNOSIS — Z452 Encounter for adjustment and management of vascular access device: Secondary | ICD-10-CM | POA: Diagnosis not present

## 2018-11-12 DIAGNOSIS — Z96643 Presence of artificial hip joint, bilateral: Secondary | ICD-10-CM

## 2018-11-12 DIAGNOSIS — R402112 Coma scale, eyes open, never, at arrival to emergency department: Secondary | ICD-10-CM | POA: Diagnosis present

## 2018-11-12 DIAGNOSIS — I252 Old myocardial infarction: Secondary | ICD-10-CM

## 2018-11-12 DIAGNOSIS — I10 Essential (primary) hypertension: Secondary | ICD-10-CM | POA: Diagnosis not present

## 2018-11-12 DIAGNOSIS — J9601 Acute respiratory failure with hypoxia: Secondary | ICD-10-CM | POA: Diagnosis present

## 2018-11-12 DIAGNOSIS — Z66 Do not resuscitate: Secondary | ICD-10-CM | POA: Diagnosis not present

## 2018-11-12 DIAGNOSIS — I4581 Long QT syndrome: Secondary | ICD-10-CM | POA: Diagnosis present

## 2018-11-12 DIAGNOSIS — I255 Ischemic cardiomyopathy: Secondary | ICD-10-CM | POA: Diagnosis present

## 2018-11-12 DIAGNOSIS — Z95810 Presence of automatic (implantable) cardiac defibrillator: Secondary | ICD-10-CM | POA: Diagnosis present

## 2018-11-12 DIAGNOSIS — L309 Dermatitis, unspecified: Secondary | ICD-10-CM | POA: Diagnosis present

## 2018-11-12 DIAGNOSIS — R402312 Coma scale, best motor response, none, at arrival to emergency department: Secondary | ICD-10-CM | POA: Diagnosis present

## 2018-11-12 DIAGNOSIS — Z8679 Personal history of other diseases of the circulatory system: Secondary | ICD-10-CM

## 2018-11-12 DIAGNOSIS — I2510 Atherosclerotic heart disease of native coronary artery without angina pectoris: Secondary | ICD-10-CM | POA: Diagnosis present

## 2018-11-12 DIAGNOSIS — I420 Dilated cardiomyopathy: Secondary | ICD-10-CM | POA: Diagnosis not present

## 2018-11-12 DIAGNOSIS — F17210 Nicotine dependence, cigarettes, uncomplicated: Secondary | ICD-10-CM | POA: Diagnosis present

## 2018-11-12 DIAGNOSIS — Z8674 Personal history of sudden cardiac arrest: Secondary | ICD-10-CM

## 2018-11-12 DIAGNOSIS — I4901 Ventricular fibrillation: Principal | ICD-10-CM | POA: Diagnosis present

## 2018-11-12 DIAGNOSIS — I469 Cardiac arrest, cause unspecified: Secondary | ICD-10-CM | POA: Diagnosis not present

## 2018-11-12 DIAGNOSIS — Z9119 Patient's noncompliance with other medical treatment and regimen: Secondary | ICD-10-CM

## 2018-11-12 DIAGNOSIS — M5126 Other intervertebral disc displacement, lumbar region: Secondary | ICD-10-CM | POA: Diagnosis present

## 2018-11-12 DIAGNOSIS — F1010 Alcohol abuse, uncomplicated: Secondary | ICD-10-CM | POA: Diagnosis not present

## 2018-11-12 LAB — CBC WITH DIFFERENTIAL/PLATELET
Abs Immature Granulocytes: 0.01 10*3/uL (ref 0.00–0.07)
Basophils Absolute: 0.1 10*3/uL (ref 0.0–0.1)
Basophils Relative: 1 %
Eosinophils Absolute: 0.1 10*3/uL (ref 0.0–0.5)
Eosinophils Relative: 1 %
HCT: 44.7 % (ref 39.0–52.0)
Hemoglobin: 15.2 g/dL (ref 13.0–17.0)
Immature Granulocytes: 0 %
Lymphocytes Relative: 26 %
Lymphs Abs: 1.7 10*3/uL (ref 0.7–4.0)
MCH: 29 pg (ref 26.0–34.0)
MCHC: 34 g/dL (ref 30.0–36.0)
MCV: 85.3 fL (ref 80.0–100.0)
Monocytes Absolute: 0.4 10*3/uL (ref 0.1–1.0)
Monocytes Relative: 7 %
Neutro Abs: 4.4 10*3/uL (ref 1.7–7.7)
Neutrophils Relative %: 65 %
Platelets: 205 10*3/uL (ref 150–400)
RBC: 5.24 MIL/uL (ref 4.22–5.81)
RDW: 13.2 % (ref 11.5–15.5)
WBC: 6.7 10*3/uL (ref 4.0–10.5)
nRBC: 0 % (ref 0.0–0.2)

## 2018-11-12 LAB — COMPREHENSIVE METABOLIC PANEL
ALT: 27 U/L (ref 0–44)
AST: 35 U/L (ref 15–41)
Albumin: 4.5 g/dL (ref 3.5–5.0)
Alkaline Phosphatase: 89 U/L (ref 38–126)
Anion gap: 9 (ref 5–15)
BUN: 11 mg/dL (ref 6–20)
CO2: 25 mmol/L (ref 22–32)
Calcium: 9.5 mg/dL (ref 8.9–10.3)
Chloride: 108 mmol/L (ref 98–111)
Creatinine, Ser: 0.92 mg/dL (ref 0.61–1.24)
GFR calc Af Amer: >60 mL/min (ref >60)
GFR calc non Af Amer: >60 mL/min (ref >60)
Glucose, Bld: 96 mg/dL (ref 70–99)
Potassium: 3.9 mmol/L (ref 3.5–5.1)
Sodium: 142 mmol/L (ref 135–145)
Total Bilirubin: 0.5 mg/dL (ref 0.3–1.2)
Total Protein: 8 g/dL (ref 6.5–8.1)

## 2018-11-12 LAB — TROPONIN I (HIGH SENSITIVITY)
Troponin I (High Sensitivity): 23 ng/L — ABNORMAL HIGH (ref <18)
Troponin I (High Sensitivity): 29 ng/L — ABNORMAL HIGH (ref <18)

## 2018-11-12 LAB — BRAIN NATRIURETIC PEPTIDE: B Natriuretic Peptide: 128 pg/mL — ABNORMAL HIGH (ref 0.0–100.0)

## 2018-11-12 LAB — MAGNESIUM: Magnesium: 2.1 mg/dL (ref 1.7–2.4)

## 2018-11-12 NOTE — Telephone Encounter (Signed)
New message:     Patient wife calling stating that they had to call the EMS because he passed out today. They took him him to the ER, patient wife calling wanting to get a nurse to call. Patient wife is very concering.

## 2018-11-12 NOTE — H&P (Signed)
History and Physical  MURAD STAPLES IEP:[id number] DOB: [birth year] DOA: 12/01/2018  Referring physician: Sabra Heck MD  PCP: Celene Squibb, MD   Chief Complaint: passed out at home   HPI: Tony Reynolds is a 48 y.o. male smoker with a complex cardiac history including history of V. fib arrest that occurred at home and extensive downtime, history of MI in 2018, history of cardiomyopathy EF 66-06%, complicated history of ICD placement subsequent infection of the device causing it to have to be removed and subsequent wound infection afterwards.  He has been followed by EP cardiology Dr. Cristopher Peru who last saw him in February 2020.  The patient was supposed to be wearing a LifeVest.  However the patient has not been wearing it consistently and admits that he is not wearing it.  Unfortunately for this patient his cardiac EF declined from 60% to 20% over 2-year period from 2017-2019.    The patient reported that he woke up at around 7 AM and drank some coffee and passed out in the bathroom.  He does not recall any symptoms or changes prior to the event.  His wife witnessed the syncope.  There was no seizure activity.  Paramedics were called and the patient was noted to have a sinus rhythm.  He was bradycardic.  He was severely hypertensive with a systolic blood pressure greater than 230.  He complained of weakness in the lower extremities.  He denied chest pain or shortness of breath.  He says that he had been taking his blood pressure medications with no missed doses.  He was not hypoglycemic.  He denies fever cough shortness of breath.  ED course: Patient was noted to be hypertensive with a sinus bradycardia heart rate in the 40s.  He was afebrile.  Blood pressure 147/93.  Pulse ox 100% on room air.  EKG with findings of sinus bradycardia.  Troponin initially was 23 and then repeat was 29.0.  His cardiac BNP was 128.  His other labs were unremarkable.  His chest x-ray did not show any active disease.   His novel coronavirus test is pending.  Review of Systems: All systems reviewed and apart from history of presenting illness, are negative.  Past Medical History:  Diagnosis Date  . Arthritis   . Back pain   . Eczema   . Hypertension   . Lumbar disc herniation   . MI (myocardial infarction) ([facility]) 2018   Past Surgical History:  Procedure Laterality Date  . BILATERAL ANTERIOR TOTAL HIP ARTHROPLASTY Bilateral 12/14/2015   Procedure: BILATERAL ANTERIOR TOTAL HIP ARTHROPLASTY;  Surgeon: Mcarthur Rossetti, MD;  Location: [facility];  Service: Orthopedics;  Laterality: Bilateral;  . CORONARY/GRAFT ACUTE MI REVASCULARIZATION N/A 05/19/2017   Procedure: Coronary/Graft Acute MI Revascularization;  Surgeon: Sherren Mocha, MD;  Location: [place] [facility];  Service: Cardiovascular;  Laterality: N/A;  . ICD GENERATOR REMOVAL N/A 11/09/2017   Procedure: ICD GENERATOR REMOVAL;  Surgeon: Deboraha Sprang, MD;  Location: [place] [facility];  Service: Cardiovascular;  Laterality: N/A;  . LEFT HEART CATH AND CORONARY ANGIOGRAPHY N/A 05/19/2017   Procedure: LEFT HEART CATH AND CORONARY ANGIOGRAPHY;  Surgeon: Sherren Mocha, MD;  Location: [place] [facility];  Service: Cardiovascular;  Laterality: N/A;  . NO PAST SURGERIES    . POCKET REVISION/RELOCATION N/A 05/06/2018   Procedure: POCKET REVISION;  Surgeon: Evans Lance, MD;  Location: [place] [facility];  Service: Cardiovascular;  Laterality: N/A;  . SUBQ ICD  IMPLANT N/A 07/17/2017   Procedure: SUBQ ICD IMPLANT;  Surgeon: Duke SalviaKlein, Steven C, MD;  [facility];  Service: Cardiovascular;  Laterality: N/A;   Social History:  reports that he has been smoking cigarettes. He started smoking about 29 years ago. He has been smoking about 0.50 packs per day. He has never used smokeless tobacco. He reports previous alcohol use. He reports that he does not use drugs.  Allergies  Allergen Reactions  . Tomato Other (See Comments)    No acidic foods -  eczema    History reviewed. No pertinent family history.  Prior to Admission medications   Medication Sig Start Date End Date Taking? Authorizing Provider  atorvastatin (LIPITOR) 20 MG tablet Take 1 tablet (20 mg total) by mouth daily at 6 PM. 06/15/17  Yes Duke SalviaKlein, Steven C, MD  losartan (COZAAR) 25 MG tablet Take 2 tablets (50 mg total) by mouth daily. Patient taking differently: Take 25 mg by mouth daily.  10/27/17  Yes Duke SalviaKlein, Steven C, MD  sildenafil (VIAGRA) 100 MG tablet Take 100 mg by mouth See admin instructions. Take 1 hour before intercourse 01/22/18  Yes [provider]  spironolactone (ALDACTONE) 25 MG tablet Take 25 mg by mouth daily.   Yes [provider]  tadalafil (CIALIS) 20 MG tablet Take 10 mg by mouth as directed. Take 1/2 of a tablet with 1/2 of a sildenafil tablet an hour prior to intercourse as needed 11/03/18  Yes [provider]  tamsulosin (FLOMAX) 0.4 MG CAPS capsule Take 0.4 mg by mouth daily.  03/01/18  Yes [provider]   Physical Exam: Vitals:   Feb 22, 2019 1115 Feb 22, 2019 1200 Feb 22, 2019 1215 Feb 22, 2019 1230  BP:  (!) 149/86  (!) 147/93  Pulse: (!) 49 (!) 43 (!) 46 (!) 44  Resp: 14 16 16 18   Temp:      SpO2:      Weight:      Height:         General exam: Moderately built and nourished patient, lying comfortably supine on the gurney in no obvious distress.  Head, eyes and ENT: Nontraumatic and normocephalic. Pupils equally reacting to light and accommodation. Oral mucosa moist.  Neck: Supple. No JVD, carotid bruit or thyromegaly.  Lymphatics: No lymphadenopathy.  Respiratory system: Clear to auscultation. No increased work of breathing.  Cardiovascular system: S1 and S2 heard, RRR. No JVD, murmurs, gallops, clicks or pedal edema.  Gastrointestinal system: Abdomen is nondistended, soft and nontender. Normal bowel sounds heard. No organomegaly or masses appreciated.  Central nervous system: Alert and oriented. No focal  neurological deficits.  Extremities: Symmetric 5 x 5 power. Peripheral pulses symmetrically felt.   Skin: No rashes or acute findings.  Musculoskeletal system: Negative exam.  Psychiatry: Pleasant and cooperative.  Labs on Admission:  Basic Metabolic Panel: Recent Labs  Lab Feb 22, 2019 1009  NA 142  K 3.9  CL 108  CO2 25  GLUCOSE 96  BUN 11  CREATININE 0.92  CALCIUM 9.5  MG 2.1   Liver Function Tests: Recent Labs  Lab Feb 22, 2019 1009  AST 35  ALT 27  ALKPHOS 89  BILITOT 0.5  PROT 8.0  ALBUMIN 4.5   No results for input(s): LIPASE, AMYLASE in the last 168 hours. No results for input(s): AMMONIA in the last 168 hours. CBC: Recent Labs  Lab Feb 22, 2019 1009  WBC 6.7  NEUTROABS 4.4  HGB 15.2  HCT 44.7  MCV 85.3  PLT 205   Cardiac Enzymes: No  results for input(s): CKTOTAL, CKMB, CKMBINDEX, TROPONINI in the last 168 hours.  BNP (last 3 results) No results for input(s): PROBNP in the last 8760 hours. CBG: No results for input(s): GLUCAP in the last 168 hours.  Radiological Exams on Admission: Dg Chest Port 1 View  Result Date: 10/26/2018 CLINICAL DATA:  Chest pain EXAM: PORTABLE CHEST 1 VIEW COMPARISON:  07/17/2017 FINDINGS: The heart size and mediastinal contours are within normal limits. Both lungs are clear. The visualized skeletal structures are unremarkable. IMPRESSION: No active disease. Electronically Signed   By: Signa Kell M.D.   On: 10/19/2018 10:35    EKG: Personally reviewed. Sinus bradycardia  Assessment/Plan Principal Problem:   Ventricular arrhythmia Active Problems:   Uses LifeVest defibrillator intemittently and not consistently   Syncope and collapse   Sinus bradycardia   Status post bilateral hip replacements   Cardiac arrest (HCC)   ETOH abuse   Coronary artery disease involving native coronary artery of native heart without angina pectoris   Benign essential HTN   Current every day smoker   History of MI (myocardial infarction)    Chronic systolic CHF (congestive heart failure) (HCC)  1. Syncope and collapse- I do worry that he is having recurrent arrhythmias that is contributing to his symptoms.  He refuses to wear the LifeVest consistently and is at high risk for sudden cardiac death.  He is going to be admitted to [facility] cardiac telemetry unit so that he can be seen by the cardiology service.  Check a 2D echocardiogram.  Check TSH.  Monitor on continuous telemetry.  Cycle troponin to rule out acute ischemia. 2. Sinus bradycardia- avoiding all beta-blockers.  Further recommendations from the EP cardiology service. 3. Uncontrolled hypertension-resume home blood pressure medications and follow, IV hydralazine ordered as needed for elevated blood pressure readings. 4. Everyday smoker-patient strongly advised to discontinue all tobacco use.  Will provide a nicotine patch for cravings. 5. History of alcohol abuse-urine drug screen is been requested. 6. Chronic systolic CHF- his most recent EF is 20-25%.  An echocardiogram has been ordered. Pt does not appear to be volume overloaded at this time.   7. Coronary artery disease status post MI-resume home cardiac medications.  Follow troponin testing.  DVT Prophylaxis: Lovenox Code Status: Full Family Communication: Wife updated at bedside Disposition Plan: Inpatient management required  Time spent: 58 minutes  Jeremih Dearmas Laural Benes, MD [facility] How to contact the [facility] Attending or Consulting provider 7A - 7P or covering provider during after hours 7P -7A, for this patient?  1. Check the care team in [facility] and look for a) attending/consulting [facility] provider listed and b) the [facility] team listed 2. Log into [email or web address] and use [place]'s universal password to access. If you do not have the password, please contact the hospital operator. 3. Locate the [facility] provider you are looking for under [facility] and page to a number that you can be directly reached. 4. If you  still have difficulty reaching the provider, please page the [facility] (Director on Call) for the Hospitalists listed on amion for assistance.

## 2018-11-12 NOTE — ED Provider Notes (Signed)
[facility] EMERGENCY DEPARTMENT Provider Note   CSN: [id number] Arrival date & time: 11/30/18  9381     History   Chief Complaint Chief Complaint  Patient presents with  . Loss of Consciousness    HPI Tony Reynolds is a 48 y.o. male.     HPI  The patient is a 48 year old male, he has a history of cardiac arrest which was thought to be ventricular fibrillation, this occurred at home and he had extensive downtime without CPR performed, when he arrived to the hospital he was resuscitated intubated and placed in the intensive care unit.  Heart catheterization showed severe left ventricular dysfunction as did an echocardiogram showing a severely suppressed ejection fraction.  He had no culprit vessels, he stayed in the hospital for some time, was fitted for a pacemaker defibrillator which ultimately became infected and required removal.  He had had some post removal infections which were treated with antibiotics however the patient has refused to wear a LifeVest which was recommended strongly by his cardiologist.  Review of the medical record shows this as well.  The patient states that he was in his usual state of health this morning, he arose at 7:00 in the morning, had some coffee, he then upon leaving the bathroom had a syncopal episode, he was severely hypertensive measuring over 230 systolic per his wife at home.  Paramedics were called and found the patient to be in a sinus rhythm.  The patient did not have any symptoms except for some chest  pain and severe weakness after the event.  It is unclear how long the patient was unresponsive.  He is now having some weakness of legs but no other symptoms.    Additional history obtained from the paramedics who reported that the patient had nothing other than sinus rhythm on the way to the hospital, there was no seizure activity and he did not appear to be postictal  Past Medical History:  Diagnosis Date  . Arthritis   . Back pain   .  Eczema   . Hypertension   . Lumbar disc herniation   . MI (myocardial infarction) (HCC) 2018    Patient Active Problem List   Diagnosis Date Noted  . 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 Dysphagia   . ETOH abuse   . Coronary artery disease involving native coronary artery of native heart without angina pectoris   . Tobacco abuse   . Benign essential HTN   . Agitation   . Hypokalemia   . Cardiac arrest (HCC) 05/19/2017  . 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 BILATERAL ANTERIOR TOTAL HIP ARTHROPLASTY Bilateral 12/14/2015   Procedure: BILATERAL ANTERIOR TOTAL HIP ARTHROPLASTY;  Surgeon: Kathryne Hitch, MD;  Location: [facility];  Service: Orthopedics;  Laterality: Bilateral;  . CORONARY/GRAFT ACUTE MI REVASCULARIZATION N/A 05/19/2017   Procedure: Coronary/Graft Acute MI Revascularization;  Surgeon: Tonny Bollman, MD;  Location: [facility] INVAS[facility];  Service: Cardiovascular;  Laterality: N/A;  . ICD GENERATOR REMOVAL N/A 11/09/2017   Procedure: ICD GENERATOR REMOVAL;  Surgeon: Duke Salvia, MD;  Location: [facility]VAS[facility] CV LAB;  Service: Cardiovascular;  Laterality: N/A;  . LEFT HEART CATH AND CORONARY ANGIOGRAPHY N/A 05/19/2017   Procedure: LEFT HEART CATH AND  CORONARY ANGIOGRAPHY;  Surgeon: Sherren Mocha, MD;  Location: [place] [facility];  Service: Cardiovascular;  Laterality: N/A;  . NO PAST SURGERIES    . POCKET REVISION/RELOCATION N/A 05/06/2018   Procedure: POCKET REVISION;  Surgeon: Evans Lance, MD;  Location: [place] [facility];  Service: Cardiovascular;  Laterality: N/A;  . SUBQ ICD IMPLANT N/A 07/17/2017   Procedure: SUBQ ICD IMPLANT;  Surgeon: Deboraha Sprang, MD;  Location: [place] [facility];   Service: Cardiovascular;  Laterality: N/A;        Home Medications    Prior to Admission medications   Medication Sig Start Date End Date Taking? Authorizing Provider  aspirin 81 MG chewable tablet Chew 1 tablet (81 mg total) by mouth daily. 05/29/17   Alma Friendly, MD  atorvastatin (LIPITOR) 20 MG tablet Take 1 tablet (20 mg total) by mouth daily at 6 PM. 06/15/17   Deboraha Sprang, MD  cephALEXin (KEFLEX) 500 MG capsule Take 1 capsule (500 mg total) by mouth 2 (two) times daily. 06/02/18   Evans Lance, MD  losartan (COZAAR) 25 MG tablet Take 2 tablets (50 mg total) by mouth daily. Patient taking differently: Take 25 mg by mouth daily.  10/27/17   Deboraha Sprang, MD  sildenafil (VIAGRA) 100 MG tablet Take 100 mg by mouth See admin instructions. Take 1 hour before intercourse 01/22/18   [provider]  spironolactone (ALDACTONE) 25 MG tablet Take 25 mg by mouth daily.    [provider]  tamsulosin (FLOMAX) 0.4 MG CAPS capsule Take 0.4 mg by mouth daily.  03/01/18   [provider]    Family History No family history on file.  Social History Social History   Tobacco Use  . Smoking status: Current Every Day Smoker    Packs/day: 0.50    Types: Cigarettes    Start date: 04/01/1989  . Smokeless tobacco: Never Used  . Tobacco comment: 10 ciggs per day  Substance Use Topics  . Alcohol use: Not Currently    Alcohol/week: 0.0 standard drinks    Frequency: Never    Comment: Quit drinking in MAY  . Drug use: No     Allergies   Tomato   Review of Systems Review of Systems  All other systems reviewed and are negative.    Physical Exam Updated Vital Signs BP (!) 149/99 (BP Location: Left Arm)   Pulse (!) 51   Temp 98.1 F (36.7 C)   Resp 15   Ht 1.676 m (5\' 6" )   Wt 77.1 kg   SpO2 100%   BMI 27.44 kg/m   Physical Exam Vitals signs and nursing note reviewed.  Constitutional:      General: He is not in acute distress.     Appearance: He is well-developed.  HENT:     Head: Normocephalic and atraumatic.     Mouth/Throat:     Pharynx: No oropharyngeal exudate.  Eyes:     General: No scleral icterus.       Right eye: No discharge.        Left eye: No discharge.     Conjunctiva/sclera: Conjunctivae normal.     Pupils: Pupils are equal, round, and reactive to light.  Neck:     Musculoskeletal: Normal range of motion and neck supple.     Thyroid: No thyromegaly.     Vascular: No JVD.  Cardiovascular:     Rate and Rhythm: Normal rate and regular rhythm.  Heart sounds: Normal heart sounds. No murmur. No friction rub. No gallop.   Pulmonary:     Effort: Pulmonary effort is normal. No respiratory distress.     Breath sounds: Normal breath sounds. No wheezing or rales.  Abdominal:     General: Bowel sounds are normal. There is no distension.     Palpations: Abdomen is soft. There is no mass.     Tenderness: There is no abdominal tenderness.  Musculoskeletal: Normal range of motion.        General: No tenderness.  Lymphadenopathy:     Cervical: No cervical adenopathy.  Skin:    General: Skin is warm and dry.     Findings: No erythema or rash.  Neurological:     Mental Status: He is alert.     Coordination: Coordination normal.     Comments: Generalized weakness of the bilateral legs though he is able to straight leg raise with 5 out of 5 strength  Psychiatric:        Behavior: Behavior normal.      ED Treatments / Results  Labs (all labs ordered are listed, but only abnormal results are displayed) Labs Reviewed - No data to display  EKG EKG Interpretation  Date/Time:  Friday November 12 2018 09:53:35 EDT Ventricular Rate:  49 PR Interval:    QRS Duration: 110 QT Interval:  429 QTC Calculation: 388 R Axis:   73 Text Interpretation:  Sinus bradycardia Consider left atrial enlargement LVH with IVCD and secondary repol abnrm Since last tracing twa no longer seen mild diffuse ST depression  Confirmed by Eber HongMiller, Jadee Golebiewski ([id number]) on Sep 28, 2018 9:58:58 AM   Radiology Dg Chest Port 1 View  Result Date: Sep 28, 2018 CLINICAL DATA:  Chest pain EXAM: PORTABLE CHEST 1 VIEW COMPARISON:  07/17/2017 FINDINGS: The heart size and mediastinal contours are within normal limits. Both lungs are clear. The visualized skeletal structures are unremarkable. IMPRESSION: No active disease. Electronically Signed   By: Signa Kellaylor  Stroud M.D.   On: 0May 12, 2020 10:35    Procedures Procedures (including critical care time)  Medications Ordered in ED Medications - No data to display   Initial Impression / Assessment and Plan / ED Course  I have reviewed the triage vital signs and the nursing notes.  Pertinent labs & imaging results that were available during my care of the patient were reviewed by me and considered in my medical decision making (see chart for details).  Clinical Course as of Nov 12 1235  Fri Nov 12, 2018  [id number] This patient is potentially critically ill, he is not on an antiarrhythmic medicine, he does not use his LifeVest, suspect this was ventricular syncope until proven otherwise.  EKG with no significant findings   [BM]  1223 Discussed care with Dr. Darl HouseholderKoneswaren who agrees that the patient [facility], he will be at that hospital over the weekend and can participate in the patient's care   [BM]  1236 I discussed the care with the hospitalist, Dr. Laural BenesJohnson who will admit the patient to the cardiac center for transfer and cardiac monitoring   [BM]    Clinical Course User Index [BM] Eber HongMiller, Kamarian Sahakian, MD       The patient is currently in sinus rhythm however with his history of ventricular fibrillation arrest he will be placed on cardiac pads, he will need to be seen by cardiology.  The patient is critically ill after what was likely a ventricular arrhythmia.  He will need  high level of monitoring overnight and consultation with specialist service.  Labs  pending  Final Clinical Impressions(s) / ED Diagnoses   Final diagnoses:  Syncope and collapse  Chronic congestive heart failure, unspecified heart failure type [facility](HCC)      Eber HongMiller, Johnnie Moten, MD 11/01/2018 1237

## 2018-11-12 NOTE — ED Notes (Signed)
Patient was observed by this nurse to be in act of removing EKG leads, stating to this nurse if he wasn't assisted with removing all the lines he will do it himself.  Patient stated he was pleased with the care given to him in this department, but felt like he was done with anything else that needed to be done and that he was going home. This nurse offered to page MD that moment, patient was already dressing and adamant about leaving in that moment. Patient stated " its not you all that bothers me, but I am done with anymore care here".  Patient continues to state he felt like he was not given full care at his last visit to another hospital regarding his cardiac history.  Patient was advised by this nurse of the circumstances and that this was not in his best interest given his cardiac history and that could lead to cardiac arrest if not treated as advised by his treatment team.  Patient stated he understands and feels its best for him to just "go home".  Patient signed AMA in his chart, dressed self and walked out of department.

## 2018-11-12 NOTE — Telephone Encounter (Signed)
Returned call to wife.  Pt last saw Dr. Lovena Le in February and wanted close f/u d/t healing from removal of subcut ICD.  Dr. Lovena Le wanted to replace ICD in May.  But Pt did not f/u in [place] (no recall noted).  Pt went to ER today with syncopal episode and then left AMA.  Spoke with wife.  Reiterated critical heart condition of Pt.  Advised if he had another syncopal episode she needed to call 911 and he needed to go to ER and get tx.  Made first available appt with Dr. Lovena Le on Tuesday.    Wife indicates understanding of all education/direction

## 2018-11-12 NOTE — Discharge Summary (Signed)
10/31/2018  2:11 PM  Pt discharged against medical advice.  He left before I could see him again to discuss his concerns.  I was not notified that he was leaving and I found him gone when I came to check on him in his ED room.  Hopefully he will come back or will go seek treatment at another facility.    Murvin Natal MD  2:13 PM

## 2018-11-12 NOTE — ED Triage Notes (Signed)
EMS reports pt had syncopal episode in bathroom.  Wife called ems.  WHen pt woke up he reports intermittent chest pain.  Denies n/v, fever, or sob.  BP 230/130 initially with ems and ems says pt was visibly upset.  History of MI approx a year ago.  Reports had a defibrillator put in but it was removed.

## 2018-11-12 NOTE — ED Notes (Signed)
Patient left AMA, stating displeasure at care given at last visit to main campus.

## 2018-11-13 ENCOUNTER — Inpatient Hospital Stay (HOSPITAL_COMMUNITY)
Admission: EM | Admit: 2018-11-13 | Discharge: 2018-12-18 | Disposition: E | Payer: BC Managed Care – PPO | Source: Home / Self Care | Attending: Pulmonary Disease | Admitting: Pulmonary Disease

## 2018-11-13 ENCOUNTER — Inpatient Hospital Stay (HOSPITAL_COMMUNITY): Payer: BC Managed Care – PPO

## 2018-11-13 ENCOUNTER — Encounter (HOSPITAL_COMMUNITY): Payer: Self-pay

## 2018-11-13 ENCOUNTER — Emergency Department (HOSPITAL_COMMUNITY): Payer: BC Managed Care – PPO

## 2018-11-13 ENCOUNTER — Encounter (HOSPITAL_COMMUNITY): Admission: EM | Disposition: E | Payer: Self-pay | Source: Home / Self Care | Attending: Pulmonary Disease

## 2018-11-13 DIAGNOSIS — T827XXA Infection and inflammatory reaction due to other cardiac and vascular devices, implants and grafts, initial encounter: Secondary | ICD-10-CM | POA: Diagnosis present

## 2018-11-13 DIAGNOSIS — I469 Cardiac arrest, cause unspecified: Secondary | ICD-10-CM

## 2018-11-13 DIAGNOSIS — Z4659 Encounter for fitting and adjustment of other gastrointestinal appliance and device: Secondary | ICD-10-CM

## 2018-11-13 DIAGNOSIS — I10 Essential (primary) hypertension: Secondary | ICD-10-CM | POA: Diagnosis present

## 2018-11-13 DIAGNOSIS — I499 Cardiac arrhythmia, unspecified: Secondary | ICD-10-CM | POA: Diagnosis present

## 2018-11-13 DIAGNOSIS — I4901 Ventricular fibrillation: Principal | ICD-10-CM

## 2018-11-13 DIAGNOSIS — I2510 Atherosclerotic heart disease of native coronary artery without angina pectoris: Secondary | ICD-10-CM | POA: Diagnosis present

## 2018-11-13 DIAGNOSIS — J9690 Respiratory failure, unspecified, unspecified whether with hypoxia or hypercapnia: Secondary | ICD-10-CM

## 2018-11-13 DIAGNOSIS — F1010 Alcohol abuse, uncomplicated: Secondary | ICD-10-CM | POA: Diagnosis present

## 2018-11-13 DIAGNOSIS — I5022 Chronic systolic (congestive) heart failure: Secondary | ICD-10-CM | POA: Diagnosis present

## 2018-11-13 DIAGNOSIS — Z452 Encounter for adjustment and management of vascular access device: Secondary | ICD-10-CM

## 2018-11-13 LAB — POCT I-STAT 7, (LYTES, BLD GAS, ICA,H+H)
Acid-base deficit: 4 mmol/L — ABNORMAL HIGH (ref 0.0–2.0)
Bicarbonate: 20.3 mmol/L (ref 20.0–28.0)
Calcium, Ion: 1.24 mmol/L (ref 1.15–1.40)
HCT: 49 % (ref 39.0–52.0)
Hemoglobin: 16.7 g/dL (ref 13.0–17.0)
O2 Saturation: 100 %
Patient temperature: 35.4
Potassium: 2.7 mmol/L — CL (ref 3.5–5.1)
Sodium: 143 mmol/L (ref 135–145)
TCO2: 21 mmol/L — ABNORMAL LOW (ref 22–32)
pCO2 arterial: 31.1 mmHg — ABNORMAL LOW (ref 32.0–48.0)
pH, Arterial: 7.415 (ref 7.350–7.450)
pO2, Arterial: 422 mmHg — ABNORMAL HIGH (ref 83.0–108.0)

## 2018-11-13 LAB — CBC WITH DIFFERENTIAL/PLATELET
Abs Immature Granulocytes: 0.04 10*3/uL (ref 0.00–0.07)
Basophils Absolute: 0.1 10*3/uL (ref 0.0–0.1)
Basophils Relative: 1 %
Eosinophils Absolute: 0.2 10*3/uL (ref 0.0–0.5)
Eosinophils Relative: 2 %
HCT: 44.4 % (ref 39.0–52.0)
Hemoglobin: 15.1 g/dL (ref 13.0–17.0)
Immature Granulocytes: 1 %
Lymphocytes Relative: 46 %
Lymphs Abs: 3.7 10*3/uL (ref 0.7–4.0)
MCH: 29.2 pg (ref 26.0–34.0)
MCHC: 34 g/dL (ref 30.0–36.0)
MCV: 85.9 fL (ref 80.0–100.0)
Monocytes Absolute: 0.2 10*3/uL (ref 0.1–1.0)
Monocytes Relative: 3 %
Neutro Abs: 3.9 10*3/uL (ref 1.7–7.7)
Neutrophils Relative %: 47 %
Platelets: 240 10*3/uL (ref 150–400)
RBC: 5.17 MIL/uL (ref 4.22–5.81)
RDW: 12.9 % (ref 11.5–15.5)
WBC: 8 10*3/uL (ref 4.0–10.5)
nRBC: 0 % (ref 0.0–0.2)

## 2018-11-13 LAB — POCT I-STAT, CHEM 8
BUN: 21 mg/dL — ABNORMAL HIGH (ref 6–20)
BUN: 22 mg/dL — ABNORMAL HIGH (ref 6–20)
Calcium, Ion: 1.2 mmol/L (ref 1.15–1.40)
Calcium, Ion: 1.15 mmol/L (ref 1.15–1.40)
Chloride: 112 mmol/L — ABNORMAL HIGH (ref 98–111)
Chloride: 111 mmol/L (ref 98–111)
Creatinine, Ser: 1 mg/dL (ref 0.61–1.24)
Creatinine, Ser: 0.8 mg/dL (ref 0.61–1.24)
Glucose, Bld: 183 mg/dL — ABNORMAL HIGH (ref 70–99)
Glucose, Bld: 242 mg/dL — ABNORMAL HIGH (ref 70–99)
HCT: 50 % (ref 39.0–52.0)
HCT: 48 % (ref 39.0–52.0)
Hemoglobin: 17 g/dL (ref 13.0–17.0)
Hemoglobin: 16.3 g/dL (ref 13.0–17.0)
Potassium: 2.7 mmol/L — CL (ref 3.5–5.1)
Potassium: 2.5 mmol/L — CL (ref 3.5–5.1)
Sodium: 142 mmol/L (ref 135–145)
Sodium: 143 mmol/L (ref 135–145)
TCO2: 22 mmol/L (ref 22–32)
TCO2: 18 mmol/L — ABNORMAL LOW (ref 22–32)

## 2018-11-13 LAB — CBC
HCT: 44.5 % (ref 39.0–52.0)
Hemoglobin: 15.5 g/dL (ref 13.0–17.0)
MCH: 29.4 pg (ref 26.0–34.0)
MCHC: 34.8 g/dL (ref 30.0–36.0)
MCV: 84.4 fL (ref 80.0–100.0)
Platelets: 238 10*3/uL (ref 150–400)
RBC: 5.27 MIL/uL (ref 4.22–5.81)
RDW: 12.9 % (ref 11.5–15.5)
WBC: 12.3 10*3/uL — ABNORMAL HIGH (ref 4.0–10.5)
nRBC: 0 % (ref 0.0–0.2)

## 2018-11-13 LAB — COMPREHENSIVE METABOLIC PANEL
ALT: 117 U/L — ABNORMAL HIGH (ref 0–44)
AST: 142 U/L — ABNORMAL HIGH (ref 15–41)
Albumin: 3.7 g/dL (ref 3.5–5.0)
Alkaline Phosphatase: 116 U/L (ref 38–126)
Anion gap: 17 — ABNORMAL HIGH (ref 5–15)
BUN: 13 mg/dL (ref 6–20)
CO2: 15 mmol/L — ABNORMAL LOW (ref 22–32)
Calcium: 8.8 mg/dL — ABNORMAL LOW (ref 8.9–10.3)
Chloride: 107 mmol/L (ref 98–111)
Creatinine, Ser: 1.73 mg/dL — ABNORMAL HIGH (ref 0.61–1.24)
GFR calc Af Amer: 54 mL/min — ABNORMAL LOW (ref >60)
GFR calc non Af Amer: 47 mL/min — ABNORMAL LOW (ref >60)
Glucose, Bld: 241 mg/dL — ABNORMAL HIGH (ref 70–99)
Potassium: 3.2 mmol/L — ABNORMAL LOW (ref 3.5–5.1)
Sodium: 139 mmol/L (ref 135–145)
Total Bilirubin: 0.5 mg/dL (ref 0.3–1.2)
Total Protein: 6.6 g/dL (ref 6.5–8.1)

## 2018-11-13 LAB — NOVEL CORONAVIRUS, NAA (HOSP ORDER, SEND-OUT TO REF LAB; TAT 18-24 HRS): SARS-CoV-2, NAA: NOT DETECTED

## 2018-11-13 LAB — PROTIME-INR
INR: 1 (ref 0.8–1.2)
Prothrombin Time: 12.8 seconds (ref 11.4–15.2)

## 2018-11-13 LAB — TROPONIN I (HIGH SENSITIVITY)
Troponin I (High Sensitivity): 86 ng/L — ABNORMAL HIGH (ref <18)
Troponin I (High Sensitivity): 620 ng/L (ref <18)
Troponin I (High Sensitivity): 1567 ng/L (ref <18)
Troponin I (High Sensitivity): 2776 ng/L (ref <18)

## 2018-11-13 LAB — APTT: aPTT: 31 seconds (ref 24–36)

## 2018-11-13 LAB — HEMOGLOBIN A1C
Hgb A1c MFr Bld: 5.7 % — ABNORMAL HIGH (ref 4.8–5.6)
Mean Plasma Glucose: 116.89 mg/dL

## 2018-11-13 LAB — MAGNESIUM: Magnesium: 2 mg/dL (ref 1.7–2.4)

## 2018-11-13 LAB — GLUCOSE, CAPILLARY: Glucose-Capillary: 182 mg/dL — ABNORMAL HIGH (ref 70–99)

## 2018-11-13 LAB — LACTIC ACID, PLASMA
Lactic Acid, Venous: 3.3 mmol/L (ref 0.5–1.9)
Lactic Acid, Venous: 3 mmol/L (ref 0.5–1.9)

## 2018-11-13 LAB — MRSA PCR SCREENING: MRSA by PCR: NEGATIVE

## 2018-11-13 LAB — SARS CORONAVIRUS 2 BY RT PCR (HOSPITAL ORDER, PERFORMED IN ~~LOC~~ HOSPITAL LAB): SARS Coronavirus 2: NEGATIVE

## 2018-11-13 LAB — LIPASE, BLOOD: Lipase: 42 U/L (ref 11–51)

## 2018-11-13 SURGERY — INVASIVE LAB ABORTED CASE

## 2018-11-13 MED ORDER — ETOMIDATE 2 MG/ML IV SOLN
20.0000 mg | Freq: Once | INTRAVENOUS | Status: AC
  Administered 2018-11-13: 17:00:00 20 mg via INTRAVENOUS

## 2018-11-13 MED ORDER — ASPIRIN 300 MG RE SUPP
300.0000 mg | RECTAL | Status: AC
  Administered 2018-11-13: 19:00:00 300 mg via RECTAL
  Filled 2018-11-13: qty 1

## 2018-11-13 MED ORDER — ASPIRIN 81 MG PO CHEW
81.0000 mg | CHEWABLE_TABLET | Freq: Every day | ORAL | Status: DC
  Administered 2018-11-14: 09:00:00 81 mg via ORAL
  Filled 2018-11-13: qty 1

## 2018-11-13 MED ORDER — SODIUM CHLORIDE 0.9 % IV SOLN
1.0000 ug/kg/min | INTRAVENOUS | Status: DC
  Administered 2018-11-13: 19:00:00 1 ug/kg/min via INTRAVENOUS
  Filled 2018-11-13: qty 20

## 2018-11-13 MED ORDER — LIDOCAINE HCL (PF) 1 % IJ SOLN
INTRAMUSCULAR | Status: AC
  Filled 2018-11-13: qty 30

## 2018-11-13 MED ORDER — CISATRACURIUM BOLUS VIA INFUSION
0.1000 mg/kg | Freq: Once | INTRAVENOUS | Status: AC
  Administered 2018-11-13: 19:00:00 7.7 mg via INTRAVENOUS
  Filled 2018-11-13: qty 8

## 2018-11-13 MED ORDER — FENTANYL CITRATE (PF) 100 MCG/2ML IJ SOLN
INTRAMUSCULAR | Status: AC
  Administered 2018-11-13: 15:00:00
  Filled 2018-11-13: qty 2

## 2018-11-13 MED ORDER — ROCURONIUM BROMIDE 50 MG/5ML IV SOLN
50.0000 mg | Freq: Once | INTRAVENOUS | Status: AC
  Administered 2018-11-13: 50 mg via INTRAVENOUS
  Filled 2018-11-13: qty 5

## 2018-11-13 MED ORDER — AMIODARONE LOAD VIA INFUSION
150.0000 mg | Freq: Once | INTRAVENOUS | Status: AC
  Administered 2018-11-13: 15:00:00 150 mg via INTRAVENOUS
  Filled 2018-11-13: qty 83.34

## 2018-11-13 MED ORDER — ARTIFICIAL TEARS OPHTHALMIC OINT
1.0000 "application " | TOPICAL_OINTMENT | Freq: Three times a day (TID) | OPHTHALMIC | Status: DC
  Administered 2018-11-13 – 2018-11-14 (×2): 1 via OPHTHALMIC
  Filled 2018-11-13: qty 3.5

## 2018-11-13 MED ORDER — AMIODARONE HCL IN DEXTROSE 360-4.14 MG/200ML-% IV SOLN
60.0000 mg/h | INTRAVENOUS | Status: DC
  Administered 2018-11-13 (×2): 60 mg/h via INTRAVENOUS
  Filled 2018-11-13: qty 200

## 2018-11-13 MED ORDER — POTASSIUM CHLORIDE 10 MEQ/50ML IV SOLN
10.0000 meq | INTRAVENOUS | Status: AC
  Administered 2018-11-13 – 2018-11-14 (×6): 10 meq via INTRAVENOUS

## 2018-11-13 MED ORDER — FENTANYL 2500MCG IN NS 250ML (10MCG/ML) PREMIX INFUSION
25.0000 ug/h | INTRAVENOUS | Status: DC
  Administered 2018-11-13: 15:00:00 50 ug/h via INTRAVENOUS
  Filled 2018-11-13: qty 250

## 2018-11-13 MED ORDER — AMIODARONE HCL IN DEXTROSE 360-4.14 MG/200ML-% IV SOLN
INTRAVENOUS | Status: AC
  Filled 2018-11-13: qty 200

## 2018-11-13 MED ORDER — CISATRACURIUM BOLUS VIA INFUSION
0.0500 mg/kg | INTRAVENOUS | Status: DC | PRN
  Filled 2018-11-13: qty 4

## 2018-11-13 MED ORDER — PROPOFOL 1000 MG/100ML IV EMUL
25.0000 ug/kg/min | INTRAVENOUS | Status: DC
  Administered 2018-11-13: 20:00:00 25 ug/kg/min via INTRAVENOUS
  Administered 2018-11-13: 23:00:00 50 ug/kg/min via INTRAVENOUS
  Administered 2018-11-14: 04:00:00 45 ug/kg/min via INTRAVENOUS
  Filled 2018-11-13 (×4): qty 100

## 2018-11-13 MED ORDER — MIDAZOLAM HCL 2 MG/2ML IJ SOLN
2.0000 mg | INTRAMUSCULAR | Status: DC | PRN
  Administered 2018-11-13: 16:00:00 2 mg via INTRAVENOUS
  Filled 2018-11-13 (×3): qty 2

## 2018-11-13 MED ORDER — ORAL CARE MOUTH RINSE
15.0000 mL | OROMUCOSAL | Status: DC
  Administered 2018-11-13 – 2018-11-17 (×40): 15 mL via OROMUCOSAL

## 2018-11-13 MED ORDER — INSULIN ASPART 100 UNIT/ML ~~LOC~~ SOLN
0.0000 [IU] | SUBCUTANEOUS | Status: DC
  Administered 2018-11-13: 20:00:00 3 [IU] via SUBCUTANEOUS
  Administered 2018-11-14 – 2018-11-17 (×4): 2 [IU] via SUBCUTANEOUS

## 2018-11-13 MED ORDER — ASPIRIN 325 MG PO TABS: 325.0000 mg | ORAL_TABLET | Freq: Once | ORAL | Status: DC

## 2018-11-13 MED ORDER — AMIODARONE HCL IN DEXTROSE 360-4.14 MG/200ML-% IV SOLN
30.0000 mg/h | INTRAVENOUS | Status: DC
  Administered 2018-11-13 – 2018-11-14 (×2): 30 mg/h via INTRAVENOUS
  Filled 2018-11-13: qty 200

## 2018-11-13 MED ORDER — HEPARIN SODIUM (PORCINE) 1000 UNIT/ML IJ SOLN
INTRAMUSCULAR | Status: AC
  Filled 2018-11-13: qty 1

## 2018-11-13 MED ORDER — FAMOTIDINE IN NACL 20-0.9 MG/50ML-% IV SOLN
20.0000 mg | Freq: Two times a day (BID) | INTRAVENOUS | Status: DC
  Administered 2018-11-13 – 2018-11-14 (×2): 20 mg via INTRAVENOUS
  Filled 2018-11-13 (×2): qty 50

## 2018-11-13 MED ORDER — CHLORHEXIDINE GLUCONATE 0.12% ORAL RINSE (MEDLINE KIT)
15.0000 mL | Freq: Two times a day (BID) | OROMUCOSAL | Status: DC
  Administered 2018-11-13 – 2018-11-17 (×8): 15 mL via OROMUCOSAL

## 2018-11-13 MED ORDER — FENTANYL BOLUS VIA INFUSION
50.0000 ug | INTRAVENOUS | Status: DC | PRN
  Filled 2018-11-13: qty 50

## 2018-11-13 MED ORDER — FENTANYL BOLUS VIA INFUSION
50.0000 ug | INTRAVENOUS | Status: DC | PRN
  Administered 2018-11-13: 50 ug via INTRAVENOUS
  Administered 2018-11-14: 100 ug via INTRAVENOUS
  Administered 2018-11-14 – 2018-11-16 (×7): 50 ug via INTRAVENOUS
  Filled 2018-11-13: qty 50

## 2018-11-13 MED ORDER — FENTANYL CITRATE (PF) 100 MCG/2ML IJ SOLN
100.0000 ug | Freq: Once | INTRAMUSCULAR | Status: AC
  Administered 2018-11-13: 17:00:00 100 ug via INTRAVENOUS

## 2018-11-13 MED ORDER — ASPIRIN EC 81 MG PO TBEC: 81.0000 mg | DELAYED_RELEASE_TABLET | Freq: Every day | ORAL | Status: DC

## 2018-11-13 MED ORDER — SODIUM CHLORIDE 0.9 % IV SOLN
INTRAVENOUS | Status: DC
  Administered 2018-11-13 – 2018-11-15 (×3): via INTRAVENOUS

## 2018-11-13 MED ORDER — NOREPINEPHRINE 4 MG/250ML-% IV SOLN
0.0000 ug/min | INTRAVENOUS | Status: DC
  Administered 2018-11-13: 21:00:00 5 ug/min via INTRAVENOUS
  Filled 2018-11-13: qty 250

## 2018-11-13 MED ORDER — CHLORHEXIDINE GLUCONATE CLOTH 2 % EX PADS
6.0000 | MEDICATED_PAD | Freq: Every day | CUTANEOUS | Status: DC
  Administered 2018-11-15 – 2018-11-16 (×2): 6 via TOPICAL

## 2018-11-13 MED ORDER — FENTANYL 2500MCG IN NS 250ML (10MCG/ML) PREMIX INFUSION
100.0000 ug/h | INTRAVENOUS | Status: DC
  Administered 2018-11-14: 02:00:00 200 ug/h via INTRAVENOUS
  Administered 2018-11-14: 300 ug/h via INTRAVENOUS
  Administered 2018-11-15: 04:00:00 100 ug/h via INTRAVENOUS
  Administered 2018-11-15: 21:00:00 150 ug/h via INTRAVENOUS
  Administered 2018-11-16: 15:00:00 100 ug/h via INTRAVENOUS
  Filled 2018-11-13 (×5): qty 250

## 2018-11-13 MED ORDER — ENOXAPARIN SODIUM 40 MG/0.4ML ~~LOC~~ SOLN
40.0000 mg | SUBCUTANEOUS | Status: DC
  Administered 2018-11-13: 19:00:00 40 mg via SUBCUTANEOUS
  Filled 2018-11-13: qty 0.4

## 2018-11-13 MED ORDER — SODIUM CHLORIDE 0.9 % IV BOLUS
1000.0000 mL | Freq: Once | INTRAVENOUS | Status: AC
  Administered 2018-11-13: 23:00:00 1000 mL via INTRAVENOUS

## 2018-11-13 MED ORDER — VERAPAMIL HCL 2.5 MG/ML IV SOLN
INTRAVENOUS | Status: AC
  Filled 2018-11-13: qty 2

## 2018-11-13 MED ORDER — ATORVASTATIN CALCIUM 10 MG PO TABS: 20.0000 mg | ORAL_TABLET | Freq: Every day | ORAL | Status: DC

## 2018-11-13 MED ORDER — SODIUM CHLORIDE 0.9% FLUSH: 10.0000 mL | INTRAVENOUS | Status: DC | PRN

## 2018-11-13 MED ORDER — HEPARIN (PORCINE) IN NACL 1000-0.9 UT/500ML-% IV SOLN
INTRAVENOUS | Status: AC
  Filled 2018-11-13: qty 1000

## 2018-11-13 MED ORDER — MAGNESIUM SULFATE 2 GM/50ML IV SOLN
2.0000 g | Freq: Once | INTRAVENOUS | Status: AC
  Administered 2018-11-13: 19:00:00 2 g via INTRAVENOUS
  Filled 2018-11-13: qty 50

## 2018-11-13 MED ORDER — POTASSIUM CHLORIDE 20 MEQ/15ML (10%) PO SOLN
40.0000 meq | Freq: Once | ORAL | Status: DC
  Filled 2018-11-13: qty 30

## 2018-11-13 MED ORDER — MIDAZOLAM HCL 2 MG/2ML IJ SOLN: 2.0000 mg | INTRAMUSCULAR | Status: DC | PRN

## 2018-11-13 MED ORDER — CHLORHEXIDINE GLUCONATE CLOTH 2 % EX PADS
6.0000 | MEDICATED_PAD | Freq: Every day | CUTANEOUS | Status: DC
  Administered 2018-11-13 – 2018-11-17 (×5): 6 via TOPICAL

## 2018-11-13 MED ORDER — SODIUM CHLORIDE 0.9% FLUSH
10.0000 mL | Freq: Two times a day (BID) | INTRAVENOUS | Status: DC
  Administered 2018-11-14 – 2018-11-16 (×5): 10 mL

## 2018-11-13 SURGICAL SUPPLY — 4 items
KIT HEART LEFT (KITS) ×4 IMPLANT
PACK CARDIAC CATHETERIZATION (CUSTOM PROCEDURE TRAY) ×4 IMPLANT
TRANSDUCER W/STOPCOCK (MISCELLANEOUS) ×4 IMPLANT
TUBING CIL FLEX 10 FLL-RA (TUBING) ×4 IMPLANT

## 2018-11-13 NOTE — ED Provider Notes (Signed)
[facility] EMERGENCY DEPARTMENT Provider Note   CSN: [id number] Arrival date & time: 11/25/18  1502    History   Chief Complaint Chief Complaint  Patient presents with   Post CPR    HPI Tony Reynolds is a 48 y.o. male.     Level 5 caveat due to intubation.  Patient with witnessed arrest.  Downtime about 6 to 7 minutes without CPR.  CPR was initiated by EMS, initial rhythm was ventricular fibrillation that was shocked.  Patient then had asystole, given epinephrine and then had return of spontaneous circulation.  Patient was intubated with a 7.0 ET tube.  Has had stable vital signs since.  According to EMS patient has a history of ventricular fibrillation.  Had defibrillator in the past.  Was supposed to be wearing a LifeVest but has not been wearing it.  The history is provided by the EMS personnel.  Cardiac Arrest Witnessed by:  Family member Time before ALS initiated:  5-8 minutes Condition upon EMS arrival:  Unresponsive Pulse:  Absent Initial cardiac rhythm per EMS:  Ventricular fibrillation Treatments prior to arrival:  ACLS protocol Medications given prior to ED:  Epinephrine Airway:  Intubation prior to arrival   Past Medical History:  Diagnosis Date   Arthritis    Back pain    Eczema    Hypertension    Lumbar disc herniation    MI (myocardial infarction) ([place]) 2018    Patient Active Problem List   Diagnosis Date Noted   Ventricular arrhythmia 10/23/2018   Current every day smoker 10/31/2018   History of MI (myocardial infarction) 44/07/4740   Chronic systolic CHF (congestive heart failure) ([place]) 11/14/2018   Uses LifeVest defibrillator intemittently and not consistently 11/05/2018   Syncope and collapse 11/11/2018   Sinus bradycardia 10/29/2018   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[place]) 11/09/2017   ICD (implantable cardioverter-defibrillator) infection ([place]) 11/09/2017   Dysphagia    ETOH abuse    Coronary artery  disease involving native coronary artery of native heart without angina pectoris    Tobacco abuse    Benign essential HTN    Agitation    Hypokalemia    Cardiac arrest ([place]) 05/19/2017   Status post bilateral hip replacements 12/14/2015   Avascular necrosis of bones of both hips ([place]) 07/04/2015   Avascular necrosis of left femoral head ([place]) 07/04/2015    Past Surgical History:  Procedure Laterality Date   BILATERAL ANTERIOR TOTAL HIP ARTHROPLASTY Bilateral 12/14/2015   Procedure: BILATERAL ANTERIOR TOTAL HIP ARTHROPLASTY;  Surgeon: Mcarthur Rossetti, MD;  Location: [facility];  Service: Orthopedics;  Laterality: Bilateral;   CORONARY/GRAFT ACUTE MI REVASCULARIZATION N/A 05/19/2017   Procedure: Coronary/Graft Acute MI Revascularization;  Surgeon: Sherren Mocha, MD;  Location: [place] [facility];  Service: Cardiovascular;  Laterality: N/A;   ICD GENERATOR REMOVAL N/A 11/09/2017   Procedure: ICD GENERATOR REMOVAL;  Surgeon: Deboraha Sprang, MD;  Location: [place] [facility];  Service: Cardiovascular;  Laterality: N/A;   LEFT HEART CATH AND CORONARY ANGIOGRAPHY N/A 05/19/2017   Procedure: LEFT HEART CATH AND CORONARY ANGIOGRAPHY;  Surgeon: Sherren Mocha, MD;  Location: [place] [facility];  Service: Cardiovascular;  Laterality: N/A;   NO PAST SURGERIES     POCKET REVISION/RELOCATION N/A 05/06/2018   Procedure: POCKET REVISION;  Surgeon: Evans Lance, MD;  Location: [place] [facility] LAB;  Service: Cardiovascular;  Laterality: N/A;   SUBQ ICD IMPLANT N/A 07/17/2017   Procedure: SUBQ ICD IMPLANT;  Surgeon: Caryl Comes,  Salvatore Decent, MD;  Location: [facility] INVASIVE CV LAB;  Service: Cardiovascular;  Laterality: N/A;        Home Medications    Prior to Admission medications   Medication Sig Start Date End Date Taking? Authorizing Provider  atorvastatin (LIPITOR) 20 MG tablet Take 1 tablet (20 mg total) by mouth daily at 6 PM. 06/15/17   Duke Salvia, MD  losartan (COZAAR) 25 MG tablet  Take 2 tablets (50 mg total) by mouth daily. Patient taking differently: Take 25 mg by mouth daily.  10/27/17   Duke Salvia, MD  sildenafil (VIAGRA) 100 MG tablet Take 100 mg by mouth See admin instructions. Take 1 hour before intercourse 01/22/18   [provider]  spironolactone (ALDACTONE) 25 MG tablet Take 25 mg by mouth daily.    [provider]  tadalafil (CIALIS) 20 MG tablet Take 10 mg by mouth as directed. Take 1/2 of a tablet with 1/2 of a sildenafil tablet an hour prior to intercourse as needed 11/03/18   [provider]  tamsulosin (FLOMAX) 0.4 MG CAPS capsule Take 0.4 mg by mouth daily.  03/01/18   [provider]    Family History No family history on file.  Social History Social History   Tobacco Use   Smoking status: Current Every Day Smoker    Packs/day: 0.50    Types: Cigarettes    Start date: 04/01/1989   Smokeless tobacco: Never Used   Tobacco comment: 10 ciggs per day  Substance Use Topics   Alcohol use: Not Currently    Alcohol/week: 0.0 standard drinks    Frequency: Never    Comment: Quit drinking in MAY   Drug use: No     Allergies   Tomato   Review of Systems Review of Systems  Unable to perform ROS: Acuity of condition     Physical Exam Updated Vital Signs  Normal vital signs.  Physical Exam Vitals signs and nursing note reviewed.  Constitutional:      General: He is not in acute distress.    Appearance: He is well-developed. He is not ill-appearing.  HENT:     Head: Normocephalic and atraumatic.     Mouth/Throat:     Mouth: Mucous membranes are moist.  Eyes:     Extraocular Movements: Extraocular movements intact.     Conjunctiva/sclera: Conjunctivae normal.     Pupils: Pupils are equal, round, and reactive to light.  Neck:     Musculoskeletal: Neck supple.  Cardiovascular:     Rate and Rhythm: Normal rate and regular rhythm.     Pulses: Normal pulses.     Heart sounds: Normal heart  sounds. No murmur.  Pulmonary:     Effort: No respiratory distress.     Breath sounds: Normal breath sounds.     Comments: intubated Abdominal:     Palpations: Abdomen is soft.     Tenderness: There is no abdominal tenderness.  Skin:    General: Skin is warm and dry.  Neurological:     Mental Status: He is alert.     GCS: GCS eye subscore is 1. GCS verbal subscore is 1. GCS motor subscore is 1.      ED Treatments / Results  Labs (all labs ordered are listed, but only abnormal results are displayed) Labs Reviewed  SARS CORONAVIRUS 2 (HOSPITAL ORDER, PERFORMED IN [place] [facility])  CBC WITH DIFFERENTIAL/PLATELET  COMPREHENSIVE METABOLIC PANEL  LIPASE, BLOOD  TROPONIN I (HIGH SENSITIVITY)  TROPONIN  I (HIGH SENSITIVITY)  MAGNESIUM  HIV ANTIBODY (ROUTINE TESTING W REFLEX)  HEMOGLOBIN A1C    EKG EKG Interpretation  Date/Time:  Saturday November 13 2018 15:06:32 EDT Ventricular Rate:  72 PR Interval:    QRS Duration: 98 QT Interval:  476 QTC Calculation: 521 R Axis:   84 Text Interpretation:  Sinus rhythm Biatrial enlargement LVH with secondary repolarization abnormality ST depr, consider ischemia, inferior leads Prolonged QT interval Confirmed by Virgina NorfolkAdam, Izaac Reisig [phone]([id number]) on 11/08/2018 3:10:16 PM   Radiology Dg Chest Portable 1 View  Result Date: 11/03/2018 CLINICAL DATA:  S/p VFib EXAM: PORTABLE CHEST 1 VIEW COMPARISON:  02/23/2019, 10:23 a.m. FINDINGS: Interval placement of endotracheal tube, tip below the thoracic inlet. Interval placement of esophagogastric tube, tip at the gastroesophageal junction and side port over the lower esophagus. Recommend advancement. Otherwise unchanged AP portable examination. No acute airspace opacity. IMPRESSION: 1. Interval placement of endotracheal tube, tip below the thoracic inlet. 2. Interval placement of esophagogastric tube, tip at the gastroesophageal junction and side port over the lower esophagus. Recommend advancement. 3.  Otherwise unchanged AP portable examination. No acute airspace opacity. Electronically Signed   By: Lauralyn PrimesAlex  Bibbey M.D.   On: 11/02/2018 15:34   Dg Chest Port 1 View  Result Date: 05-06-19 CLINICAL DATA:  Chest pain EXAM: PORTABLE CHEST 1 VIEW COMPARISON:  07/17/2017 FINDINGS: The heart size and mediastinal contours are within normal limits. Both lungs are clear. The visualized skeletal structures are unremarkable. IMPRESSION: No active disease. Electronically Signed   By: Signa Kellaylor  Stroud M.D.   On: 02/23/2019 10:35    Procedures .Critical Care Performed by: Virgina Norfolkuratolo, Afua Hoots, DO Authorized by: Virgina Norfolkuratolo, Treasure Ingrum, DO   Critical care provider statement:    Critical care time (minutes):  40   Critical care was necessary to treat or prevent imminent or life-threatening deterioration of the following conditions:  Cardiac failure   Critical care was time spent personally by me on the following activities:  Blood draw for specimens, development of treatment plan with patient or surrogate, discussions with consultants, discussions with primary provider, evaluation of patient's response to treatment, examination of patient, obtaining history from patient or surrogate, ordering and performing treatments and interventions, ordering and review of radiographic studies, ordering and review of laboratory studies, pulse oximetry, re-evaluation of patient's condition and review of old charts   I assumed direction of critical care for this patient from another provider in my specialty: no     (including critical care time)  Medications Ordered in ED Medications  fentaNYL 2500mcg in NS 250mL (2610mcg/ml) infusion-PREMIX (50 mcg/hr Intravenous New Bag/Given 11/12/2018 1520)  fentaNYL (SUBLIMAZE) bolus via infusion 50 mcg (has no administration in time range)  midazolam (VERSED) injection 2 mg (2 mg Intravenous Given 10/26/2018 1540)  midazolam (VERSED) injection 2 mg (has no administration in time range)  amiodarone  (NEXTERONE) 1.8 mg/mL load via infusion 150 mg (150 mg Intravenous Bolus from Bag 10/18/2018 1523)    Followed by  amiodarone (NEXTERONE PREMIX) 360-4.14 MG/200ML-% (1.8 mg/mL) IV infusion (60 mg/hr Intravenous New Bag/Given 11/11/2018 1523)    Followed by  amiodarone (NEXTERONE PREMIX) 360-4.14 MG/200ML-% (1.8 mg/mL) IV infusion (has no administration in time range)  enoxaparin (LOVENOX) injection 40 mg (has no administration in time range)  famotidine (PEPCID) IVPB 20 mg premix (has no administration in time range)  atorvastatin (LIPITOR) tablet 20 mg (has no administration in time range)  magnesium sulfate IVPB 2 g 50 mL (has no administration in time range)  aspirin  tablet 325 mg (has no administration in time range)  aspirin chewable tablet 81 mg (has no administration in time range)  insulin aspart (novoLOG) injection 0-15 Units (has no administration in time range)  fentaNYL (SUBLIMAZE) 100 MCG/2ML injection (  Given 11/08/2018 1515)  lidocaine (PF) (XYLOCAINE) 1 % injection (has no administration in time range)  Heparin (Porcine) in NaCl 1000-0.9 UT/500ML-% SOLN (has no administration in time range)  verapamil (ISOPTIN) 2.5 MG/ML injection (has no administration in time range)  heparin 1000 UNIT/ML injection (has no administration in time range)     Initial Impression / Assessment and Plan / ED Course  I have reviewed the triage vital signs and the nursing notes.  Pertinent labs & imaging results that were available during my care of the patient were reviewed by me and considered in my medical decision making (see chart for details).     Tony Reynolds is a 48 year old male with history of ventricular fibrillation, cardiomyopathy who presents to the ED after cardiac arrest.  Patient arrives with normal vitals.  Patient was intubated in the field with a 7 size 2 by EMS.  Witnessed arrest.  CPR was not initiated.  Downtime was about 5 to 7 minutes, EMS started CPR and initial rhythm was  ventricular fibrillation.  Patient was shocked, next rhythm check was asystole and patient was given epinephrine and then return of spontaneous circulation was achieved.  Patient arrives hemodynamically stable.  Amiodarone bolus and drip was ordered.  Code STEMI had been activated in the field.  Dr. Katrinka BlazingSmith with interventional cardiology reviewed and came down to the ED to evaluate the patient.  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  Patient had had a defibrillator in the past but was removed recently due to infection.  He was supposed to wearing a LifeVest which she had not been compliant with.  EMS rhythm so ventricular fibrillation.  Suspect that this is the cause of his arrest today.  Dr. Katrinka BlazingSmith does not recommend immediate catheterization at this time.  He does recommend cooling.  Labs were obtained.  Chest x-ray showed stable findings from yesterday as patient did leave AMA yesterday from the hospital for syncopal event which is likely from arrhythmia.  ET tube shows satisfactory placement.  Critical care was consulted and will admit the patient for further care.  Consulted cardiology for ICU team as well as they will need to make long-term plan for his ventricular fibrillation.  Cardiology is aware the patient.  Patient with lab work pending at time of handoff to ICU.  This chart was dictated using voice recognition software.  Despite best efforts to proofread,  errors can occur which can change the documentation meaning.    Final Clinical Impressions(s) / ED Diagnoses   Final diagnoses:  Ventricular fibrillation [facility](HCC)  Cardiac arrest [facility](HCC)    ED Discharge Orders    None       Virgina NorfolkCuratolo, Reiley Keisler, DO 11/09/2018 1606

## 2018-11-13 NOTE — H&P (Signed)
NAME:  Tony Reynolds, MRN:  [id number], DOB:  1970/06/03, LOS: 0 ADMISSION DATE:  10/19/2018, CONSULTATION DATE:  10/18/2018 REFERRING MD:  ER, CHIEF COMPLAINT:  Arrest   Brief History   48 year old man with mixed ischemic/nonischemic cardiomyopathy, prior Vfib arrest presenting with Vfib arrest.  History of present illness   48 year old smoker with history of Vfib arrest presenting with another Vfib arrest.  History per Dr. Corky SingHenry Ladrea Reynolds's note and confirmed with ER staff: "He has had prior cardiac arrest in December 2018.  Had nonobstructive coronary disease at that time with reduction in LV function.  Subsequently had AICD implantation in the subcutaneous space in January 2019.  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  Patient presented to emergency room yesterday with an episode of syncope but signed out AMA.  He had a "seizure" today with collapse.  CPR was not provided by bystanders, EMS arrived 6 to 7 minutes after collapse.  At least 6 minutes of CPR was provided with 2 epinephrine and defibrillation from ventricular fibrillation--> asystole--> SVT/A. Fib--> sinus rhythm."  Intubated in field.  Did not fully wake up but was moving extremities and now is sedated.  Intensivist service has been asked to admit.  Past Medical History  Nonischemic cardiomyopathy although has a pretty good LAD lesion I guess with TIMI 3 flow so thought noncontributory.  Smoker  Prior Vfib arrest  Prior AICD complicated by infection and explaintation  Significant Hospital Events   10/21/2018 Admission  Consults:  11/12/2018 Cardiology and hopefully EP  Procedures:  10/20/2018 Intubation in field  Significant Diagnostic Tests:  11/12/2018 CT Head:>>>   Micro Data:  None  Antimicrobials:  None   Interim history/subjective:  Admitted  Objective   Blood pressure 116/85, pulse 66, temperature (!) 96.6 F (35.9 C), resp. rate 18, SpO2 100 %.    Vent Mode: PRVC FiO2 (%):  [100 %] 100  % Set Rate:  [18 bmp] 18 bmp Vt Set:  [510 mL] 510 mL PEEP:  [5 cmH20] 5 cmH20 Plateau Pressure:  [15 cmH20] 15 cmH20  No intake or output data in the 24 hours ending 10/21/2018 1702 There were no vitals filed for this visit.  Examination: GEN: middle aged man in NAD HEENT: endotracheal tube in place with minimal secretions CV: irregular, ext warm PULM: clear, no wheezing or accessory muscle use GI: Soft, hypoactive BS EXT: IO in place, no edema NEURO: GCS E1V1TM1, no corneals, sluggish pupils, gag intact, decorticate posturing to endotracheal suctioning PSYCH: RASS 0 SKIN: No rashes   Resolved Hospital Problem list   None  Assessment & Plan:  Vfib arrest- ~15 minutes total code but 7 minutes of downtime with no CPR.  Recurrent issue for him.  In setting of AICD being taken out for infection and in setting of leaving AMA yesterday for syncope.  Poor initial neuro exam.  Initial head CT benign. - 33C cooling protocol with usual vent/sedation/paralytics as needed - Appreciate Cards/EP input, continue on amio drip for now - Hold home antihypertensives - ASA/statin - See no role for antimicrobials at present  Best practice:  Diet: NPO Pain/Anxiety/Delirium protocol (if indicated): hold and see if he wakes up VAP protocol (if indicated): ordered 6/27 DVT prophylaxis: lovenox GI prophylaxis: pepcid Glucose control: SSI q4h Mobility: bedrest Code Status: full Family Communication: will reach out Disposition: ICU  Labs   CBC: Recent Labs  Lab 10/20/2018 1009 11/07/2018 1513  WBC 6.7 8.0  NEUTROABS 4.4  3.9  HGB 15.2 15.1  HCT 44.7 44.4  MCV 85.3 85.9  PLT 205 240    Basic Metabolic Panel: Recent Labs  Lab 11/01/2018 1009  NA 142  K 3.9  CL 108  CO2 25  GLUCOSE 96  BUN 11  CREATININE 0.92  CALCIUM 9.5  MG 2.1   GFR: Estimated Creatinine Clearance: 99.1 mL/min (by C-G formula based on SCr of 0.92 mg/dL). Recent Labs  Lab 11/16/2018 1009 11/05/2018 1513  WBC 6.7  8.0    Liver Function Tests: Recent Labs  Lab 11/05/2018 1009  AST 35  ALT 27  ALKPHOS 89  BILITOT 0.5  PROT 8.0  ALBUMIN 4.5   No results for input(s): LIPASE, AMYLASE in the last 168 hours. No results for input(s): AMMONIA in the last 168 hours.  ABG    Component Value Date/Time   PHART 7.494 (H) 05/20/2017 0506   PCO2ART 27.4 (L) 05/20/2017 0506   PO2ART 221.0 (H) 05/20/2017 0506   HCO3 21.1 05/20/2017 0506   TCO2 22 05/20/2017 0506   ACIDBASEDEF 1.0 05/20/2017 0506   O2SAT 100.0 05/20/2017 0506     Coagulation Profile: No results for input(s): INR, PROTIME in the last 168 hours.  Cardiac Enzymes: No results for input(s): CKTOTAL, CKMB, CKMBINDEX, TROPONINI in the last 168 hours.  HbA1C: No results found for: HGBA1C  CBG: No results for input(s): GLUCAP in the last 168 hours.  Review of Systems:   Cannot assess- comatose.  Past Medical History  He,  has a past medical history of Arthritis, Back pain, Eczema, Hypertension, Lumbar disc herniation, and MI (myocardial infarction) (HCC) (2018).   Surgical History    Past Surgical History:  Procedure Laterality Date   BILATERAL ANTERIOR TOTAL HIP ARTHROPLASTY Bilateral 12/14/2015   Procedure: BILATERAL ANTERIOR TOTAL HIP ARTHROPLASTY;  Surgeon: Kathryne Hitch, MD;  Location: [facility];  Service: Orthopedics;  Laterality: Bilateral;   CORONARY/GRAFT ACUTE MI REVASCULARIZATION N/A 05/19/2017   Procedure: Coronary/Graft Acute MI Revascularization;  Surgeon: Tonny Bollman, MD;  Location: [facility] INVASIVE CV LAB;  Service: Cardiovascular;  Laterality: N/A;   ICD GENERATOR REMOVAL N/A 11/09/2017   Procedure: ICD GENERATOR REMOVAL;  Surgeon: Duke Salvia, MD;  Location: [facility] INVASIVE CV LAB;  Service: Cardiovascular;  Laterality: N/A;   LEFT HEART CATH AND CORONARY ANGIOGRAPHY N/A 05/19/2017   Procedure: LEFT HEART CATH AND CORONARY ANGIOGRAPHY;  Surgeon: Tonny Bollman, MD;  Location: [facility] INVASIVE CV LAB;  Service:  Cardiovascular;  Laterality: N/A;   NO PAST SURGERIES     POCKET REVISION/RELOCATION N/A 05/06/2018   Procedure: POCKET REVISION;  Surgeon: Marinus Maw, MD;  Location: [facility] INVASIVE CV LAB;  Service: Cardiovascular;  Laterality: N/A;   SUBQ ICD IMPLANT N/A 07/17/2017   Procedure: SUBQ ICD IMPLANT;  Surgeon: Duke Salvia, MD;  Location: [facility] INVASIVE CV LAB;  Service: Cardiovascular;  Laterality: N/A;     Social History   reports that he has been smoking cigarettes. He started smoking about 29 years ago. He has been smoking about 0.50 packs per day. He has never used smokeless tobacco. He reports previous alcohol use. He reports that he does not use drugs.   Family History   His family history is not on file.   Allergies Allergies  Allergen Reactions   Tomato Other (See Comments)    No acidic foods - eczema     Home Medications  Prior to Admission medications   Medication Sig Start Date End Date Taking? Authorizing Provider  atorvastatin (LIPITOR) 20 MG tablet Take 1 tablet (20 mg total) by mouth daily at 6 PM. 06/15/17   Deboraha Sprang, MD  losartan (COZAAR) 25 MG tablet Take 2 tablets (50 mg total) by mouth daily. Patient taking differently: Take 25 mg by mouth daily.  10/27/17   Deboraha Sprang, MD  sildenafil (VIAGRA) 100 MG tablet Take 100 mg by mouth See admin instructions. Take 1 hour before intercourse 01/22/18   [provider]  spironolactone (ALDACTONE) 25 MG tablet Take 25 mg by mouth daily.    [provider]  tadalafil (CIALIS) 20 MG tablet Take 10 mg by mouth as directed. Take 1/2 of a tablet with 1/2 of a sildenafil tablet an hour prior to intercourse as needed 11/03/18   [provider]  tamsulosin (FLOMAX) 0.4 MG CAPS capsule Take 0.4 mg by mouth daily.  03/01/18   [provider]     Critical care time: 34 minutes

## 2018-11-13 NOTE — Consult Note (Addendum)
Cardiology Consultation:   Patient ID: Tony Reynolds MRN: [id number]; DOB: Jul 14, 1970  Admit date: 02/04/19 Date of Consult: 02/04/19  Primary Care Provider: Benita StabileHall, John Z, MD Primary Cardiologist: Sherryl MangesSteven Klein, MD  Primary Electrophysiologist:  Lewayne BuntingGregg Taylor, MD    Patient Profile:   Tony Reynolds is a 48 y.o. male with a hx of out of hospital cardiac arrest due to ventricular fibrillation 2018, nonobstructive CAD with 50% LAD at cath, nonischemic cardiomyopathy, encephalopathy with recovery with prolonged hospital recovery, hypertension, subcu ICD implantation 07/2017, AICD explant June 2019, who is being seen today for the evaluation of out of hospital cardiac arrest at the request of emergency room in setting of STEMI activation.Marland Kitchen.  History of Present Illness:   Tony Reynolds the patient is 48 years of age and has a nonischemic cardiomyopathy.  He has had prior cardiac arrest in December 2018.  Had nonobstructive coronary disease at that time with reduction in LV function.  Subsequently had AICD implantation in the subcutaneous space in January 2019.  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  Patient presented to emergency room yesterday with an episode of syncope but signed out AMA.  He had a "seizure" today with collapse.  CPR was not provided by bystanders, EMS arrived 6 to 7 minutes after collapse.  At least 6 minutes of CPR was provided with 2 epinephrine and defibrillation from ventricular fibrillation--> asystole--> SVT/A. Fib--> sinus rhythm.  He was intubated in the field.  EKG post resuscitation does not reveal acute ST elevation or evidence of STEMI.  In review of yesterday's encounter in the emergency room, high-sensitivity troponin I did not suggest acute myocardial infarction.  He was seen yesterday because of an episode of syncope.  Apparently the patient has been given a LifeVest but has not been wearing it at home.  Risk factors: Tobacco abuse, ethanol  abuse, hypertension, stenting of his cardiac problems.  Heart Pathway Score:     Past Medical History:  Diagnosis Date  . Arthritis   . Back pain   . Eczema   . Hypertension   . Lumbar disc herniation   . MI (myocardial infarction) (HCC) 2018    Past Surgical History:  Procedure Laterality Date  . BILATERAL ANTERIOR TOTAL HIP ARTHROPLASTY Bilateral 12/14/2015   Procedure: BILATERAL ANTERIOR TOTAL HIP ARTHROPLASTY;  Surgeon: Kathryne Hitchhristopher Y Blackman, MD;  Location: [facility];  Service: Orthopedics;  Laterality: Bilateral;  . CORONARY/GRAFT ACUTE MI REVASCULARIZATION N/A 05/19/2017   Procedure: Coronary/Graft Acute MI Revascularization;  Surgeon: Tonny Bollmanooper, Michael, MD;  [facility];  Service: Cardiovascular;  Laterality: N/A;  . ICD GENERATOR REMOVAL N/A 11/09/2017   Procedure: ICD GENERATOR REMOVAL;  Surgeon: Duke SalviaKlein, Steven C, MD;  [facility];  Service: Cardiovascular;  Laterality: N/A;  . LEFT HEART CATH AND CORONARY ANGIOGRAPHY N/A 05/19/2017   Procedure: LEFT HEART CATH AND CORONARY ANGIOGRAPHY;  Surgeon: Tonny Bollmanooper, Michael, MD;  [facility];  Service: Cardiovascular;  Laterality: N/A;  . NO PAST SURGERIES    . POCKET REVISION/RELOCATION N/A 05/06/2018   Procedure: POCKET REVISION;  Surgeon: Marinus Mawaylor, Gregg W, MD;  [facility];  Service: Cardiovascular;  Laterality: N/A;  . SUBQ ICD IMPLANT N/A 07/17/2017   Procedure: SUBQ ICD IMPLANT;  Surgeon: Duke SalviaKlein, Steven C, MD;  [facility];  Service: Cardiovascular;  Laterality: N/A;     Home Medications:  Prior to Admission medications   Medication Sig Start Date End  Date Taking? Authorizing Provider  atorvastatin (LIPITOR) 20 MG tablet Take 1 tablet (20 mg total) by mouth daily at 6 PM. 06/15/17   Duke SalviaKlein, Steven C, MD  losartan (COZAAR) 25 MG tablet Take 2 tablets (50 mg total) by mouth daily. Patient taking differently: Take 25 mg by mouth daily.  10/27/17   Duke SalviaKlein, Steven C,  MD  sildenafil (VIAGRA) 100 MG tablet Take 100 mg by mouth See admin instructions. Take 1 hour before intercourse 01/22/18   [provider]  spironolactone (ALDACTONE) 25 MG tablet Take 25 mg by mouth daily.    [provider]  tadalafil (CIALIS) 20 MG tablet Take 10 mg by mouth as directed. Take 1/2 of a tablet with 1/2 of a sildenafil tablet an hour prior to intercourse as needed 11/03/18   [provider]  tamsulosin (FLOMAX) 0.4 MG CAPS capsule Take 0.4 mg by mouth daily.  03/01/18   [provider]    Inpatient Medications: Scheduled Meds: . [START ON 11/14/2018] aspirin  81 mg Oral Daily  . aspirin  325 mg Oral Once  . [START ON 11/14/2018] atorvastatin  20 mg Oral q1800  . enoxaparin (LOVENOX) injection  40 mg Subcutaneous Q24H  . insulin aspart  0-15 Units Subcutaneous Q4H   Continuous Infusions: . amiodarone 60 mg/hr (11/01/2018 1523)   Followed by  . amiodarone    . famotidine (PEPCID) IV    . fentaNYL infusion INTRAVENOUS 50 mcg/hr (11/05/2018 1520)  . magnesium sulfate bolus IVPB     PRN Meds: fentaNYL, midazolam, midazolam  Allergies:    Allergies  Allergen Reactions  . Tomato Other (See Comments)    No acidic foods - eczema    Social History:   Social History   Socioeconomic History  . Marital status: Married    Spouse name: Not on file  . Number of children: Not on file  . Years of education: Not on file  . Highest education level: Not on file  Occupational History  . Not on file  Social Needs  . Financial resource strain: Not on file  . Food insecurity    Worry: Not on file    Inability: Not on file  . Transportation needs    Medical: Not on file    Non-medical: Not on file  Tobacco Use  . Smoking status: Current Every Day Smoker    Packs/day: 0.50    Types: Cigarettes    Start date: 04/01/1989  . Smokeless tobacco: Never Used  . Tobacco comment: 10 ciggs per day  Substance and Sexual Activity  . Alcohol use: Not  Currently    Alcohol/week: 0.0 standard drinks    Frequency: Never    Comment: Quit drinking in MAY  . Drug use: No  . Sexual activity: Not on file  Lifestyle  . Physical activity    Days per week: Not on file    Minutes per session: Not on file  . Stress: Not on file  Relationships  . Social Musicianconnections    Talks on phone: Not on file    Gets together: Not on file    Attends religious service: Not on file    Active member of club or organization: Not on file    Attends meetings of clubs or organizations: Not on file    Relationship status: Not on file  . Intimate partner violence    Fear of current or ex partner: Not on file    Emotionally abused: Not on file  Physically abused: Not on file    Forced sexual activity: Not on file  Other Topics Concern  . Not on file  Social History Narrative  . Not on file    Family History:   No family history on file.  Unable to obtain family history from patient as he is intubated  ROS:  Please see the history of present illness.  Reviewed information from yesterday.  Had syncope yesterday.  Refused to wear LifeVest.  Signed out AMA. All other ROS reviewed and negative.     Physical Exam/Data:  There were no vitals filed for this visit. No intake or output data in the 24 hours ending 10/23/2018 1607 Last 3 Weights 10/21/2018 06/30/2018 06/02/2018  Weight (lbs) 170 lb 185 lb 183 lb  Weight (kg) 77.111 kg 83.915 kg 83.008 kg     There is no height or weight on file to calculate BMI.  General: African-American male.  Intubated.  Dyssynchronous respiratory pattern HEENT: Eyes are closed.  Pupils are small. Lymph: no adenopathy Neck: Unable to fully assess. Endocrine: Unable to assess. Vascular: No carotid bruits; FA pulses 2+ bilaterally without bruits  Cardiac: Heart sounds are distant.  Obvious murmur. Lungs: Ventilator respiratory sounds are heard. Abd: Distended.  No bowel sounds. Ext: no edema Musculoskeletal:  No obvious  deformities. Skin: warm and dry  Neuro:  CNs 2-12 intact, no focal abnormalities noted Psych:  Normal affect   EKG:  The EKG was personally reviewed and demonstrates: Sinus rhythm/bradycardia with prominent voltage consistent with LVH.  No acute ST-T wave changes noted.  When compared to the tracing from 10/19/2018, no acute ST-T wave changes noted and heart rate is faster. Telemetry:  Telemetry was personally reviewed and demonstrates: Sinus rhythm and sinus bradycardia postarrest.  Relevant CV Studies: 2D Doppler echocardiogram January 2019: ------------------------------------------------------------------- Study Conclusions  - Left ventricle: The cavity size was normal. There was moderate   concentric hypertrophy. Systolic function was severely reduced.   The estimated ejection fraction was in the range of 20% to 25%.   Diffuse hypokinesis. Features are consistent with a pseudonormal   left ventricular filling pattern, with concomitant abnormal   relaxation and increased filling pressure (grade 2 diastolic   dysfunction). Doppler parameters are consistent with elevated   ventricular end-diastolic filling pressure. - Aortic valve: There was no regurgitation. - Aortic root: The aortic root was normal in size. - Mitral valve: There was mild regurgitation. - Left atrium: The atrium was mildly dilated. - Right ventricle: Systolic function was normal. - Right atrium: The atrium was normal in size. - Tricuspid valve: There was mild regurgitation. - Pulmonic valve: There was no regurgitation. - Pulmonary arteries: Systolic pressure was mildly increased. PA   peak pressure: 39 mm Hg (S). - Inferior vena cava: The vessel was dilated. The respirophasic   diameter changes were blunted (< 50%), consistent with elevated   central venous pressure. - Pericardium, extracardiac: There was no pericardial effusion.  Impressions:  - When compared to the prior study from 08/02/2015 LVEF has    significantly decreased from 60-65% to 20-25% with diffuse   hypokinesis.  Laboratory Data:  High Sensitivity Troponin:   Recent Labs  Lab 11/14/2018 1009 11/16/2018 1201  TROPONINIHS 23.00* 29.00*     Cardiac EnzymesNo results for input(s): TROPONINI in the last 168 hours. No results for input(s): TROPIPOC in the last 168 hours.  Chemistry Recent Labs  Lab 11/11/2018 1009  NA 142  K 3.9  CL 108  CO2  25  GLUCOSE 96  BUN 11  CREATININE 0.92  CALCIUM 9.5  GFRNONAA >60  GFRAA >60  ANIONGAP 9    Recent Labs  Lab 10/30/2018 1009  PROT 8.0  ALBUMIN 4.5  AST 35  ALT 27  ALKPHOS 89  BILITOT 0.5   Hematology Recent Labs  Lab 10/18/2018 1009 10/23/2018 1513  WBC 6.7 8.0  RBC 5.24 5.17  HGB 15.2 15.1  HCT 44.7 44.4  MCV 85.3 85.9  MCH 29.0 29.2  MCHC 34.0 34.0  RDW 13.2 12.9  PLT 205 240   BNP Recent Labs  Lab 10/24/2018 1009  BNP 128.0*    DDimer No results for input(s): DDIMER in the last 168 hours.   Radiology/Studies:  Dg Chest Portable 1 View  Result Date: 10/24/2018 CLINICAL DATA:  S/p VFib EXAM: PORTABLE CHEST 1 VIEW COMPARISON:  10/18/2018, 10:23 a.m. FINDINGS: Interval placement of endotracheal tube, tip below the thoracic inlet. Interval placement of esophagogastric tube, tip at the gastroesophageal junction and side port over the lower esophagus. Recommend advancement. Otherwise unchanged AP portable examination. No acute airspace opacity. IMPRESSION: 1. Interval placement of endotracheal tube, tip below the thoracic inlet. 2. Interval placement of esophagogastric tube, tip at the gastroesophageal junction and side port over the lower esophagus. Recommend advancement. 3. Otherwise unchanged AP portable examination. No acute airspace opacity. Electronically Signed   By: Eddie Candle M.D.   On: 10/21/2018 15:34   Dg Chest Port 1 View  Result Date: 11/11/2018 CLINICAL DATA:  Chest pain EXAM: PORTABLE CHEST 1 VIEW COMPARISON:  07/17/2017 FINDINGS: The heart size  and mediastinal contours are within normal limits. Both lungs are clear. The visualized skeletal structures are unremarkable. IMPRESSION: No active disease. Electronically Signed   By: Kerby Moors M.D.   On: 11/02/2018 10:35    Assessment and Plan:   1. Ventricular fibrillation out of hospital with resuscitation.  This is a recurrent event having occurred in similar fashion May 18, 2017.  Downtime before return of spontaneous circulation at least 10 minutes.  Patient had been instructed to wear a LifeVest but was not compliant.  Was seen in the emergency room 1 day ago with an episode of syncope but signed out AMA.  Suspect that episode was related to ventricular arrhythmia as well.  Agree with IV amiodarone therapy.  Maintain potassium greater than 3.8.  Check magnesium level.  Would recommend hypothermia protocol given downtime.  This should be started in the emergency room as the patient will not be going emergently to the Cath Lab. 2. Nonischemic cardiomyopathy --> based upon cardiac catheterization performed in January 2019.  2D Doppler echocardiogram.  Coronary angiography later in hospital course if appropriate based on neurological status and other clinical indicators. 3. Chronic systolic heart failure without recent reassessment of LV systolic function.  Will need to have an echocardiogram performed. 4. AICD explantation due to infection greater than 1 year ago.  Patient has been noncompliant with LifeVest. 5. Nonobstructive coronary artery disease by coronary angiography May 19, 2017.  At that time 50% mid LAD with 75% diagonal.  STEMI activation has been canceled.  There is no evidence of ongoing or acute injury present on EKG.  Downtime greater than 10 minutes.  Appropriate course of action and absence of STEMI on EKG is delayed invasive strategy based on the recent COACT Trial(NEJM 2019).  Recommend lactate levels, assessment of neurological function, and consideration of invasive  strategy later in hospital course if appropriate.    Guarded  prognosis but did recover from similar event 18 months ago.  For questions or updates, please contact [facility] Please consult [email or web address] for contact info under     Signed, Lesleigh Noe, MD  November 23, 2018 4:07 PM

## 2018-11-13 NOTE — ED Notes (Signed)
Ice applied to pt groin and underarm

## 2018-11-13 NOTE — Progress Notes (Signed)
This chaplain responded to Pt. Tony Reynolds.  The chaplain understands from communication with the medical team the Pt. Wife-Shannon Tamburro at [phone]  will be updated as soon as possible.  The chaplain is available for F/U spiritual as needed.

## 2018-11-13 NOTE — Procedures (Signed)
Arterial Catheter Insertion Procedure Note Tony Reynolds [id number] Dec 23, 1970  Procedure: Insertion of Arterial Catheter  Indications: Blood pressure monitoring  Procedure Details Consent: Risks of procedure as well as the alternatives and risks of each were explained to the (patient/caregiver).  Consent for procedure obtained. Time Out: Verified patient identification, verified procedure, site/side was marked, verified correct patient position, special equipment/implants available, medications/allergies/relevent history reviewed, required imaging and test results available.  Performed  Maximum sterile technique was used including antiseptics, cap, gloves, hand hygiene and mask. Skin prep: Chlorhexidine; local anesthetic administered 20 gauge catheter was inserted into right radial artery using the Seldinger technique. ULTRASOUND GUIDANCE USED: YES Evaluation Blood flow good; BP tracing good. Complications: No apparent complications.   Candee Furbish 11/16/2018

## 2018-11-13 NOTE — ED Triage Notes (Signed)
Pt brought in by [facility]. EMS called out for seizures witnessed by family, 5-41min downtime before EMS arrival. Pt found to be in vfib, apneic, and pulseless. Pt shocked x1 at 120J, given x1 epi. Pt went in to asystole, x5 min CPR and x1 epi. Pt then in SVT. Per EMS HR maintained b/w 80-110, BP 142/102. Pt has hx of MI and defibrillator. Defib was removed x1 year ago but has not been replaced. Pt has 7.0 ETT and IO to right shin in place on arrival.

## 2018-11-13 NOTE — ED Notes (Signed)
Attempted to call report. Nurse unavailable and asked to call back in 46min

## 2018-11-13 NOTE — ED Notes (Signed)
ED TO INPATIENT HANDOFF REPORT  ED Nurse Name and Phone #: Susa RaringLisa RN [phone]  S Name/Age/Gender Tony MaserLeonard J Reynolds 48 y.o. male Room/Bed: RESUSC/RESUSC  Code Status   Code Status: Full Code  Home/SNF/Other Home Patient oriented to: situation Is this baseline? No      Chief Complaint Stemi  Triage Note No notes on file   Allergies Allergies  Allergen Reactions  . Tomato Other (See Comments)    No acidic foods - eczema    Level of Care/Admitting Diagnosis ED Disposition    ED Disposition Condition Comment   Admit  [facility] [[id number]]  Level of Care: ICU [6]  Covid Evaluation: Confirmed COVID Negative  Diagnosis: Cardiac arrest (HCC) [427.5.ICD-9-CM]  Admitting Physician: Lorin GlassSMITH, DANIEL C [[id number]]  Attending Physician: Lorin GlassSMITH, DANIEL C [[id number]]  Estimated length of stay: 5 - 7 days  Certification:: I certify this patient will need inpatient services for at least 2 midnights  PT Class (Do Not Modify): Inpatient [101]  PT Acc Code (Do Not Modify): Private [1]       B Medical/Surgery History Past Medical History:  Diagnosis Date  . Arthritis   . Back pain   . Eczema   . Hypertension   . Lumbar disc herniation   . MI (myocardial infarction) (HCC) 2018   Past Surgical History:  Procedure Laterality Date  . BILATERAL ANTERIOR TOTAL HIP ARTHROPLASTY Bilateral 12/14/2015   Procedure: BILATERAL ANTERIOR TOTAL HIP ARTHROPLASTY;  Surgeon: Kathryne Hitchhristopher Y Blackman, MD;  Location: [facility];  Service: Orthopedics;  Laterality: Bilateral;  . CORONARY/GRAFT ACUTE MI REVASCULARIZATION N/A 05/19/2017   Procedure: Coronary/Graft Acute MI Revascularization;  Surgeon: Tonny Bollmanooper, Michael, MD;  [facility];  Service: Cardiovascular;  Laterality: N/A;  . ICD GENERATOR REMOVAL N/A 11/09/2017   Procedure: ICD GENERATOR REMOVAL;  Surgeon: Duke SalviaKlein, Steven C, MD;  [facility];  Service: Cardiovascular;  Laterality: N/A;  . LEFT  HEART CATH AND CORONARY ANGIOGRAPHY N/A 05/19/2017   Procedure: LEFT HEART CATH AND CORONARY ANGIOGRAPHY;  Surgeon: Tonny Bollmanooper, Michael, MD;  [facility];  Service: Cardiovascular;  Laterality: N/A;  . NO PAST SURGERIES    . POCKET REVISION/RELOCATION N/A 05/06/2018   Procedure: POCKET REVISION;  Surgeon: Marinus Mawaylor, Gregg W, MD;  [facility];  Service: Cardiovascular;  Laterality: N/A;  . SUBQ ICD IMPLANT N/A 07/17/2017   Procedure: SUBQ ICD IMPLANT;  Surgeon: Duke SalviaKlein, Steven C, MD;  [facility];  Service: Cardiovascular;  Laterality: N/A;     A IV Location/Drains/Wounds Patient Lines/Drains/Airways Status   Active Line/Drains/Airways    Name:   Placement date:   Placement time:   Site:   Days:   Peripheral IV 10/21/2018 Left Hand   10/24/2018    1119    Hand   1   Peripheral IV 11-05-18 Right Wrist   11-05-18    1539    Wrist   less than 1   NG/OG Tube Orogastric 18 Fr. Right mouth Xray;Aucultation   11-05-18    1520    Right mouth   less than 1   Airway 7 mm   11-05-18    -     less than 1   Incision (Closed) 11/09/17 Chest Left   11/09/17    2000     369          Intake/Output Last 24 hours No intake or output data in the 24 hours ending  11/02/2018 1603  Labs/Imaging Results for orders placed or performed during the hospital encounter of 11/09/2018 (from the past 48 hour(s))  CBC with Differential     Status: None   Collection Time: 10/18/2018  3:13 PM  Result Value Ref Range   WBC 8.0 4.0 - 10.5 K/uL   RBC 5.17 4.22 - 5.81 MIL/uL   Hemoglobin 15.1 13.0 - 17.0 g/dL   HCT 44.4 39.0 - 52.0 %   MCV 85.9 80.0 - 100.0 fL   MCH 29.2 26.0 - 34.0 pg   MCHC 34.0 30.0 - 36.0 g/dL   RDW 12.9 11.5 - 15.5 %   Platelets 240 150 - 400 K/uL   nRBC 0.0 0.0 - 0.2 %   Neutrophils Relative % 47 %   Neutro Abs 3.9 1.7 - 7.7 K/uL   Lymphocytes Relative 46 %   Lymphs Abs 3.7 0.7 - 4.0 K/uL   Monocytes Relative 3 %   Monocytes Absolute 0.2 0.1 - 1.0 K/uL    Eosinophils Relative 2 %   Eosinophils Absolute 0.2 0.0 - 0.5 K/uL   Basophils Relative 1 %   Basophils Absolute 0.1 0.0 - 0.1 K/uL   Immature Granulocytes 1 %   Abs Immature Granulocytes 0.04 0.00 - 0.07 K/uL    Comment: Performed at [facility], [address], [place], [place] [address]   Dg Chest Portable 1 View  Result Date: 11/08/2018 CLINICAL DATA:  S/p VFib EXAM: PORTABLE CHEST 1 VIEW COMPARISON:  10/29/2018, 10:23 a.m. FINDINGS: Interval placement of endotracheal tube, tip below the thoracic inlet. Interval placement of esophagogastric tube, tip at the gastroesophageal junction and side port over the lower esophagus. Recommend advancement. Otherwise unchanged AP portable examination. No acute airspace opacity. IMPRESSION: 1. Interval placement of endotracheal tube, tip below the thoracic inlet. 2. Interval placement of esophagogastric tube, tip at the gastroesophageal junction and side port over the lower esophagus. Recommend advancement. 3. Otherwise unchanged AP portable examination. No acute airspace opacity. Electronically Signed   By: Eddie Candle M.D.   On: 11/07/2018 15:34   Dg Chest Port 1 View  Result Date: 11/03/2018 CLINICAL DATA:  Chest pain EXAM: PORTABLE CHEST 1 VIEW COMPARISON:  07/17/2017 FINDINGS: The heart size and mediastinal contours are within normal limits. Both lungs are clear. The visualized skeletal structures are unremarkable. IMPRESSION: No active disease. Electronically Signed   By: Kerby Moors M.D.   On: 10/28/2018 10:35    Pending Labs Unresulted Labs (From admission, onward)    Start     Ordered   11/14/18 4270  Basic metabolic panel  Daily,   R     11/10/2018 1549   11/14/18 0500  Magnesium  Daily,   R     11/08/2018 1549   11/14/18 0500  CBC  Daily,   R     11/07/2018 1549   10/22/2018 1548  HIV antibody (Routine Testing)  Once,   STAT     11/04/2018 1549   11/02/2018 1511  Magnesium  ONCE - STAT,   STAT     11/11/2018 1510   10/28/2018 1509   Comprehensive metabolic panel  ONCE - STAT,   STAT     11/04/2018 1509   10/23/2018 1509  SARS Coronavirus 2 (CEPHEID - Performed in [place] hospital lab), [place]  (Asymptomatic Patients Labs)  Once,   STAT    Question:  Rule Out  Answer:  Yes   11/07/2018 1509   10/31/2018 1509  Lipase, blood  ONCE - STAT,   STAT     11/07/2018 1509   11/10/2018 1509  Troponin I (High Sensitivity)  STAT Now then every 2 hours,   R (with STAT occurrences)    Question:  Indication  Answer:  Suspect ACS   11/16/2018 1509          Vitals/Pain There were no vitals filed for this visit.  Isolation Precautions No active isolations  Medications Medications  fentaNYL in NS (56mcg/ml) infusion-PREMIX (50 mcg/hr Intravenous New Bag/Given 10/25/2018 1520)  fentaNYL (SUBLIMAZE) bolus via infusion 50 mcg (has no administration in time range)  midazolam (VERSED) injection 2 mg (2 mg Intravenous Given 10/21/2018 1540)  midazolam (VERSED) injection 2 mg (has no administration in time range)  amiodarone (NEXTERONE) 1.8 mg/mL load via infusion 150 mg (150 mg Intravenous Bolus from Bag 11/16/2018 1523)    Followed by  amiodarone (NEXTERONE PREMIX) 360-4.14 MG/200ML-% (1.8 mg/mL) IV infusion (60 mg/hr Intravenous New Bag/Given 10/27/2018 1523)    Followed by  amiodarone (NEXTERONE PREMIX) 360-4.14 MG/200ML-% (1.8 mg/mL) IV infusion (has no administration in time range)  enoxaparin (LOVENOX) injection 40 mg (has no administration in time range)  famotidine (PEPCID) IVPB 20 mg premix (has no administration in time range)  atorvastatin (LIPITOR) tablet 20 mg (has no administration in time range)  magnesium sulfate IVPB 2 g 50 mL (has no administration in time range)  aspirin tablet 325 mg (has no administration in time range)  aspirin chewable tablet 81 mg (has no administration in time range)  fentaNYL (SUBLIMAZE) 100 MCG/2ML injection (  Given 10/27/2018 1515)  lidocaine (PF) (XYLOCAINE) 1 % injection (has no  administration in time range)  Heparin (Porcine) in NaCl 1000-0.9 UT/500ML-% SOLN (has no administration in time range)  verapamil (ISOPTIN) 2.5 MG/ML injection (has no administration in time range)  heparin 1000 UNIT/ML injection (has no administration in time range)    Mobility non-ambulatory     Focused Assessments Cardiac Assessment Handoff:    Lab Results  Component Value Date   TROPONINI 25.35 (HH) 05/19/2017   Lab Results  Component Value Date   DDIMER 0.33 03/01/2017   Does the Patient currently have chest pain? No     R Recommendations: See Admitting Provider Note  Report given to:   Additional Notes:

## 2018-11-13 NOTE — ED Notes (Signed)
Pt transported to CT with RN and RT

## 2018-11-13 NOTE — ED Notes (Signed)
Please call Brigido Mera (wife) at [phone] to update her on patients condition.

## 2018-11-13 NOTE — Progress Notes (Signed)
Two RT attempted aline with doppler because pulse was faint. After no success, heard nothing on doppler. No blood in return. MD at bedside notified.

## 2018-11-13 NOTE — Procedures (Signed)
Central Venous Catheter Insertion Procedure Note Tony Reynolds [id number] [birth year]  Procedure: Insertion of Central Venous Catheter Indications: Drug and/or fluid administration  Procedure Details Consent: Risks of procedure as well as the alternatives and risks of each were explained to the (patient/caregiver).  Consent for procedure obtained. Time Out: Verified patient identification, verified procedure, site/side was marked, verified correct patient position, special equipment/implants available, medications/allergies/relevent history reviewed, required imaging and test results available.  Performed  Maximum sterile technique was used including antiseptics, cap, gloves, gown, hand hygiene, mask and sheet. Skin prep: Chlorhexidine; local anesthetic administered A antimicrobial bonded/coated triple lumen catheter was placed in the right internal jugular vein using the Seldinger technique.  Evaluation Blood flow good Complications: No apparent complications Patient did tolerate procedure well. Chest X-ray ordered to verify placement.  CXR: pending.  Tony Reynolds Tony Reynolds 11/04/2018, 6:19 PM

## 2018-11-13 NOTE — Progress Notes (Signed)
This chaplain responded to Pt. Code Stemi in [place]. The chaplain understands no spiritual care needs at this time. The RN stated will call the chaplain for F/U if needed.

## 2018-11-13 NOTE — Progress Notes (Signed)
CRITICAL VALUE ALERT  Critical Value:  Troponin 1567  Date & Time Notied:  11/15/2018.1948  Provider Notified: Elink  Orders Received/Actions taken: no new orders

## 2018-11-14 ENCOUNTER — Other Ambulatory Visit: Payer: Self-pay

## 2018-11-14 ENCOUNTER — Inpatient Hospital Stay (HOSPITAL_COMMUNITY): Payer: BC Managed Care – PPO

## 2018-11-14 ENCOUNTER — Encounter (HOSPITAL_COMMUNITY): Payer: Self-pay

## 2018-11-14 DIAGNOSIS — I420 Dilated cardiomyopathy: Secondary | ICD-10-CM

## 2018-11-14 DIAGNOSIS — R001 Bradycardia, unspecified: Secondary | ICD-10-CM

## 2018-11-14 DIAGNOSIS — R579 Shock, unspecified: Secondary | ICD-10-CM

## 2018-11-14 DIAGNOSIS — I5022 Chronic systolic (congestive) heart failure: Secondary | ICD-10-CM

## 2018-11-14 DIAGNOSIS — I499 Cardiac arrhythmia, unspecified: Secondary | ICD-10-CM

## 2018-11-14 DIAGNOSIS — F1010 Alcohol abuse, uncomplicated: Secondary | ICD-10-CM

## 2018-11-14 LAB — BASIC METABOLIC PANEL
Anion gap: 10 (ref 5–15)
BUN: 22 mg/dL — ABNORMAL HIGH (ref 6–20)
CO2: 16 mmol/L — ABNORMAL LOW (ref 22–32)
Calcium: 8 mg/dL — ABNORMAL LOW (ref 8.9–10.3)
Chloride: 115 mmol/L — ABNORMAL HIGH (ref 98–111)
Creatinine, Ser: 1 mg/dL (ref 0.61–1.24)
GFR calc Af Amer: >60 mL/min (ref >60)
GFR calc non Af Amer: >60 mL/min (ref >60)
Glucose, Bld: 127 mg/dL — ABNORMAL HIGH (ref 70–99)
Potassium: 3.8 mmol/L (ref 3.5–5.1)
Sodium: 141 mmol/L (ref 135–145)

## 2018-11-14 LAB — POCT I-STAT 4, (NA,K, GLUC, HGB,HCT)
Glucose, Bld: 89 mg/dL (ref 70–99)
Glucose, Bld: 64 mg/dL — ABNORMAL LOW (ref 70–99)
Glucose, Bld: 82 mg/dL (ref 70–99)
Glucose, Bld: 80 mg/dL (ref 70–99)
Glucose, Bld: 86 mg/dL (ref 70–99)
Glucose, Bld: 77 mg/dL (ref 70–99)
Glucose, Bld: 76 mg/dL (ref 70–99)
HCT: 39 % (ref 39.0–52.0)
HCT: 38 % — ABNORMAL LOW (ref 39.0–52.0)
HCT: 39 % (ref 39.0–52.0)
HCT: 39 % (ref 39.0–52.0)
HCT: 41 % (ref 39.0–52.0)
HCT: 45 % (ref 39.0–52.0)
HCT: 42 % (ref 39.0–52.0)
Hemoglobin: 13.3 g/dL (ref 13.0–17.0)
Hemoglobin: 12.9 g/dL — ABNORMAL LOW (ref 13.0–17.0)
Hemoglobin: 13.3 g/dL (ref 13.0–17.0)
Hemoglobin: 13.3 g/dL (ref 13.0–17.0)
Hemoglobin: 13.9 g/dL (ref 13.0–17.0)
Hemoglobin: 15.3 g/dL (ref 13.0–17.0)
Hemoglobin: 14.3 g/dL (ref 13.0–17.0)
Potassium: 3.2 mmol/L — ABNORMAL LOW (ref 3.5–5.1)
Potassium: 3.3 mmol/L — ABNORMAL LOW (ref 3.5–5.1)
Potassium: 3.8 mmol/L (ref 3.5–5.1)
Potassium: 3.8 mmol/L (ref 3.5–5.1)
Potassium: 3.9 mmol/L (ref 3.5–5.1)
Potassium: 3.9 mmol/L (ref 3.5–5.1)
Potassium: 4.1 mmol/L (ref 3.5–5.1)
Sodium: 146 mmol/L — ABNORMAL HIGH (ref 135–145)
Sodium: 146 mmol/L — ABNORMAL HIGH (ref 135–145)
Sodium: 147 mmol/L — ABNORMAL HIGH (ref 135–145)
Sodium: 146 mmol/L — ABNORMAL HIGH (ref 135–145)
Sodium: 146 mmol/L — ABNORMAL HIGH (ref 135–145)
Sodium: 146 mmol/L — ABNORMAL HIGH (ref 135–145)
Sodium: 146 mmol/L — ABNORMAL HIGH (ref 135–145)

## 2018-11-14 LAB — POCT I-STAT, CHEM 8
BUN: 22 mg/dL — ABNORMAL HIGH (ref 6–20)
BUN: 23 mg/dL — ABNORMAL HIGH (ref 6–20)
BUN: 21 mg/dL — ABNORMAL HIGH (ref 6–20)
Calcium, Ion: 1.18 mmol/L (ref 1.15–1.40)
Calcium, Ion: 1.15 mmol/L (ref 1.15–1.40)
Calcium, Ion: 1.12 mmol/L — ABNORMAL LOW (ref 1.15–1.40)
Chloride: 113 mmol/L — ABNORMAL HIGH (ref 98–111)
Chloride: 115 mmol/L — ABNORMAL HIGH (ref 98–111)
Chloride: 116 mmol/L — ABNORMAL HIGH (ref 98–111)
Creatinine, Ser: 0.9 mg/dL (ref 0.61–1.24)
Creatinine, Ser: 0.9 mg/dL (ref 0.61–1.24)
Creatinine, Ser: 0.8 mg/dL (ref 0.61–1.24)
Glucose, Bld: 177 mg/dL — ABNORMAL HIGH (ref 70–99)
Glucose, Bld: 120 mg/dL — ABNORMAL HIGH (ref 70–99)
Glucose, Bld: 111 mg/dL — ABNORMAL HIGH (ref 70–99)
HCT: 44 % (ref 39.0–52.0)
HCT: 42 % (ref 39.0–52.0)
HCT: 40 % (ref 39.0–52.0)
Hemoglobin: 15 g/dL (ref 13.0–17.0)
Hemoglobin: 14.3 g/dL (ref 13.0–17.0)
Hemoglobin: 13.6 g/dL (ref 13.0–17.0)
Potassium: 3 mmol/L — ABNORMAL LOW (ref 3.5–5.1)
Potassium: 3.5 mmol/L (ref 3.5–5.1)
Potassium: 3.5 mmol/L (ref 3.5–5.1)
Sodium: 144 mmol/L (ref 135–145)
Sodium: 144 mmol/L (ref 135–145)
Sodium: 143 mmol/L (ref 135–145)
TCO2: 19 mmol/L — ABNORMAL LOW (ref 22–32)
TCO2: 18 mmol/L — ABNORMAL LOW (ref 22–32)
TCO2: 18 mmol/L — ABNORMAL LOW (ref 22–32)

## 2018-11-14 LAB — POCT I-STAT 7, (LYTES, BLD GAS, ICA,H+H)
Acid-base deficit: 9 mmol/L — ABNORMAL HIGH (ref 0.0–2.0)
Bicarbonate: 16 mmol/L — ABNORMAL LOW (ref 20.0–28.0)
Calcium, Ion: 1.16 mmol/L (ref 1.15–1.40)
HCT: 43 % (ref 39.0–52.0)
Hemoglobin: 14.6 g/dL (ref 13.0–17.0)
O2 Saturation: 99 %
Patient temperature: 32.4
Potassium: 3.8 mmol/L (ref 3.5–5.1)
Sodium: 143 mmol/L (ref 135–145)
TCO2: 17 mmol/L — ABNORMAL LOW (ref 22–32)
pCO2 arterial: 25.6 mmHg — ABNORMAL LOW (ref 32.0–48.0)
pH, Arterial: 7.381 (ref 7.350–7.450)
pO2, Arterial: 153 mmHg — ABNORMAL HIGH (ref 83.0–108.0)

## 2018-11-14 LAB — CBC
HCT: 41.8 % (ref 39.0–52.0)
Hemoglobin: 14.5 g/dL (ref 13.0–17.0)
MCH: 29.2 pg (ref 26.0–34.0)
MCHC: 34.7 g/dL (ref 30.0–36.0)
MCV: 84.1 fL (ref 80.0–100.0)
Platelets: 218 10*3/uL (ref 150–400)
RBC: 4.97 MIL/uL (ref 4.22–5.81)
RDW: 13 % (ref 11.5–15.5)
WBC: 11.1 10*3/uL — ABNORMAL HIGH (ref 4.0–10.5)
nRBC: 0 % (ref 0.0–0.2)

## 2018-11-14 LAB — GLUCOSE, CAPILLARY
Glucose-Capillary: 100 mg/dL — ABNORMAL HIGH (ref 70–99)
Glucose-Capillary: 104 mg/dL — ABNORMAL HIGH (ref 70–99)
Glucose-Capillary: 130 mg/dL — ABNORMAL HIGH (ref 70–99)
Glucose-Capillary: 69 mg/dL — ABNORMAL LOW (ref 70–99)
Glucose-Capillary: 82 mg/dL (ref 70–99)
Glucose-Capillary: 91 mg/dL (ref 70–99)
Glucose-Capillary: 94 mg/dL (ref 70–99)
Glucose-Capillary: 95 mg/dL (ref 70–99)

## 2018-11-14 LAB — ECHOCARDIOGRAM COMPLETE: Weight: 2825.42 oz

## 2018-11-14 LAB — APTT: aPTT: 33 seconds (ref 24–36)

## 2018-11-14 LAB — HIV ANTIBODY (ROUTINE TESTING W REFLEX): HIV Screen 4th Generation wRfx: NONREACTIVE

## 2018-11-14 LAB — PROTIME-INR
INR: 1 (ref 0.8–1.2)
Prothrombin Time: 13 seconds (ref 11.4–15.2)

## 2018-11-14 LAB — MAGNESIUM: Magnesium: 2.5 mg/dL — ABNORMAL HIGH (ref 1.7–2.4)

## 2018-11-14 MED ORDER — NOREPINEPHRINE 16 MG/250ML-% IV SOLN
0.0000 ug/min | INTRAVENOUS | Status: DC
  Administered 2018-11-14: 15 ug/min via INTRAVENOUS
  Filled 2018-11-14: qty 250

## 2018-11-14 MED ORDER — MIDAZOLAM HCL 2 MG/2ML IJ SOLN
1.0000 mg | INTRAMUSCULAR | Status: DC | PRN
  Administered 2018-11-15: 1 mg via INTRAVENOUS
  Filled 2018-11-14: qty 2

## 2018-11-14 MED ORDER — DEXTROSE 50 % IV SOLN
12.5000 g | INTRAVENOUS | Status: AC
  Administered 2018-11-14: 23:00:00 12.5 g via INTRAVENOUS
  Filled 2018-11-14: qty 50

## 2018-11-14 MED ORDER — DEXTROSE 50 % IV SOLN
INTRAVENOUS | Status: AC
  Administered 2018-11-14: 12:00:00 12.5 g via INTRAVENOUS
  Filled 2018-11-14: qty 50

## 2018-11-14 MED ORDER — DEXTROSE 50 % IV SOLN
12.5000 g | INTRAVENOUS | Status: AC
  Administered 2018-11-14: 12.5 g via INTRAVENOUS

## 2018-11-14 MED ORDER — FAMOTIDINE 40 MG/5ML PO SUSR
20.0000 mg | Freq: Two times a day (BID) | ORAL | Status: DC
  Administered 2018-11-14 – 2018-11-17 (×6): 20 mg
  Filled 2018-11-14 (×6): qty 2.5

## 2018-11-14 MED ORDER — ASPIRIN 81 MG PO CHEW
81.0000 mg | CHEWABLE_TABLET | Freq: Every day | ORAL | Status: DC
  Administered 2018-11-15 – 2018-11-17 (×3): 81 mg
  Filled 2018-11-14 (×3): qty 1

## 2018-11-14 MED ORDER — MIDAZOLAM BOLUS VIA INFUSION
1.0000 mg | INTRAVENOUS | Status: DC | PRN
  Administered 2018-11-14: 4 mg via INTRAVENOUS
  Administered 2018-11-14 – 2018-11-15 (×2): 2 mg via INTRAVENOUS
  Filled 2018-11-14: qty 2

## 2018-11-14 MED ORDER — MIDAZOLAM HCL 2 MG/2ML IJ SOLN
1.0000 mg | INTRAMUSCULAR | Status: AC | PRN
  Administered 2018-11-14 – 2018-11-15 (×3): 1 mg via INTRAVENOUS
  Filled 2018-11-14: qty 2

## 2018-11-14 MED ORDER — ACETAMINOPHEN 500 MG PO TABS
500.0000 mg | ORAL_TABLET | Freq: Three times a day (TID) | ORAL | Status: AC | PRN
  Administered 2018-11-14 – 2018-11-15 (×2): 500 mg via NASOGASTRIC
  Filled 2018-11-14 (×2): qty 1

## 2018-11-14 MED ORDER — NOREPINEPHRINE 4 MG/250ML-% IV SOLN: 0.0000 ug/min | INTRAVENOUS | Status: DC

## 2018-11-14 MED ORDER — HYDRALAZINE HCL 20 MG/ML IJ SOLN
10.0000 mg | Freq: Four times a day (QID) | INTRAMUSCULAR | Status: DC | PRN
  Administered 2018-11-15 (×2): 10 mg via INTRAVENOUS
  Filled 2018-11-14 (×2): qty 1

## 2018-11-14 MED ORDER — MIDAZOLAM 50MG/50ML (1MG/ML) PREMIX INFUSION
0.0000 mg/h | INTRAVENOUS | Status: DC
  Administered 2018-11-14: 6 mg/h via INTRAVENOUS
  Administered 2018-11-14: 10:00:00 3 mg/h via INTRAVENOUS
  Administered 2018-11-14: 2 mg/h via INTRAVENOUS
  Administered 2018-11-15 – 2018-11-16 (×2): 4 mg/h via INTRAVENOUS
  Administered 2018-11-17: 6 mg/h via INTRAVENOUS
  Filled 2018-11-14 (×6): qty 50

## 2018-11-14 MED ORDER — ROCURONIUM BROMIDE 50 MG/5ML IV SOLN
50.0000 mg | Freq: Once | INTRAVENOUS | Status: DC
  Filled 2018-11-14: qty 5

## 2018-11-14 MED ORDER — ATORVASTATIN CALCIUM 10 MG PO TABS
20.0000 mg | ORAL_TABLET | Freq: Every day | ORAL | Status: DC
  Administered 2018-11-14 – 2018-11-16 (×3): 20 mg
  Filled 2018-11-14 (×3): qty 2

## 2018-11-14 MED ORDER — HEPARIN SODIUM (PORCINE) 5000 UNIT/ML IJ SOLN
5000.0000 [IU] | Freq: Three times a day (TID) | INTRAMUSCULAR | Status: DC
  Administered 2018-11-14 – 2018-11-17 (×10): 5000 [IU] via SUBCUTANEOUS
  Filled 2018-11-14 (×10): qty 1

## 2018-11-14 MED ORDER — SODIUM CHLORIDE 0.9 % IV SOLN
INTRAVENOUS | Status: DC
  Administered 2018-11-15: 04:00:00 via INTRAVENOUS

## 2018-11-14 NOTE — Progress Notes (Signed)
NAME:  Tony Reynolds, MRN:  [id number], DOB:  1971-04-20, LOS: 1 ADMISSION DATE:  10/18/2018, CONSULTATION DATE:  10/23/2018 REFERRING MD:  ER, CHIEF COMPLAINT:  Arrest   Brief History   48 year old man with mixed ischemic/nonischemic cardiomyopathy, prior Vfib arrest presenting with Vfib arrest.  History of present illness   48 year old smoker with history of Vfib arrest presenting with another Vfib arrest.  History per Dr. Corky SingHenry Smith's note and confirmed with ER staff: "He has had prior cardiac arrest in December 2018.  Had nonobstructive coronary disease at that time with reduction in LV function.  Subsequently had AICD implantation in the subcutaneous space in January 2019.  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  Patient presented to emergency room yesterday with an episode of syncope but signed out AMA.  He had a "seizure" today with collapse.  CPR was not provided by bystanders, EMS arrived 6 to 7 minutes after collapse.  At least 6 minutes of CPR was provided with 2 epinephrine and defibrillation from ventricular fibrillation--> asystole--> SVT/A. Fib--> sinus rhythm."  Intubated in field.  Did not fully wake up but was moving extremities and now is sedated. Intensivist service has been asked to admit.  Past Medical History  Nonischemic cardiomyopathy although has a pretty good LAD lesion I guess with TIMI 3 flow so thought noncontributory.  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 rate 18, weight 80.1 kg, SpO2 100 %. CVP:  [4 mmHg-12 mmHg] 12 mmHg  Vent Mode: PRVC FiO2 (%):  [40 %-100 %] 40 % Set Rate:  [18 bmp] 18 bmp Vt Set:  [510 mL] 510 mL PEEP:  [5 cmH20] 5 cmH20 Plateau Pressure:  [15 cmH20-21 cmH20] 19 cmH20   Intake/Output Summary (Last 24 hours) at 11/14/2018 0841 Last data filed at 11/14/2018 0800 Gross per 24 hour  Intake 3596.8 ml  Output 1190 ml  Net 2406.8 ml   Filed Weights   11/14/18 0500  Weight: 80.1 kg    Examination: General appearance: 48 y.o., male, intubated on mechanical life support, targeted temperature management pads in place Eyes: anicteric sclerae, pupils not responsive, patient on paralysis HENT: NCAT; oropharynx, MMM endotracheal tube in place Neck: Trachea midline; no JVD Lungs: Bilateral ventilated breath sounds CV: Bradycardic, regular, S1-S2 Abdomen: Soft, non-tender; non-distended, BS present  Extremities: No edema, radial DP present but faint and somewhat difficult to palpate Skin: Skin cool to touch Psych: Sedated on mechanical ventilation, paralyzed Neuro: Sedated on mechanical ventilation, paralyzed on Nimbex  Resolved Hospital Problem list   None  Assessment & Plan:   Ventricular fibrillation, status post cardioversion, approximately 15 minutes of code time, 7 minutes downtime no CPR. Per history this is a recurrent issue, recurrent VF arrest.  He had AICD in the past taken out for infection.  Patient left AMA yesterday from the ER.  Noncompliance. History of nonischemic cardiomyopathy Acute on chronic systolic heart failure, prior cardiac catheterization January 2019 of nonobstructive coronary disease, 50% mid LAD, 75% diagonal - Patient  initially started on 33 C cooling protocol developed progressive hypotension bradycardia overnight.  Patient switched to 36C this morning. -Continue aspirin statin. -we appreciate cardiology input - In conjunction with switching from 33 to 36 C, we will  also be able to stop his continuous paralysis.  We will also be switching from propofol to continuous Versed.  I believe these changes will help rectify his hypotension as well as bradycardia.  Hypotension, bradycardia, shock -Likely related to therapeutic hypothermia -We will wean norepinephrine  Hyperchloremia Likely from fluid resuscitation yesterday. -We will continue to observe  Elevated troponin Likely demand ischemia from cardiac arrest -We will continue to observe  Lactic acidosis, anion gap metabolic acidosis -Secondary to above, improving continue to follow.  Best practice:  Diet: NPO Pain/Anxiety/Delirium protocol (if indicated): hold and see if he wakes up VAP protocol (if indicated): ordered 6/27 DVT prophylaxis: lovenox GI prophylaxis: pepcid Glucose control: SSI q4h Mobility: bedrest Code Status: full Family Communication: will reach out Disposition: ICU  Labs   CBC: Recent Labs  Lab 10/25/2018 1009 11/05/2018 1513  11/10/2018 2212 11/05/2018 2257 11/14/18 0111 11/14/18 0533 11/14/18 0541 11/14/18 0834  WBC 6.7 8.0  --  12.3*  --   --  11.1*  --   --   NEUTROABS 4.4 3.9  --   --   --   --   --   --   --   HGB 15.2 15.1   < > 15.5 15.0 14.3 14.5 14.6 13.6  HCT 44.7 44.4   < > 44.5 44.0 42.0 41.8 43.0 40.0  MCV 85.3 85.9  --  84.4  --   --  84.1  --   --   PLT 205 240  --  238  --   --  218  --   --    < > = values in this interval not displayed.    Basic Metabolic Panel: Recent Labs  Lab November 13, 2018 1009 11/03/2018 1513  10/28/2018 2111 10/24/2018 2257 11/14/18 0111 11/14/18 0533 11/14/18 0541 11/14/18 0834  NA 142 139   < > 143 144 144 141 143 143  K 3.9 3.2*   < > 2.5* 3.0* 3.5 3.8 3.8 3.5  CL 108 107   < > 111 113* 115* 115*  --  116*  CO2 25 15*  --   --   --   --  16*  --   --   GLUCOSE 96 241*   < > 242* 177* 120* 127*  --  111*  BUN 11 13   < > 22* 22* 23* 22*  --  21*  CREATININE 0.92 1.73*   < > 0.80 0.90 0.90 1.00  --  0.80  CALCIUM 9.5 8.8*   --   --   --   --  8.0*  --   --   MG 2.1 2.0  --   --   --   --  2.5*  --   --    < > = values in this interval not displayed.   GFR: Estimated Creatinine Clearance: 115.9 mL/min (by C-G formula based on SCr of 0.8 mg/dL). Recent Labs  Lab 11/01/2018 1009 10/19/2018 1513 11/04/2018 1632 10/20/2018 1850 10/22/2018 2212 11/14/18 0533  WBC 6.7 8.0  --   --  12.3* 11.1*  LATICACIDVEN  --   --  3.3* 3.0*  --   --     Liver Function Tests: Recent Labs  Lab Nov 13, 2018 1009 11/04/2018 1513  AST  35 142*  ALT 27 117*  ALKPHOS 89 116  BILITOT 0.5 0.5  PROT 8.0 6.6  ALBUMIN 4.5 3.7   Recent Labs  Lab 11/07/2018 1513  LIPASE 42   No results for input(s): AMMONIA in the last 168 hours.  ABG    Component Value Date/Time   PHART 7.381 11/14/2018 0541   PCO2ART 25.6 (L) 11/14/2018 0541   PO2ART 153.0 (H) 11/14/2018 0541   HCO3 16.0 (L) 11/14/2018 0541   TCO2 18 (L) 11/14/2018 0834   ACIDBASEDEF 9.0 (H) 11/14/2018 0541   O2SAT 99.0 11/14/2018 0541     Coagulation Profile: Recent Labs  Lab 10/18/2018 1850 11/14/18 0221  INR 1.0 1.0    Cardiac Enzymes: No results for input(s): CKTOTAL, CKMB, CKMBINDEX, TROPONINI in the last 168 hours.  HbA1C: Hgb A1c MFr Bld  Date/Time Value Ref Range Status  10/27/2018 04:32 PM 5.7 (H) 4.8 - 5.6 % Final    Comment:    REPEATED TO VERIFY (NOTE) Pre diabetes:          5.7%-6.4% Diabetes:              >6.4% Glycemic control for   <7.0% adults with diabetes     CBG: Recent Labs  Lab 11/04/2018 2218 11/14/18 0002 11/14/18 0224 11/14/18 0256 11/14/18 0401  GLUCAP 182* 130* 100* 104* 91    Review of Systems:   Cannot assess- comatose.  Past Medical History  He,  has a past medical history of Arthritis, Back pain, Eczema, Hypertension, Lumbar disc herniation, and MI (myocardial infarction) ([place]) (2018).   Surgical History    Past Surgical History:  Procedure Laterality Date  . BILATERAL ANTERIOR TOTAL HIP ARTHROPLASTY Bilateral  12/14/2015   Procedure: BILATERAL ANTERIOR TOTAL HIP ARTHROPLASTY;  Surgeon: Mcarthur Rossetti, MD;  Location: [facility];  Service: Orthopedics;  Laterality: Bilateral;  . CORONARY/GRAFT ACUTE MI REVASCULARIZATION N/A 05/19/2017   Procedure: Coronary/Graft Acute MI Revascularization;  Surgeon: Sherren Mocha, MD;  Location: [place] [facility] LAB;  Service: Cardiovascular;  Laterality: N/A;  . ICD GENERATOR REMOVAL N/A 11/09/2017   Procedure: ICD GENERATOR REMOVAL;  Surgeon: Deboraha Sprang, MD;  Location: [place] [facility];  Service: Cardiovascular;  Laterality: N/A;  . LEFT HEART CATH AND CORONARY ANGIOGRAPHY N/A 05/19/2017   Procedure: LEFT HEART CATH AND CORONARY ANGIOGRAPHY;  Surgeon: Sherren Mocha, MD;  Location: [place] [facility] LAB;  Service: Cardiovascular;  Laterality: N/A;  . NO PAST SURGERIES    . POCKET REVISION/RELOCATION N/A 05/06/2018   Procedure: POCKET REVISION;  Surgeon: Evans Lance, MD;  Location: [place] CV LAB;  Service: Cardiovascular;  Laterality: N/A;  . SUBQ ICD IMPLANT N/A 07/17/2017   Procedure: SUBQ ICD IMPLANT;  Surgeon: Deboraha Sprang, MD;  Location: [place] [facility];  Service: Cardiovascular;  Laterality: N/A;     Social History   reports that he has been smoking cigarettes. He started smoking about 29 years ago. He has been smoking about 0.50 packs per day. He has never used smokeless tobacco. He reports previous alcohol use. He reports that he does not use drugs.   Family History   His family history is not on file.   Allergies Allergies  Allergen Reactions  . Tomato Other (See Comments)    No acidic foods - eczema     Home Medications  Prior to Admission medications   Medication Sig Start Date End Date Taking? Authorizing Provider  atorvastatin (LIPITOR) 20 MG tablet Take 1 tablet (20  mg total) by mouth daily at 6 PM. 06/15/17   Duke SalviaKlein, Steven C, MD  losartan (COZAAR) 25 MG tablet Take 2 tablets (50 mg total) by mouth daily. Patient taking  differently: Take 25 mg by mouth daily.  10/27/17   Duke SalviaKlein, Steven C, MD  sildenafil (VIAGRA) 100 MG tablet Take 100 mg by mouth See admin instructions. Take 1 hour before intercourse 01/22/18   [provider]  spironolactone (ALDACTONE) 25 MG tablet Take 25 mg by mouth daily.    [provider]  tadalafil (CIALIS) 20 MG tablet Take 10 mg by mouth as directed. Take 1/2 of a tablet with 1/2 of a sildenafil tablet an hour prior to intercourse as needed 11/03/18   [provider]  tamsulosin (FLOMAX) 0.4 MG CAPS capsule Take 0.4 mg by mouth daily.  03/01/18   [provider]      This patient is critically ill with multiple organ system failure; which, requires frequent high complexity decision making, assessment, support, evaluation, and titration of therapies. This was completed through the application of advanced monitoring technologies and extensive interpretation of multiple databases. During this encounter critical care time was devoted to patient care services described in this note for 48 minutes.    Josephine IgoBradley L Icard, DO [facility] 11/14/2018 8:41 AM  Personal pager: [phone] If unanswered, please page CCM On-call: #[phone]

## 2018-11-14 NOTE — Progress Notes (Signed)
[place] Progress Note Patient Name: Tony Reynolds DOB: May 02, 1971 MRN: [id number]   Date of Service  11/14/2018  HPI/Events of Note  Asking for tylenol for comfort while re warming for V fib arrest. Temp 37.1  eICU Interventions  Tylenol 500 mg prn ordered.      Intervention Category Intermediate Interventions: Other:  Elmer Sow 11/14/2018, 11:10 PM

## 2018-11-14 NOTE — Progress Notes (Signed)
Patient ID: Tony Reynolds, male   DOB: [birth year], 48 y.o.   MRN: [id number]     PCP-Cardiologist: Virl Axe, MD   Subjective:    Patient is sedated and cooled.   He was cooled to 33 degrees and became bradycardic into the 30s.  Norepinephrine 14 was started to maintain MAP.  He has had no further arrhythmias.    Objective:   Weight Range: 80.1 kg Body mass index is 28.5 kg/m.   Vital Signs:   Temp:  [90.1 F (32.3 C)-96.6 F (35.9 C)] 91.4 F (33 C) (06/28 0800) Pulse Rate:  [29-85] 34 (06/28 0815) Resp:  [17-26] 18 (06/28 0815) BP: (92-165)/(68-119) 128/85 (06/28 0815) SpO2:  [98 %-100 %] 100 % (06/28 0815) Arterial Line BP: (88-172)/(45-103) 125/60 (06/28 0815) FiO2 (%):  [40 %-100 %] 40 % (06/28 0800) Weight:  [80.1 kg] 80.1 kg (06/28 0500) Last BM Date: (PTA)  Weight change: Filed Weights   11/14/18 0500  Weight: 80.1 kg    Intake/Output:   Intake/Output Summary (Last 24 hours) at 11/14/2018 0842 Last data filed at 11/14/2018 0800 Gross per 24 hour  Intake 3596.8 ml  Output 1190 ml  Net 2406.8 ml      Physical Exam    General:  Intubated/sedated.  HEENT: Normal Neck: Supple. JVP not elevated. Carotids 2+ bilat; no bruits. No lymphadenopathy or thyromegaly appreciated. Cor: PMI nondisplaced. Bradycardic, regular rate & rhythm. No rubs, gallops or murmurs. Lungs: Clear Abdomen: Soft, nondistended. No hepatosplenomegaly. No bruits or masses. Good bowel sounds. Extremities: No cyanosis, clubbing, rash, edema Neuro: Sedated   Telemetry   Sinus brady upper 30s (personally reviewed)  EKG    Sinus brady, LVH with repolarization abnormality, QTc 531  Labs    CBC Recent Labs    11/11/2018 1009 11/10/2018 1513  11/16/2018 2212  11/14/18 0533 11/14/18 0541 11/14/18 0834  WBC 6.7 8.0  --  12.3*  --  11.1*  --   --   NEUTROABS 4.4 3.9  --   --   --   --   --   --   HGB 15.2 15.1   < > 15.5   < > 14.5 14.6 13.6  HCT 44.7 44.4   < > 44.5   < > 41.8  43.0 40.0  MCV 85.3 85.9  --  84.4  --  84.1  --   --   PLT 205 240  --  238  --  218  --   --    < > = values in this interval not displayed.   Basic Metabolic Panel Recent Labs    11/01/2018 1513  11/14/18 0533 11/14/18 0541 11/14/18 0834  NA 139   < > 141 143 143  K 3.2*   < > 3.8 3.8 3.5  CL 107   < > 115*  --  116*  CO2 15*  --  16*  --   --   GLUCOSE 241*   < > 127*  --  111*  BUN 13   < > 22*  --  21*  CREATININE 1.73*   < > 1.00  --  0.80  CALCIUM 8.8*  --  8.0*  --   --   MG 2.0  --  2.5*  --   --    < > = values in this interval not displayed.   Liver Function Tests Recent Labs    10/28/2018 1009 11/11/2018 1513  AST 35 142*  ALT 27 117*  ALKPHOS 89 116  BILITOT 0.5 0.5  PROT 8.0 6.6  ALBUMIN 4.5 3.7   Recent Labs    11/06/2018 1513  LIPASE 42   Cardiac Enzymes No results for input(s): CKTOTAL, CKMB, CKMBINDEX, TROPONINI in the last 72 hours.  BNP: BNP (last 3 results) Recent Labs    11/07/2018 1009  BNP 128.0*    ProBNP (last 3 results) No results for input(s): PROBNP in the last 8760 hours.   D-Dimer No results for input(s): DDIMER in the last 72 hours. Hemoglobin A1C Recent Labs    11/08/2018 1632  HGBA1C 5.7*   Fasting Lipid Panel No results for input(s): CHOL, HDL, LDLCALC, TRIG, CHOLHDL, LDLDIRECT in the last 72 hours. Thyroid Function Tests No results for input(s): TSH, T4TOTAL, T3FREE, THYROIDAB in the last 72 hours.  Invalid input(s): FREET3  Other results:   Imaging    Ct Head Wo Contrast  Result Date: 10/23/2018 CLINICAL DATA:  Seizure activity, STEMI, altered level of consciousness, history hypertension EXAM: CT HEAD WITHOUT CONTRAST TECHNIQUE: Contiguous axial images were obtained from the base of the skull through the vertex without intravenous contrast. Sagittal and coronal MPR images reconstructed from axial data set. COMPARISON:  None FINDINGS: Brain: Normal ventricular morphology. No midline shift or mass effect. Normal  appearance of brain parenchyma. No intracranial hemorrhage, mass lesion, evidence of acute infarction, or extra-axial fluid collection. Vascular: No hyperdense vessels Skull: Intact Sinuses/Orbits: Clear Other: N/A IMPRESSION: No acute intracranial abnormalities. Electronically Signed   By: Lavonia Dana M.D.   On: 11/04/2018 16:06   Dg Chest Port 1 View  Result Date: 11/14/2018 CLINICAL DATA:  Cardiac arrest EXAM: PORTABLE CHEST 1 VIEW COMPARISON:  Chest x-rays dated 10/29/2018 and 11/11/2018 FINDINGS: Endotracheal tube is adequately positioned with tip at the level of the clavicles. Enteric tube passes below the diaphragm. RIGHT IJ central line remains adequately positioned with tip at the level of the mid/lower SVC. Pacer pads overlie the LEFT heart. Heart size and mediastinal contours are stable. Lungs are clear. No pleural effusion or pneumothorax seen. IMPRESSION: 1. Support apparatus appears stable in position. 2. Lungs are clear.  No evidence of pneumonia or pulmonary edema. Electronically Signed   By: Franki Cabot M.D.   On: 11/14/2018 08:04   Dg Chest Port 1 View  Result Date: 10/28/2018 CLINICAL DATA:  Central line placement EXAM: PORTABLE CHEST 1 VIEW COMPARISON:  Portable exam 1820 hours compared to 1523 hours FINDINGS: Tip of endotracheal tube projects 5.5 cm above carina. Nasogastric tube tip projects over a distal esophagus, recommend advancing tube 15 cm to place proximal side-port within stomach. RIGHT jugular central venous catheter with tip projecting over SVC. External pacing leads noted. Normal heart size, mediastinal contours, and pulmonary vascularity. Lungs clear. No infiltrate, pleural effusion, or pneumothorax. IMPRESSION: No pneumothorax following RIGHT jugular line placement. Recommend advancing nasogastric tube 15 cm to place proximal side-port within stomach. Electronically Signed   By: Lavonia Dana M.D.   On: 11/14/2018 18:48   Dg Chest Portable 1 View  Result Date:  11/02/2018 CLINICAL DATA:  S/p VFib EXAM: PORTABLE CHEST 1 VIEW COMPARISON:  11/11/2018, 10:23 a.m. FINDINGS: Interval placement of endotracheal tube, tip below the thoracic inlet. Interval placement of esophagogastric tube, tip at the gastroesophageal junction and side port over the lower esophagus. Recommend advancement. Otherwise unchanged AP portable examination. No acute airspace opacity. IMPRESSION: 1. Interval placement of endotracheal tube, tip below the thoracic inlet. 2. Interval placement of esophagogastric tube, tip at the gastroesophageal  junction and side port over the lower esophagus. Recommend advancement. 3. Otherwise unchanged AP portable examination. No acute airspace opacity. Electronically Signed   By: Eddie Candle M.D.   On: 10/25/2018 15:34   Dg Abd Portable 1v  Result Date: 11/14/2018 CLINICAL DATA:  OG tube placement EXAM: PORTABLE ABDOMEN - 1 VIEW COMPARISON:  None. FINDINGS: The OG tube coils within the gastric body with the tip terminating over the gastric antrum/pylorus. The visualized bowel gas pattern is nonspecific. There may be a few dilated loops of small bowel in the upper abdomen. IMPRESSION: 1. Enteric tube tip projects over the gastric antrum/pylorus. The tip is pointed distally. 2. There appear to be a few mildly dilated loops of small bowel in the upper abdomen. Electronically Signed   By: Constance Holster M.D.   On: 11/14/2018 02:01   Dg Abd Portable 1v  Result Date: 11/11/2018 CLINICAL DATA:  48 year old male status post enteric tube placement. EXAM: PORTABLE ABDOMEN - 1 VIEW COMPARISON:  None. FINDINGS: Partially visualized enteric tube with side-port just distal to the gastroesophageal junction and tip in the proximal gastric body. Recommend further advancing the tube by approximately 5 cm for optimal positioning. Air noted within the colon. IMPRESSION: Enteric tube with tip in the proximal gastric body. Electronically Signed   By: Anner Crete M.D.   On:  11/07/2018 21:26      Medications:     Scheduled Medications: . artificial tears  1 application Both Eyes M3N  . aspirin  81 mg Oral Daily  . atorvastatin  20 mg Oral q1800  . chlorhexidine gluconate (MEDLINE KIT)  15 mL Mouth Rinse BID  . Chlorhexidine Gluconate Cloth  6 each Topical Daily  . Chlorhexidine Gluconate Cloth  6 each Topical Daily  . heparin  5,000 Units Subcutaneous Q8H  . insulin aspart  0-15 Units Subcutaneous Q4H  . mouth rinse  15 mL Mouth Rinse 10 times per day  . potassium chloride  40 mEq Per Tube Once  . sodium chloride flush  10-40 mL Intracatheter Q12H     Infusions: . sodium chloride 75 mL/hr at 11/14/18 0800  . sodium chloride    . cisatracurium (NIMBEX) infusion 1.5 mcg/kg/min (11/14/18 0800)  . famotidine (PEPCID) IV Stopped (10/25/2018 2159)  . fentaNYL infusion INTRAVENOUS 200 mcg/hr (11/14/18 0800)  . norepinephrine (LEVOPHED) Adult infusion 14 mcg/min (11/14/18 0800)  . propofol (DIPRIVAN) infusion 50 mcg/kg/min (11/14/18 0800)     PRN Medications:  [COMPLETED] cisatracurium **AND** cisatracurium (NIMBEX) infusion **AND** cisatracurium, fentaNYL, midazolam, midazolam, sodium chloride flush    Assessment/Plan   1. Cardiac arrest: Ventricular fibrillation.  Had presented with syncope the day prior to admission but left AMA (likely had an episode then as well).  He was not compliant with Lifevest at home.  He has a history of VF/VT, had subcutaneous ICD initially placed in 1/19 after cardiac arrest.  This was removed in 6/19 due to pocket infection and he was placed on Lifevest.  He had ongoing infection at site that had to be debrided in 12/19.  Per Dr. Tanna Furry last note, plan was to re-implant ICD around 5/20 but he did not followup.  Currently cooled post-arrest.  - With bradycardia, warm up to 36 degrees (discussed with Dr. Valeta Harms).  Baseline HR around 70 before cooling.  - If he has neurologic recovery, will need cath (though doubt ischemia  as cause of event, has history of nonischemic CMP).  - Amiodarone stopped with bradycardia.  - If he  recovers, will need EP involvement to help with ICD plan.  2. Bradycardia: Sinus brady in 30s currently, maintaining MAP with norepinephrine 14.  Suspect bradycardia is due to hypothermia, HR was in 70s prior to cooling.  - Rewarm to 36 degrees for today, leave at that temperature as long as HR rises.  3. 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  He then had a cardiac MRI in 2/19 that showed mild LVH, EF recovered to 55%, normal RV => difficult to obtain TI on LGE images.  LVH thought to be due to HTN but cannot fully rule out HCM.   - He needs repeat echo, ideally with HR a bit higher.  4. CAD: Cath in 1/19 with 50% mLAD, 70% diagonal.  Nonobstructive disease, did not explain cardiomyopathy.  With arrest, TnI up to 2776.  Most likely related to VF with hypoperfusion and shocks, but cannot fully rule out ACS.  No STEMI.  - Continue ASA 81 and statin.  - If he has neurologic recovery with rewarming, will need cath.   CRITICAL CARE Performed by: Loralie Champagne  Total critical care time: 35 minutes  Critical care time was exclusive of separately billable procedures and treating other patients.  Critical care was necessary to treat or prevent imminent or life-threatening deterioration.  Critical care was time spent personally by me on the following activities: development of treatment plan with patient and/or surrogate as well as nursing, discussions with consultants, evaluation of patient's response to treatment, examination of patient, obtaining history from patient or surrogate, ordering and performing treatments and interventions, ordering and review of laboratory studies, ordering and review of radiographic studies, pulse oximetry and re-evaluation of  patient's condition.   Length of Stay: 1  Loralie Champagne, MD  11/14/2018, 8:42 AM  Advanced Heart Failure Team Pager [phone] (M-F; 7a - 4p)  Please contact [facility] for night-coverage after hours (4p -7a ) and weekends on [email or web address]

## 2018-11-14 NOTE — Progress Notes (Signed)
Dr. Valeta Harms at bedside to assess pt. MD updated on pt condition overnight. R/t bradycardia and hypotension requiring levophed, MD ordering to change TTM status from 33 degrees to 36 degrees. Kaylee RN at bedside for verification of change to [employer]. Pt to be warmed to 36 degrees and to maintain until original 33 degree goal time of 2043 tonight. Pt to then be warmed to 37 degrees per protocol. MD also discontinuing propofol gtt and starting versed gtt.

## 2018-11-14 NOTE — Progress Notes (Signed)
Pt biting ETT/tongue. RT called to bedside to place bite block, unable. Dr. Valeta Harms on unit, ordered 145mcg fentanyl bolus and 4mg  versed bolus at this time.

## 2018-11-14 NOTE — Plan of Care (Signed)

## 2018-11-14 NOTE — Progress Notes (Signed)
Bite block in place per RT, rocuronium not needed

## 2018-11-14 NOTE — Progress Notes (Signed)
Patient HR decreased to 27, symptomatic, MAP in low 60's. ELink and Cardiology notified. Verbal order to stop amio gtt.

## 2018-11-14 NOTE — Progress Notes (Signed)
Bite block inserted on the left side.

## 2018-11-14 NOTE — Progress Notes (Signed)
EEG complete - results pending 

## 2018-11-14 NOTE — Progress Notes (Signed)
eLink Physician-Brief Progress Note Patient Name: Tony Reynolds DOB: 01/11/1971 MRN: [id number]   Date of Service  11/14/2018  HPI/Events of Note  Re warming phase. BP 187/96.   eICU Interventions  Hydralazine prn ordered IV.      Intervention Category Intermediate Interventions: Hypertension - evaluation and management  Elmer Sow 11/14/2018, 11:43 PM

## 2018-11-14 NOTE — Procedures (Signed)
Echo attempted. EEG with patient. Will attempt again later.

## 2018-11-14 NOTE — Progress Notes (Signed)
  Echocardiogram 2D Echocardiogram has been performed.  Tony Reynolds 11/14/2018, 2:54 PM

## 2018-11-14 NOTE — Progress Notes (Signed)
Pt continues to bite down on ETT. Dr. Valeta Harms at bedside, gave orders for 50mg  rocuronium x1 dose now if needed to allow for placement of bite block.

## 2018-11-14 NOTE — Progress Notes (Addendum)
Pt. Began rewarming at 2042. At start of rewarming pt. Temperature was already 36.9. Will continue to monitor. Wells Guiles, Rn verified rewarming.

## 2018-11-14 NOTE — Procedures (Signed)
EEG Report  Clinical History: V.Fib cardiac arrest with ROSC in 15 minutes.  Initially on cooling protocol, but developed hypotension and bradycardia so paralytic stopped 2 hours before EEG and Propofol stopped yesterday.  He is on Versed and Fentanyl during the EEG.  Technical Summary:  A 19 channel digital EEG recording was performed using the 10-20 international system of electrode placement.  Bipolar and Referential montages were used.  The total recording time was approx 20 minutes.  Findings:  There is no posterior dominant rhythm.  There is low voltage generalized anterior to posterior alpha/beta frequency activity symmetrically.  There is no reactivity during the recording.  Intermittently, there is muscle artifact presumably due to shivering obscuring the EEG.   There are no epileptiform discharges or electrographic seizures present.     Impression:  This is an abnormal EEG.  The study is sub-optimal due to muscle artifact secondary to presumed shivering from prior hypothermia.   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  The patient is not in non-convulsive status epilepticus.    Rogue Jury, MS, MD

## 2018-11-15 ENCOUNTER — Telehealth: Payer: Self-pay | Admitting: Internal Medicine

## 2018-11-15 DIAGNOSIS — G931 Anoxic brain damage, not elsewhere classified: Secondary | ICD-10-CM

## 2018-11-15 LAB — GLUCOSE, CAPILLARY
Glucose-Capillary: 72 mg/dL (ref 70–99)
Glucose-Capillary: 94 mg/dL (ref 70–99)
Glucose-Capillary: 97 mg/dL (ref 70–99)
Glucose-Capillary: 108 mg/dL — ABNORMAL HIGH (ref 70–99)
Glucose-Capillary: 104 mg/dL — ABNORMAL HIGH (ref 70–99)

## 2018-11-15 LAB — CBC
HCT: 41.7 % (ref 39.0–52.0)
Hemoglobin: 14.2 g/dL (ref 13.0–17.0)
MCH: 29 pg (ref 26.0–34.0)
MCHC: 34.1 g/dL (ref 30.0–36.0)
MCV: 85.1 fL (ref 80.0–100.0)
Platelets: 183 10*3/uL (ref 150–400)
RBC: 4.9 MIL/uL (ref 4.22–5.81)
RDW: 13.5 % (ref 11.5–15.5)
WBC: 11 10*3/uL — ABNORMAL HIGH (ref 4.0–10.5)
nRBC: 0 % (ref 0.0–0.2)

## 2018-11-15 LAB — BASIC METABOLIC PANEL
Anion gap: 8 (ref 5–15)
BUN: 13 mg/dL (ref 6–20)
CO2: 23 mmol/L (ref 22–32)
Calcium: 8.4 mg/dL — ABNORMAL LOW (ref 8.9–10.3)
Chloride: 111 mmol/L (ref 98–111)
Creatinine, Ser: 0.88 mg/dL (ref 0.61–1.24)
GFR calc Af Amer: >60 mL/min (ref >60)
GFR calc non Af Amer: >60 mL/min (ref >60)
Glucose, Bld: 81 mg/dL (ref 70–99)
Potassium: 3.2 mmol/L — ABNORMAL LOW (ref 3.5–5.1)
Sodium: 142 mmol/L (ref 135–145)

## 2018-11-15 LAB — PHOSPHORUS: Phosphorus: 3.2 mg/dL (ref 2.5–4.6)

## 2018-11-15 LAB — MAGNESIUM: Magnesium: 1.9 mg/dL (ref 1.7–2.4)

## 2018-11-15 MED ORDER — METOPROLOL TARTRATE 5 MG/5ML IV SOLN
5.0000 mg | Freq: Four times a day (QID) | INTRAVENOUS | Status: DC
  Administered 2018-11-15 – 2018-11-17 (×10): 5 mg via INTRAVENOUS
  Filled 2018-11-15 (×9): qty 5

## 2018-11-15 MED ORDER — VITAL 1.5 CAL PO LIQD
1000.0000 mL | ORAL | Status: DC
  Administered 2018-11-15: 14:00:00 1000 mL
  Filled 2018-11-15 (×5): qty 1000

## 2018-11-15 MED ORDER — POTASSIUM CHLORIDE 20 MEQ/15ML (10%) PO SOLN
20.0000 meq | ORAL | Status: AC
  Administered 2018-11-15 (×2): 20 meq
  Filled 2018-11-15 (×2): qty 15

## 2018-11-15 MED ORDER — PRO-STAT SUGAR FREE PO LIQD
30.0000 mL | Freq: Two times a day (BID) | ORAL | Status: DC
  Administered 2018-11-15 – 2018-11-17 (×5): 30 mL
  Filled 2018-11-15 (×4): qty 30

## 2018-11-15 MED ORDER — HYDRALAZINE HCL 20 MG/ML IJ SOLN
20.0000 mg | Freq: Once | INTRAMUSCULAR | Status: AC
  Administered 2018-11-15: 05:00:00 20 mg via INTRAVENOUS
  Filled 2018-11-15: qty 1

## 2018-11-15 MED ORDER — DEXTROSE-NACL 5-0.9 % IV SOLN
INTRAVENOUS | Status: DC
  Administered 2018-11-15 – 2018-11-17 (×8): via INTRAVENOUS

## 2018-11-15 MED ORDER — HYDRALAZINE HCL 20 MG/ML IJ SOLN
20.0000 mg | INTRAMUSCULAR | Status: DC | PRN
  Administered 2018-11-15 – 2018-11-17 (×5): 20 mg via INTRAVENOUS
  Filled 2018-11-15 (×6): qty 1

## 2018-11-15 NOTE — Progress Notes (Signed)
eLink Physician-Brief Progress Note Patient Name: Tony Reynolds DOB: 04/10/1971 MRN: [id number]   Date of Service  11/15/2018  HPI/Events of Note  SBP still high again.  Back on sedation.  eICU Interventions  Avoiding bet blockers/AB blockers. Hydralazine 20 mg IV once for now. If still elevated, need Cardene drip.      Intervention Category Intermediate Interventions: Hypertension - evaluation and management  Elmer Sow 11/15/2018, 4:37 AM

## 2018-11-15 NOTE — Plan of Care (Signed)

## 2018-11-15 NOTE — Progress Notes (Signed)
Progress Note  Patient Name: Tony Reynolds Date of Encounter: 11/15/2018  Primary Cardiologist: Virl Axe, MD   Subjective   Intubated, sedated  Inpatient Medications    Scheduled Meds:  aspirin  81 mg Per Tube Daily   atorvastatin  20 mg Per Tube q1800   chlorhexidine gluconate (MEDLINE KIT)  15 mL Mouth Rinse BID   Chlorhexidine Gluconate Cloth  6 each Topical Daily   Chlorhexidine Gluconate Cloth  6 each Topical Daily   famotidine  20 mg Per Tube BID   heparin  5,000 Units Subcutaneous Q8H   insulin aspart  0-15 Units Subcutaneous Q4H   mouth rinse  15 mL Mouth Rinse 10 times per day   potassium chloride  20 mEq Per Tube Q4H   potassium chloride  40 mEq Per Tube Once   rocuronium  50 mg Intravenous Once   sodium chloride flush  10-40 mL Intracatheter Q12H   Continuous Infusions:  sodium chloride 75 mL/hr at 11/15/18 0700   sodium chloride 10 mL/hr at 11/15/18 0700   fentaNYL infusion INTRAVENOUS 250 mcg/hr (11/15/18 0700)   midazolam Stopped (11/14/18 2118)   norepinephrine (LEVOPHED) Adult infusion Stopped (11/14/18 1130)   propofol (DIPRIVAN) infusion Stopped (11/14/18 0947)   PRN Meds: acetaminophen, fentaNYL, hydrALAZINE, midazolam, midazolam, sodium chloride flush   Vital Signs    Vitals:   11/15/18 0700 11/15/18 0724 11/15/18 0740 11/15/18 0744  BP:  (!) 180/82 (!) 178/75   Pulse: 85  95   Resp: 18  19   Temp:      TempSrc:      SpO2: 100%  100% 100%  Weight:        Intake/Output Summary (Last 24 hours) at 11/15/2018 0804 Last data filed at 11/15/2018 0700 Gross per 24 hour  Intake 2632.49 ml  Output 3810 ml  Net -1177.51 ml   Last 3 Weights 11/15/2018 11/14/2018 11/05/2018  Weight (lbs) 178 lb 12.7 oz 176 lb 9.4 oz 170 lb  Weight (kg) 81.1 kg 80.1 kg 77.111 kg      Telemetry    Sinus rhythm, HR 90's occasional PVC's - Personally Reviewed   Physical Exam  Withdraws to sternal rub, shivering/shaking but rewarmed GEN:  minimal responsiveness Neck: No JVD, R IJ line in place Cardiac: RRR, no murmurs, rubs, or gallops.  Respiratory: Clear to auscultation bilaterally. GI: Soft, nontender, non-distended  MS: trace diffuse edema; No deformity. Neuro:  minimally responsive  Labs    High Sensitivity Troponin:   Recent Labs  Lab 11/10/2018 1201 11/09/2018 1513 10/26/2018 1632 10/29/2018 1850 11/04/2018 2212  TROPONINIHS 29.00* 86* 620* 1,567* 2,776*      Cardiac EnzymesNo results for input(s): TROPONINI in the last 168 hours. No results for input(s): TROPIPOC in the last 168 hours.   Chemistry Recent Labs  Lab 11/10/2018 1009 11/11/2018 1513  11/14/18 0533  11/14/18 0834  11/14/18 2040 11/14/18 2159 11/15/18 0338  NA 142 139   < > 141   < > 143   < > 146* 146* 142  K 3.9 3.2*   < > 3.8   < > 3.5   < > 3.9 4.1 3.2*  CL 108 107   < > 115*  --  116*  --   --   --  111  CO2 25 15*  --  16*  --   --   --   --   --  23  GLUCOSE 96 241*   < > 127*  --  111*   < > 77 76 81  BUN 11 13   < > 22*  --  21*  --   --   --  13  CREATININE 0.92 1.73*   < > 1.00  --  0.80  --   --   --  0.88  CALCIUM 9.5 8.8*  --  8.0*  --   --   --   --   --  8.4*  PROT 8.0 6.6  --   --   --   --   --   --   --   --   ALBUMIN 4.5 3.7  --   --   --   --   --   --   --   --   AST 35 142*  --   --   --   --   --   --   --   --   ALT 27 117*  --   --   --   --   --   --   --   --   ALKPHOS 89 116  --   --   --   --   --   --   --   --   BILITOT 0.5 0.5  --   --   --   --   --   --   --   --   GFRNONAA >60 47*  --  >60  --   --   --   --   --  >60  GFRAA >60 54*  --  >60  --   --   --   --   --  >60  ANIONGAP 9 17*  --  10  --   --   --   --   --  8   < > = values in this interval not displayed.     Hematology Recent Labs  Lab 11/14/2018 2212  11/14/18 0533  11/14/18 2040 11/14/18 2159 11/15/18 0338  WBC 12.3*  --  11.1*  --   --   --  11.0*  RBC 5.27  --  4.97  --   --   --  4.90  HGB 15.5   < > 14.5   < > 15.3 14.3 14.2  HCT  44.5   < > 41.8   < > 45.0 42.0 41.7  MCV 84.4  --  84.1  --   --   --  85.1  MCH 29.4  --  29.2  --   --   --  29.0  MCHC 34.8  --  34.7  --   --   --  34.1  RDW 12.9  --  13.0  --   --   --  13.5  PLT 238  --  218  --   --   --  183   < > = values in this interval not displayed.    BNP Recent Labs  Lab 11/03/2018 1009  BNP 128.0*     DDimer No results for input(s): DDIMER in the last 168 hours.   Radiology    Ct Head Wo Contrast  Result Date: 10/18/2018 CLINICAL DATA:  Seizure activity, STEMI, altered level of consciousness, history hypertension EXAM: CT HEAD WITHOUT CONTRAST TECHNIQUE: Contiguous axial images were obtained from the base of the skull through the vertex without intravenous contrast. Sagittal and coronal MPR images reconstructed from axial data set. COMPARISON:  None FINDINGS: Brain: Normal ventricular morphology. No midline shift or mass effect. Normal appearance of brain parenchyma. No intracranial hemorrhage, mass lesion, evidence of acute infarction, or extra-axial fluid collection. Vascular: No hyperdense vessels Skull: Intact Sinuses/Orbits: Clear Other: N/A IMPRESSION: No acute intracranial abnormalities. Electronically Signed   By: Lavonia Dana M.D.   On: 11/09/2018 16:06   Dg Chest Port 1 View  Result Date: 11/14/2018 CLINICAL DATA:  Cardiac arrest EXAM: PORTABLE CHEST 1 VIEW COMPARISON:  Chest x-rays dated 11/05/2018 and 10/28/2018 FINDINGS: Endotracheal tube is adequately positioned with tip at the level of the clavicles. Enteric tube passes below the diaphragm. RIGHT IJ central line remains adequately positioned with tip at the level of the mid/lower SVC. Pacer pads overlie the LEFT heart. Heart size and mediastinal contours are stable. Lungs are clear. No pleural effusion or pneumothorax seen. IMPRESSION: 1. Support apparatus appears stable in position. 2. Lungs are clear.  No evidence of pneumonia or pulmonary edema. Electronically Signed   By: Franki Cabot M.D.    On: 11/14/2018 08:04   Dg Chest Port 1 View  Result Date: 10/22/2018 CLINICAL DATA:  Central line placement EXAM: PORTABLE CHEST 1 VIEW COMPARISON:  Portable exam 1820 hours compared to 1523 hours FINDINGS: Tip of endotracheal tube projects 5.5 cm above carina. Nasogastric tube tip projects over a distal esophagus, recommend advancing tube 15 cm to place proximal side-port within stomach. RIGHT jugular central venous catheter with tip projecting over SVC. External pacing leads noted. Normal heart size, mediastinal contours, and pulmonary vascularity. Lungs clear. No infiltrate, pleural effusion, or pneumothorax. IMPRESSION: No pneumothorax following RIGHT jugular line placement. Recommend advancing nasogastric tube 15 cm to place proximal side-port within stomach. Electronically Signed   By: Lavonia Dana M.D.   On: 10/18/2018 18:48   Dg Chest Portable 1 View  Result Date: 11/08/2018 CLINICAL DATA:  S/p VFib EXAM: PORTABLE CHEST 1 VIEW COMPARISON:  11/15/2018, 10:23 a.m. FINDINGS: Interval placement of endotracheal tube, tip below the thoracic inlet. Interval placement of esophagogastric tube, tip at the gastroesophageal junction and side port over the lower esophagus. Recommend advancement. Otherwise unchanged AP portable examination. No acute airspace opacity. IMPRESSION: 1. Interval placement of endotracheal tube, tip below the thoracic inlet. 2. Interval placement of esophagogastric tube, tip at the gastroesophageal junction and side port over the lower esophagus. Recommend advancement. 3. Otherwise unchanged AP portable examination. No acute airspace opacity. Electronically Signed   By: Eddie Candle M.D.   On: 10/25/2018 15:34   Dg Abd Portable 1v  Result Date: 11/14/2018 CLINICAL DATA:  OG tube placement EXAM: PORTABLE ABDOMEN - 1 VIEW COMPARISON:  None. FINDINGS: The OG tube coils within the gastric body with the tip terminating over the gastric antrum/pylorus. The visualized bowel gas pattern is  nonspecific. There may be a few dilated loops of small bowel in the upper abdomen. IMPRESSION: 1. Enteric tube tip projects over the gastric antrum/pylorus. The tip is pointed distally. 2. There appear to be a few mildly dilated loops of small bowel in the upper abdomen. Electronically Signed   By: Constance Holster M.D.   On: 11/14/2018 02:01   Dg Abd Portable 1v  Result Date: 11/14/2018 CLINICAL DATA:  48 year old male status post enteric tube placement. EXAM: PORTABLE ABDOMEN - 1 VIEW COMPARISON:  None. FINDINGS: Partially visualized enteric tube with side-port just distal to the gastroesophageal junction and tip in the proximal gastric body. Recommend further advancing the tube by approximately 5 cm for optimal positioning. Air noted  within the colon. IMPRESSION: Enteric tube with tip in the proximal gastric body. Electronically Signed   By: Anner Crete M.D.   On: 10/26/2018 21:26    Cardiac Studies   Echo: IMPRESSIONS    1. The left ventricle has normal systolic function, with an ejection fraction of 55-60%. The cavity size was normal. There is moderately increased left ventricular wall thickness. Left ventricular diastolic parameters were normal.  2. The right ventricle has normal systolic function. The cavity was normal. There is no increase in right ventricular wall thickness.  3. No evidence of mitral valve stenosis.  4. The aortic valve is tricuspid. Mild thickening of the aortic valve. Mild calcification of the aortic valve. No stenosis of the aortic valve. Mild aortic annular calcification noted.  5. The aortic root is normal in size and structure.  6. Pulmonary hypertension is indeterminate, inadequate TR jet  Patient Profile     48 y.o. male with OOH VF arrest  Assessment & Plan    1. Cardiac Arrest: VF, prior arrest noted. Previously noted to have severe LV dysfunction secondary to NICM. Hx SQ ICD removed for pocket infection then LifeVest but lost to clinical  follow-up.  Tele reviewed no further bradycardia or ventricular arrhythmia except for isolated PVC's. Will follow for neurologic recovery.  2. Bradycardia: resolved with rewarming  3. CAD - nonobstructive at last cath in 2019.  4. HTN, uncontrolled. Treated with prn hydralazine. Add metoprolol IV scheduled dose now that HR has stabilized and remains > 70 bpm.  5. Anoxic brain injury: neuro following.    For questions or updates, please contact [place] Please consult [email or web address] for contact info under        Signed, Sherren Mocha, MD  11/15/2018, 8:04 AM

## 2018-11-15 NOTE — Progress Notes (Signed)
Alerted CCM of HTN, and biting on ETT. Restarted Fentanyl per Elink. Will continue to monitor

## 2018-11-15 NOTE — Progress Notes (Signed)
Appreciate neuro input MRI brain ordered  Tube feeding started

## 2018-11-15 NOTE — Telephone Encounter (Signed)
New Message    Pts wife has FMLA paperwork she needs help filling out     Please call back

## 2018-11-15 NOTE — Progress Notes (Signed)
CCM notified about patient having continued hypertension despite PRN and scheduled medication efforts. MD to place new orders. Will continue to monitor.

## 2018-11-15 NOTE — Progress Notes (Addendum)
NAME:  Tony Reynolds, MRN:  [id number], DOB:  1970-08-14, LOS: 2 ADMISSION DATE:  11/11/2018, CONSULTATION DATE:  10/31/2018 REFERRING MD:  ER, CHIEF COMPLAINT:  Arrest   Brief History   48 year old man with mixed ischemic/nonischemic cardiomyopathy, prior Vfib arrest presenting with Vfib arrest. Hypothermia protocol-cooled to 33 but became bradycardic Cooling goals was changed Rewarmed-goal achieved about 9 PM 11/14/2018  History of present illness   48 year old smoker with history of Vfib arrest presenting with another Vfib arrest.  History per Dr. Corky SingHenry Smith's note and confirmed with ER staff: "He has had prior cardiac arrest in December 2018.  Had nonobstructive coronary disease at that time with reduction in LV function.  Subsequently had AICD implantation in the subcutaneous space in January 2019.  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  Patient presented to emergency room yesterday with an episode of syncope but signed out AMA.  He had a "seizure" today with collapse.  CPR was not provided by bystanders, EMS arrived 6 to 7 minutes after collapse.  At least 6 minutes of CPR was provided with 2 epinephrine and defibrillation from ventricular fibrillation--> asystole--> SVT/A. Fib--> sinus rhythm."  Intubated in field.  Did not fully wake up but was moving extremities and now is sedated. Intensivist service has been asked to admit.  Past Medical History  Nonischemic cardiomyopathy although has a pretty good LAD lesion I guess with TIMI 3 flow so thought noncontributory.  Smoker  Prior Vfib arrest  Prior AICD complicated by infection and explantation  Significant Hospital Events   10/22/2018 Admission  Consults:  10/31/2018 Cardiology and hopefully EP  Procedures:  11/02/2018 Intubation in field  Significant Diagnostic Tests:  11/16/2018 CT Head:>>>  Micro Data:  10/21/2018: COVID negative 10/20/2018: HIV screen negative  Antimicrobials:  None   Interim  history/subjective:  Admitted No overnight events, shivering  Objective   Blood pressure (!) 178/75, pulse 95, temperature 98.4 F (36.9 C), temperature source Bladder, resp. rate 19, weight 81.1 kg, SpO2 100 %. CVP:  [4 mmHg-29 mmHg] 8 mmHg  Vent Mode: CPAP;PSV FiO2 (%):  [40 %] 40 % Set Rate:  [18 bmp] 18 bmp Vt Set:  [510 mL] 510 mL PEEP:  [5 cmH20] 5 cmH20 Pressure Support:  [10 cmH20] 10 cmH20 Plateau Pressure:  [15 cmH20-26 cmH20] 15 cmH20   Intake/Output Summary (Last 24 hours) at 11/15/2018 [id number] Last data filed at 11/15/2018 0700 Gross per 24 hour  Intake 2632.49 ml  Output 3810 ml  Net -1177.51 ml   Filed Weights   11/14/18 0500 11/15/18 0500  Weight: 80.1 kg 81.1 kg    Examination:  48 year old gentleman, intubated, on mechanical ventilation Anicteric, pupils about 3 mm, reactive Moist oral mucosa, endotracheal tube in place Trachea is midline, no JVD Clear breath sounds anteriorly S1-S2 appreciated Bowel sounds appreciated Extremities shows no edema, no clubbing Sedated  Resolved Hospital Problem list   None  Assessment & Plan:   Ventricular fibrillation, status post cardioversion, about 15 minutes before return of spontaneous circulation in about 7 minutes downtime prior to initiation of CPR Acute on chronic systolic heart failure, prior cardiac catheterization January 2019-nonobstructive coronary artery disease, 50% mid LAD, 75% diagonal -Hypothermia protocol at 33 C-did develop hypotension, bradycardia -Switch to 36 degrees on 11/14/2018 -Now rewarmed -Continue Versed -Discussed with cardiology  Hypertension -Will be started on medications -Cardiology following -Bradycardia resolved  Hyperchloremia likely from fluid resuscitation -Continue to monitor  Encephalopathy -EEG is suggestive  of hypoxemic injury -Neurology consulted  Lactic acidosis, anion gap metabolic acidosis -Trend electrolytes -Continue to monitor closely  Hypoxemic  respiratory failure -Continue ventilator support -Tolerating pressure support ventilation  Elevated troponin -Likely related to demand ischemia from cardiac arrest -Continue to monitor -Cardiology following  Neurologically -Withdrawing to pain -We will ask neurology for follow-up  Hypoglycemia overnight -We will switch to D5 half-normal saline  Best practice:  Diet: NPO Pain/Anxiety/Delirium protocol (if indicated): As needed VAP protocol (if indicated): ordered 6/27 DVT prophylaxis: Lovenox GI prophylaxis: Pepcid Glucose control: SSI Mobility: Bedrest Code Status: full Family Communication: We will reach out to family Disposition: ICU  Labs   CBC: Recent Labs  Lab 10/22/2018 1009 27-Nov-2018 1513  11/27/18 2212  11/14/18 0533  11/14/18 1610 11/14/18 1817 11/14/18 2040 11/14/18 2159 11/15/18 0338  WBC 6.7 8.0  --  12.3*  --  11.1*  --   --   --   --   --  11.0*  NEUTROABS 4.4 3.9  --   --   --   --   --   --   --   --   --   --   HGB 15.2 15.1   < > 15.5   < > 14.5   < > 13.3 13.9 15.3 14.3 14.2  HCT 44.7 44.4   < > 44.5   < > 41.8   < > 39.0 41.0 45.0 42.0 41.7  MCV 85.3 85.9  --  84.4  --  84.1  --   --   --   --   --  85.1  PLT 205 240  --  238  --  218  --   --   --   --   --  183   < > = values in this interval not displayed.    Basic Metabolic Panel: Recent Labs  Lab 10/19/2018 1009 November 27, 2018 1513  2018/11/27 2257 11/14/18 0111 11/14/18 0533  11/14/18 0834  11/14/18 1610 11/14/18 1817 11/14/18 2040 11/14/18 2159 11/15/18 0338  NA 142 139   < > 144 144 141   < > 143   < > 146* 146* 146* 146* 142  K 3.9 3.2*   < > 3.0* 3.5 3.8   < > 3.5   < > 3.8 3.9 3.9 4.1 3.2*  CL 108 107   < > 113* 115* 115*  --  116*  --   --   --   --   --  111  CO2 25 15*  --   --   --  16*  --   --   --   --   --   --   --  23  GLUCOSE 96 241*   < > 177* 120* 127*  --  111*   < > 80 86 77 76 81  BUN 11 13   < > 22* 23* 22*  --  21*  --   --   --   --   --  13  CREATININE 0.92  1.73*   < > 0.90 0.90 1.00  --  0.80  --   --   --   --   --  0.88  CALCIUM 9.5 8.8*  --   --   --  8.0*  --   --   --   --   --   --   --  8.4*  MG 2.1 2.0  --   --   --  2.5*  --   --   --   --   --   --   --  1.9   < > = values in this interval not displayed.   GFR: Estimated Creatinine Clearance: 106 mL/min (by C-G formula based on SCr of 0.88 mg/dL). Recent Labs  Lab 06/11/2018 1513 06/11/2018 1632 06/11/2018 1850 06/11/2018 2212 11/14/18 0533 11/15/18 0338  WBC 8.0  --   --  12.3* 11.1* 11.0*  LATICACIDVEN  --  3.3* 3.0*  --   --   --     Liver Function Tests: Recent Labs  Lab 11/11/2018 1009 06/11/2018 1513  AST 35 142*  ALT 27 117*  ALKPHOS 89 116  BILITOT 0.5 0.5  PROT 8.0 6.6  ALBUMIN 4.5 3.7   Recent Labs  Lab 06/11/2018 1513  LIPASE 42   No results for input(s): AMMONIA in the last 168 hours.  ABG    Component Value Date/Time   PHART 7.381 11/14/2018 0541   PCO2ART 25.6 (L) 11/14/2018 0541   PO2ART 153.0 (H) 11/14/2018 0541   HCO3 16.0 (L) 11/14/2018 0541   TCO2 18 (L) 11/14/2018 0834   ACIDBASEDEF 9.0 (H) 11/14/2018 0541   O2SAT 99.0 11/14/2018 0541     Coagulation Profile: Recent Labs  Lab 06/11/2018 1850 11/14/18 0221  INR 1.0 1.0    Cardiac Enzymes: No results for input(s): CKTOTAL, CKMB, CKMBINDEX, TROPONINI in the last 168 hours.  HbA1C: Hgb A1c MFr Bld  Date/Time Value Ref Range Status  01/24/202020 04:32 PM 5.7 (H) 4.8 - 5.6 % Final    Comment:    REPEATED TO VERIFY (NOTE) Pre diabetes:          5.7%-6.4% Diabetes:              >6.4% Glycemic control for   <7.0% adults with diabetes     CBG: Recent Labs  Lab 11/14/18 1259 11/14/18 1944 11/14/18 2315 11/14/18 2349 11/15/18 0340  GLUCAP 95 82 69* 94 72    Review of Systems:   Cannot assess- comatose.  Past Medical History  He,  has a past medical history of Arthritis, Back pain, Eczema, Hypertension, Lumbar disc herniation, and MI (myocardial infarction) (HCC) (2018).    Surgical History    Past Surgical History:  Procedure Laterality Date  . BILATERAL ANTERIOR TOTAL HIP ARTHROPLASTY Bilateral 12/14/2015   Procedure: BILATERAL ANTERIOR TOTAL HIP ARTHROPLASTY;  Surgeon: Kathryne Hitchhristopher Y Blackman, MD;  Location: [facility];  Service: Orthopedics;  Laterality: Bilateral;  . CORONARY/GRAFT ACUTE MI REVASCULARIZATION N/A 05/19/2017   Procedure: Coronary/Graft Acute MI Revascularization;  Surgeon: Tonny Bollmanooper, Michael, MD;  [facility];  Service: Cardiovascular;  Laterality: N/A;  . ICD GENERATOR REMOVAL N/A 11/09/2017   Procedure: ICD GENERATOR REMOVAL;  Surgeon: Duke SalviaKlein, Steven C, MD;  [facility];  Service: Cardiovascular;  Laterality: N/A;  . LEFT HEART CATH AND CORONARY ANGIOGRAPHY N/A 05/19/2017   Procedure: LEFT HEART CATH AND CORONARY ANGIOGRAPHY;  Surgeon: Tonny Bollmanooper, Michael, MD;  [facility];  Service: Cardiovascular;  Laterality: N/A;  . NO PAST SURGERIES    . POCKET REVISION/RELOCATION N/A 05/06/2018   Procedure: POCKET REVISION;  Surgeon: Marinus Mawaylor, Gregg W, MD;  [facility];  Service: Cardiovascular;  Laterality: N/A;  . SUBQ ICD IMPLANT N/A 07/17/2017   Procedure: SUBQ ICD IMPLANT;  Surgeon: Duke SalviaKlein, Steven C, MD;  [facility];  Service: Cardiovascular;  Laterality: N/A;     Social  History   reports that he has been smoking cigarettes. He started smoking about 29 years ago. He has been smoking about 0.50 packs per day. He has never used smokeless tobacco. He reports previous alcohol use. He reports that he does not use drugs.   Family History   His family history is not on file.   Allergies Allergies  Allergen Reactions  . Tomato Other (See Comments)    No acidic foods - eczema   The patient is critically ill with multiple organ system failure and requires high complexity decision making for assessment and support, frequent evaluation and titration of therapies, advanced monitoring,  review of radiographic studies and interpretation of complex data.    Critical Care Time devoted to patient care services, exclusive of separately billable procedures, described in this note is 35 minutes.  Virl Diamond, MD [place], PCCM Cell: [phone]

## 2018-11-15 NOTE — Progress Notes (Signed)
Initial Nutrition Assessment  RD working remotely.  DOCUMENTATION CODES:   Not applicable  INTERVENTION:   Tube feeding: - Vital 1.5 @ 45 ml/hr (1080 ml/day) via OGT - Pro-stat 30 ml BID  Tube feeding regimen provides 1820 kcal, 103 grams of protein, and 825 ml of H2O (100% of needs).  NUTRITION DIAGNOSIS:   Inadequate oral intake related to inability to eat as evidenced by NPO status.  GOAL:   Patient will meet greater than or equal to 90% of their needs  MONITOR:   Vent status, Labs, I & O's, Weight trends, TF tolerance  REASON FOR ASSESSMENT:   Ventilator, Consult Enteral/tube feeding initiation and management  ASSESSMENT:   48 year old male who presented to the ED on 6/27 after a witnessed cardiac arrest. Downtime about 6 to 7 minutes without CPR. Pt intubated in the field. PMH of HTN, ventricular fibrillation, cardiomyopathy, VF arrest. TTM 33C protocol initiated on 6/27 but switched to [facility] on 6/28 due to pt developing hypotension and bradycardia.  Pt rewarmed with goal achieved about 9 PM on 6/28. EEG is suggestive of hypoxic injury. Neurology consulted.  Verbal with readback order placed per Dr. Ander Slade for TF initiation and management.  OGT in place, currently to low intermittent suction.  Current weight recorded as 81.1 kg. Noted pt's weight on 6/26 was 77.1 kg. Suspect current weight is slightly elevated due to fluid resuscitation. Will utilize 77.1 kg as EDW to estimate nutritional needs. Noted pt's weight has fluctuated between 75-83 kg over the last year. Will continue to monitor trends.  Unable to obtain diet and weight history from pt at this time.  Patient is currently intubated on ventilator support MV: 10 L/min Temp (24hrs), Avg:98.1 F (36.7 C), Min:96.1 F (35.6 C), Max:99.5 F (37.5 C) BP (a-line): 161/79 MAP (a-line): 107  Drips: Fentanyl: off this AM NS: 10 ml/hr D5: 100 ml/hr  Medications reviewed and include: Pepcid, SSI q 4  hours  Labs reviewed: potassium 3.2 CBG's: 69-95 x 24 hours  UOP: 3435 ml x 24 hours OGT: 500 ml x 24 hours I/O's: +1.0 L since admit  NUTRITION - FOCUSED PHYSICAL EXAM:  Unable to complete at this time. RD working remotely.  Diet Order:   Diet Order            Diet NPO time specified  Diet effective now              EDUCATION NEEDS:   No education needs have been identified at this time  Skin:  Skin Assessment: Reviewed RN Assessment  Last BM:  11/14/18 small type 6  Height:   Ht Readings from Last 1 Encounters:  Nov 20, 2018 5\' 6"  (1.676 m)    Weight:   Wt Readings from Last 1 Encounters:  11/15/18 81.1 kg   EDW: 77.1 kg  Ideal Body Weight:  64.5 kg  BMI:  Body mass index is 28.86 kg/m.  Estimated Nutritional Needs:   Kcal:  1194  Protein:  95-110 grams  Fluid:  1.8 L    Gaynell Face, MS, RD, LDN Inpatient Clinical Dietitian Pager: [phone] Weekend/After Hours: [phone]

## 2018-11-15 NOTE — Consult Note (Addendum)
Neurology Consultation  Reason for Consult: Prognostication given anoxic brain injury  Referring Physician: Dr. Valeta Harms  CC: Cardiac arrest  History is obtained from: notes and bedside nurse  HPI: Tony Reynolds is a 48 y.o. male admitted to CVICU for cardiac arrest following a syncopal episode followed by a fall. Total downtime without CPR was 7 mins, approximately 15 mins of total code. Initial CT was benign, however poor initial neuro exam. TTM was initiated, patient was cooled to 33 C, but warmed to 36 C following episodes of hypotension and bradycardia.   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  On assessment, patient remains intubated, no sedation, temp 36.7C. He appears to be shivering, per nurse this has been an ongoing/continuous issue. He opens eyes to voice, has a fixed gaze at midline, 2 mm pupils that are sluggishly reactive to light, does not track. He blinks to threat, has a corneal reflex, but no oculocephalic reflex. Strong cough/gag. Hypertonic overall, semi-purposeful movements to pain.   Work up that has been done: - [place] wo contrast 6/27: no abnormalities  - EEG 6/28: alpha coma pattern for hypoxic brain injury, negative for status.  ROS: Unable to obtain due to altered mental status.   Past Medical History:  Diagnosis Date  . Arthritis   . Back pain   . Eczema   . Hypertension   . Lumbar disc herniation   . MI (myocardial infarction) ([place]) 2018    History reviewed. No pertinent family history.  Social History:   reports that he has been smoking cigarettes. He started smoking about 29 years ago. He has been smoking about 0.50 packs per day. He has never used smokeless tobacco. He reports previous alcohol use. He reports that he does not use  drugs.  Medications  Current Facility-Administered Medications:  .  0.9 %  sodium chloride infusion, , Intravenous, Continuous, Icard, Bradley L, DO, Last Rate: 10 mL/hr at 11/15/18 0900 .  acetaminophen (TYLENOL) tablet 500 mg, 500 mg, Per NG tube, Q8H PRN, Cecilie Lowers T, MD, 500 mg at 11/14/18 2326 .  aspirin chewable tablet 81 mg, 81 mg, Per Tube, Daily, Icard, Bradley L, DO, 81 mg at 11/15/18 0900 .  atorvastatin (LIPITOR) tablet 20 mg, 20 mg, Per Tube, q1800, Icard, Bradley L, DO, 20 mg at 11/14/18 1703 .  chlorhexidine gluconate (MEDLINE KIT) (PERIDEX) 0.12 % solution 15 mL, 15 mL, Mouth Rinse, BID, Candee Furbish, MD, 15 mL at 11/15/18 0725 .  Chlorhexidine Gluconate Cloth 2 % PADS 6 each, 6 each, Topical, Daily, Candee Furbish, MD, 6 each at 11/15/18 1029 .  Chlorhexidine Gluconate Cloth 2 % PADS 6 each, 6 each, Topical, Daily, Candee Furbish, MD, 6 each at 11/15/18 1029 .  dextrose 5 %-0.9 % sodium chloride infusion, , Intravenous, Continuous, Olalere, Adewale A, MD, Last Rate: 100 mL/hr at 11/15/18 0927 .  famotidine (PEPCID) 40 MG/5ML suspension 20 mg, 20 mg, Per Tube, BID, Icard, Bradley L, DO, 20 mg at 11/15/18 0900 .  feeding supplement (PRO-STAT SUGAR FREE 64) liquid 30 mL, 30 mL, Per Tube, BID, Olalere, Adewale A, MD .  feeding supplement (VITAL 1.5 CAL) liquid 1,000 mL, 1,000 mL, Per Tube, Continuous, Olalere, Adewale A, MD .  fentaNYL (SUBLIMAZE) bolus via infusion 50 mcg, 50 mcg, Intravenous, Q30 min  PRN, Candee Furbish, MD, 50 mcg at 11/15/18 0544 .  fentaNYL 2528mg in NS 2585m(1022mml) infusion-PREMIX, 100-300 mcg/hr, Intravenous, Continuous, SmiCandee FurbishD, Stopped at 11/15/18 [id number] heparin injection 5,000 Units, 5,000 Units, Subcutaneous, Q8H, Icard, Bradley L, DO, 5,000 Units at 11/15/18 0506 .  hydrALAZINE (APRESOLINE) injection 10 mg, 10 mg, Intravenous, Q6H PRN, MohCecilie Lowers MD, 10 mg at 11/15/18 0724 .  insulin aspart (novoLOG) injection 0-15 Units,  0-15 Units, Subcutaneous, Q4H, SmiCandee FurbishD, 2 Units at 11/14/18 0018 .  MEDLINE mouth rinse, 15 mL, Mouth Rinse, 10 times per day, SmiCandee FurbishD, 15 mL at 11/15/18 1029 .  metoprolol tartrate (LOPRESSOR) injection 5 mg, 5 mg, Intravenous, Q6H, CooSherren MochaD, 5 mg at 11/15/18 0848 .  midazolam (VERSED) 50 mg/50 mL (1 mg/mL) premix infusion, 0-10 mg/hr, Intravenous, Continuous, Icard, Bradley L, DO, Stopped at 11/14/18 2118 .  midazolam (VERSED) bolus via infusion 1-2 mg, 1-2 mg, Intravenous, Q2H PRN, Icard, Bradley L, DO, 4 mg at 11/14/18 1418 .  midazolam (VERSED) injection 1 mg, 1 mg, Intravenous, Q2H PRN, Icard, Bradley L, DO, 1 mg at 11/15/18 0120 .  norepinephrine (LEVOPHED) 16 mg in 250m22memix infusion, 0-50 mcg/min, Intravenous, Titrated, SmitCandee Furbish, Stopped at 11/14/18 1130 .  propofol (DIPRIVAN) 1000 MG/100ML infusion, 25-80 mcg/kg/min, Intravenous, Continuous, SmitCandee Furbish, Stopped at 11/14/18 [id number] (ZEMURON) injection 50 mg, 50 mg, Intravenous, Once, Icard, Bradley L, DO .  sodium chloride flush (NS) 0.9 % injection 10-40 mL, 10-40 mL, Intracatheter, Q12H, SmitCandee Furbish, 10 mL at 11/15/18 0900 .  sodium chloride flush (NS) 0.9 % injection 10-40 mL, 10-40 mL, Intracatheter, PRN, SmitCandee Furbish  Exam: Current vital signs: BP (!) 142/76   Pulse 69   Temp 98.4 F (36.9 C)   Resp 13   Wt 81.1 kg   SpO2 100%   BMI 28.86 kg/m  Vital signs in last 24 hours: Temp:  [96.1 F (35.6 C)-99.5 F (37.5 C)] 98.4 F (36.9 C) (06/29 1000) Pulse Rate:  [48-99] 69 (06/29 1000) Resp:  [13-23] 13 (06/29 1000) BP: (81-185)/(57-167) 142/76 (06/29 1000) SpO2:  [91 %-100 %] 100 % (06/29 1000) Arterial Line BP: (100-230)/(49-224) 159/76 (06/29 1000) FiO2 (%):  [40 %] 40 % (06/29 0800) Weight:  [81.1 kg] 81.1 kg (06/29 0500)  Physical Exam  Constitutional: intubated, NAD. Shivering noted.  Eyes: R conjunctival injection +  chemosis HENT: ETT in place Cardiovascular: RRR, HR 95, SBP in 170-180s Respiratory: mechanical ventilation  Neuro: Mental Status: Patient is with eyes open. Not responding to any commands. No attempts to communicate.  CN: Eyes- fixed midline gaze, 2 mm pupils, sluggishly reactive. Equivocal blink to threat, corneal reflexes intact. No oculocephalic/dolls reflex. Motor/Sensory- Increased flexor and extensor tone BUE. Withdraws to shoulder squeeze with BUE.  Decreased tone BLE. Withdraws to plantar stimulation BLE. Reflexes:Low-amplitude brisk reflexes throughout.  Cerebellar/Gait: Unable to assess  Labs I have reviewed labs in epic.  CBC    Component Value Date/Time   WBC 11.0 (H) 11/15/2018 0338   RBC 4.90 11/15/2018 0338   HGB 14.2 11/15/2018 0338   HGB 14.5 10/23/2017 1046   HCT 41.7 11/15/2018 0338   HCT 40.9 10/23/2017 1046   PLT 183 11/15/2018 0338   PLT 274 10/23/2017 1046   MCV 85.1 11/15/2018 0338   MCV 84 10/23/2017 1046   MCH 29.0 11/15/2018 0338   MCHC 34.1 11/15/2018  0338   RDW 13.5 11/15/2018 0338   RDW 13.5 10/23/2017 1046   LYMPHSABS 3.7 11/02/2018 1513   LYMPHSABS 1.6 10/23/2017 1046   MONOABS 0.2 10/27/2018 1513   EOSABS 0.2 11/10/2018 1513   EOSABS 0.1 10/23/2017 1046   BASOSABS 0.1 10/27/2018 1513   BASOSABS 0.1 10/23/2017 1046    CMP     Component Value Date/Time   NA 142 11/15/2018 0338   NA 135 10/23/2017 1046   K 3.2 (L) 11/15/2018 0338   CL 111 11/15/2018 0338   CO2 23 11/15/2018 0338   GLUCOSE 81 11/15/2018 0338   BUN 13 11/15/2018 0338   BUN 17 10/23/2017 1046   CREATININE 0.88 11/15/2018 0338   CREATININE 0.92 006/30/202017 0816   CALCIUM 8.4 (L) 11/15/2018 0338   PROT 6.6 10/26/2018 1513   PROT 7.3 11/23/2017 1155   ALBUMIN 3.7 10/19/2018 1513   ALBUMIN 4.0 11/23/2017 1155   AST 142 (H) 11/06/2018 1513   ALT 117 (H) 10/20/2018 1513   ALKPHOS 116 11/03/2018 1513   BILITOT 0.5 11/15/2018 1513   BILITOT 0.3 11/23/2017 1155    GFRNONAA >60 11/15/2018 0338   GFRAA >60 11/15/2018 0338   Imaging I have reviewed the images obtained: CT-scan of the brain  Assessment: Havard Radigan is a 48 yo M with complex cardiovascular history who experienced cardiac arrest and is now unresponsive.  1. Most likely with anoxic brain injury.  2. He does have semi-purposeful movements, which somewhat improves the neurological prognosis, but prognosis for a full neurological recovery is uncertain  Recommendations: - MRI brain without contrast - Continue current ICU tx plan - Neuro team will continue to follow with you  Posey Pronto, PA-C [facility]  I have seen and examined the patient. I have formulated the assessment and plan. 48 year old male with probable anoxic brain injury. Neurological exam reveals several deficits. Plan is to obtain MRI brain and continue supportive care.  Electronically signed: Dr. Kerney Elbe

## 2018-11-15 NOTE — Progress Notes (Signed)
Alerted elink of arctic sun pad temperature and pt's temp. Requested tylenol. Asked to call back if it doesn't improve

## 2018-11-16 ENCOUNTER — Inpatient Hospital Stay (HOSPITAL_COMMUNITY): Payer: BC Managed Care – PPO

## 2018-11-16 ENCOUNTER — Ambulatory Visit: Payer: BC Managed Care – PPO | Admitting: Internal Medicine

## 2018-11-16 ENCOUNTER — Telehealth: Payer: Self-pay | Admitting: Internal Medicine

## 2018-11-16 DIAGNOSIS — I10 Essential (primary) hypertension: Secondary | ICD-10-CM

## 2018-11-16 DIAGNOSIS — J9601 Acute respiratory failure with hypoxia: Secondary | ICD-10-CM

## 2018-11-16 LAB — GLUCOSE, CAPILLARY
Glucose-Capillary: 103 mg/dL — ABNORMAL HIGH (ref 70–99)
Glucose-Capillary: 107 mg/dL — ABNORMAL HIGH (ref 70–99)
Glucose-Capillary: 137 mg/dL — ABNORMAL HIGH (ref 70–99)
Glucose-Capillary: 75 mg/dL (ref 70–99)
Glucose-Capillary: 79 mg/dL (ref 70–99)
Glucose-Capillary: 93 mg/dL (ref 70–99)
Glucose-Capillary: 99 mg/dL (ref 70–99)

## 2018-11-16 LAB — BASIC METABOLIC PANEL
Anion gap: 5 (ref 5–15)
BUN: 11 mg/dL (ref 6–20)
CO2: 28 mmol/L (ref 22–32)
Calcium: 8.5 mg/dL — ABNORMAL LOW (ref 8.9–10.3)
Chloride: 108 mmol/L (ref 98–111)
Creatinine, Ser: 0.72 mg/dL (ref 0.61–1.24)
GFR calc Af Amer: >60 mL/min (ref >60)
GFR calc non Af Amer: >60 mL/min (ref >60)
Glucose, Bld: 115 mg/dL — ABNORMAL HIGH (ref 70–99)
Potassium: 3.6 mmol/L (ref 3.5–5.1)
Sodium: 141 mmol/L (ref 135–145)

## 2018-11-16 LAB — CBC
HCT: 39.2 % (ref 39.0–52.0)
Hemoglobin: 13.4 g/dL (ref 13.0–17.0)
MCH: 29.1 pg (ref 26.0–34.0)
MCHC: 34.2 g/dL (ref 30.0–36.0)
MCV: 85 fL (ref 80.0–100.0)
Platelets: 188 10*3/uL (ref 150–400)
RBC: 4.61 MIL/uL (ref 4.22–5.81)
RDW: 13.7 % (ref 11.5–15.5)
WBC: 10.3 10*3/uL (ref 4.0–10.5)
nRBC: 0 % (ref 0.0–0.2)

## 2018-11-16 LAB — MAGNESIUM: Magnesium: 2.2 mg/dL (ref 1.7–2.4)

## 2018-11-16 LAB — PHOSPHORUS
Phosphorus: 2.1 mg/dL — ABNORMAL LOW (ref 2.5–4.6)
Phosphorus: 2 mg/dL — ABNORMAL LOW (ref 2.5–4.6)

## 2018-11-16 MED ORDER — ACETAMINOPHEN 160 MG/5ML PO SOLN
650.0000 mg | ORAL | Status: DC | PRN
  Administered 2018-11-16 – 2018-11-17 (×3): 650 mg
  Filled 2018-11-16 (×3): qty 20.3

## 2018-11-16 NOTE — Telephone Encounter (Signed)
New Message   Patients wife is calling because he is currently admitted to the hospital. She states that she spoke with a doctor from the hospital and she did not understand what he was saying.  Please call to discuss. She is just needing some clarification.

## 2018-11-16 NOTE — Progress Notes (Signed)
Patient transported on vent to MRI and back to 3H-43 without complication.

## 2018-11-16 NOTE — Telephone Encounter (Signed)
Spoke with pt's wife. Explained to her our process. She voiced understanding.

## 2018-11-16 NOTE — Progress Notes (Signed)
RT Bag lavaged pt d/t inability to pass suction catheter. Pt had several mucus plugs and secretions. RT able to pass catheter after bag lavage.Changed HME and [place] suction catheter. RT will continue to monitor.

## 2018-11-16 NOTE — Progress Notes (Signed)
Progress Note  Patient Name: Tony Reynolds Date of Encounter: 11/16/2018  Primary Cardiologist: Virl Axe, MD   Subjective   Pt is sedated on the vent  Inpatient Medications    Scheduled Meds: . aspirin  81 mg Per Tube Daily  . atorvastatin  20 mg Per Tube q1800  . chlorhexidine gluconate (MEDLINE KIT)  15 mL Mouth Rinse BID  . Chlorhexidine Gluconate Cloth  6 each Topical Daily  . Chlorhexidine Gluconate Cloth  6 each Topical Daily  . famotidine  20 mg Per Tube BID  . feeding supplement (PRO-STAT SUGAR FREE 64)  30 mL Per Tube BID  . heparin  5,000 Units Subcutaneous Q8H  . insulin aspart  0-15 Units Subcutaneous Q4H  . mouth rinse  15 mL Mouth Rinse 10 times per day  . metoprolol tartrate  5 mg Intravenous Q6H  . rocuronium  50 mg Intravenous Once  . sodium chloride flush  10-40 mL Intracatheter Q12H   Continuous Infusions: . sodium chloride Stopped (11/16/18 0449)  . dextrose 5 % and 0.9% NaCl Stopped (11/16/18 0449)  . feeding supplement (VITAL 1.5 CAL) 1,000 mL (11/15/18 1349)  . fentaNYL infusion INTRAVENOUS 150 mcg/hr (11/16/18 0535)  . midazolam 4 mg/hr (11/16/18 0536)  . norepinephrine (LEVOPHED) Adult infusion Stopped (11/14/18 1130)  . propofol (DIPRIVAN) infusion Stopped (11/14/18 0947)   PRN Meds: fentaNYL, hydrALAZINE, midazolam, midazolam, sodium chloride flush   Vital Signs    Vitals:   11/16/18 0400 11/16/18 0500 11/16/18 0600 11/16/18 0700  BP: (!) 145/93   125/77  Pulse: 84  87 70  Resp: _0 Temp:      TempSrc:      SpO2: 100%  100% 100%  Weight:  80.1 kg      Intake/Output Summary (Last 24 hours) at 11/16/2018 0810 Last data filed at 11/16/2018 0700 Gross per 24 hour  Intake 2546.26 ml  Output 3350 ml  Net -803.74 ml   Last 3 Weights 11/16/2018 11/15/2018 11/14/2018  Weight (lbs) 176 lb 9.8 oz 178 lb 12.7 oz 176 lb 9.4 oz  Weight (kg) 80.11 kg 81.1 kg 80.1 kg      Telemetry    sinus - Personally Reviewed   Physical  Exam   General: Not responsive SKIN: warm, dry. No rashes. Musculoskeletal: Muscle strength 5/5 all ext  Psychiatric: sedated Neck: No JVD Lungs:Clear bilaterally, no wheezes, rhonci, crackles Cardiovascular: Regular rate and rhythm. No murmurs, gallops or rubs. Abdomen:Soft. Bowel sounds present. Non-tender.  Extremities: Trace bilateral lower extremity edema. Pulses are 2 + in the bilateral DP/PT.    Labs    High Sensitivity Troponin:   Recent Labs  Lab 11/03/2018 1201 10/27/2018 1513 10/22/2018 1632 10/28/2018 1850 11/08/2018 2212  TROPONINIHS 29.00* 86* 620* 1,567* 2,776*      Cardiac EnzymesNo results for input(s): TROPONINI in the last 168 hours. No results for input(s): TROPIPOC in the last 168 hours.   Chemistry Recent Labs  Lab 10/23/2018 1009 10/30/2018 1513  11/14/18 0533  11/14/18 0834  11/14/18 2159 11/15/18 0338 11/16/18 0340  NA 142 139   < > 141   < > 143   < > 146* 142 141  K 3.9 3.2*   < > 3.8   < > 3.5   < > 4.1 3.2* 3.6  CL 108 107   < > 115*  --  116*  --   --  111 108  CO2 25 15*  --  16*  --   --   --   --  23 28  GLUCOSE 96 241*   < > 127*  --  111*   < > 76 81 115*  BUN 11 13   < > 22*  --  21*  --   --  13 11  CREATININE 0.92 1.73*   < > 1.00  --  0.80  --   --  0.88 0.72  CALCIUM 9.5 8.8*  --  8.0*  --   --   --   --  8.4* 8.5*  PROT 8.0 6.6  --   --   --   --   --   --   --   --   ALBUMIN 4.5 3.7  --   --   --   --   --   --   --   --   AST 35 142*  --   --   --   --   --   --   --   --   ALT 27 117*  --   --   --   --   --   --   --   --   ALKPHOS 89 116  --   --   --   --   --   --   --   --   BILITOT 0.5 0.5  --   --   --   --   --   --   --   --   GFRNONAA >60 47*  --  >60  --   --   --   --  >60 >60  GFRAA >60 54*  --  >60  --   --   --   --  >60 >60  ANIONGAP 9 17*  --  10  --   --   --   --  8 5   < > = values in this interval not displayed.     Hematology Recent Labs  Lab 11/14/18 0533  11/14/18 2159 11/15/18 0338 11/16/18 0340   WBC 11.1*  --   --  11.0* 10.3  RBC 4.97  --   --  4.90 4.61  HGB 14.5   < > 14.3 14.2 13.4  HCT 41.8   < > 42.0 41.7 39.2  MCV 84.1  --   --  85.1 85.0  MCH 29.2  --   --  29.0 29.1  MCHC 34.7  --   --  34.1 34.2  RDW 13.0  --   --  13.5 13.7  PLT 218  --   --  183 188   < > = values in this interval not displayed.    BNP Recent Labs  Lab 11/11/2018 1009  BNP 128.0*     DDimer No results for input(s): DDIMER in the last 168 hours.   Radiology    Mr Brain Wo Contrast  Result Date: 11/16/2018 CLINICAL DATA:  Anoxic brain injury EXAM: MRI HEAD WITHOUT CONTRAST TECHNIQUE: Multiplanar, multiecho pulse sequences of the brain and surrounding structures were obtained without intravenous contrast. COMPARISON:  Head CT from 3 days ago FINDINGS: Brain: Restricted diffusion in the bilateral caudate. 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 These findings are consistent with global anoxic injury. Possible low-grade edema along the cortex diffusely and that there is a degree of elevated intracranial pressure-sulci are diffusely effaced. There is increased FLAIR signal the bilateral medial thalamus without restricted  diffusion. No definite abnormal signal in the brainstem. No acute hemorrhage, hydrocephalus, or midline shift. Vascular: Major intracranial flow voids are preserved Skull and upper cervical spine: Negative for marrow lesion Sinuses/Orbits: Sinus and nasal cavity/nasopharyngeal opacification in the setting of intubation. IMPRESSION: 1. 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 2. Bilateral medial thalamus swelling without restricted diffusion, has there been seizure activity? Also, suggest vitamin replacement given history of ethanol abuse. Electronically Signed   By: Monte Fantasia M.D.   On: 11/16/2018 05:52    Cardiac Studies   Echo: IMPRESSIONS    1. The left ventricle has normal systolic  function, with an ejection fraction of 55-60%. The cavity size was normal. There is moderately increased left ventricular wall thickness. Left ventricular diastolic parameters were normal.  2. The right ventricle has normal systolic function. The cavity was normal. There is no increase in right ventricular wall thickness.  3. No evidence of mitral valve stenosis.  4. The aortic valve is tricuspid. Mild thickening of the aortic valve. Mild calcification of the aortic valve. No stenosis of the aortic valve. Mild aortic annular calcification noted.  5. The aortic root is normal in size and structure.  6. Pulmonary hypertension is indeterminate, inadequate TR jet  Patient Profile     48 y.o. male with out of hospital ventricular fibrillation arrest.   Assessment & Plan    1. Cardiac Arrest: He has a history of prior VF arrest with severe LV systolic dysfunction/NICM. He has had a SQ ICD removed due to pocket infection but lost to follow up after [employer]. Sinus this am. Await neurological recovery.   2. Bradycardia: Resolved  3. CAD: nonobstructive at last cath in 2019.  4. HTN: BP is better controlled this am.   5. Anoxic brain injury: Neuro following    For questions or updates, please contact [place] Please consult [email or web address] for contact info under        Signed, Lauree Chandler, MD  11/16/2018, 8:10 AM

## 2018-11-16 NOTE — Progress Notes (Signed)
NAME:  Tony MaserLeonard J Flenniken, MRN:  [id number], DOB:  [birth year], LOS: 3 ADMISSION DATE:  11/16/2018, CONSULTATION DATE:  11/14/2018 REFERRING MD:  ER, CHIEF COMPLAINT:  Arrest   Brief History   48 year old man with mixed ischemic/nonischemic cardiomyopathy, prior Vfib arrest presenting with Vfib arrest. Hypothermia protocol-cooled to 33 but became bradycardic Cooling goals was changed Rewarmed-goal achieved about 9 PM 11/14/2018  History of present illness   48 year old smoker with history of Vfib arrest presenting with another Vfib arrest.  History per Dr. Corky SingHenry Smith's note and confirmed with ER staff: "He has had prior cardiac arrest in December 2018.  Had nonobstructive coronary disease at that time with reduction in LV function.  Subsequently had AICD implantation in the subcutaneous space in January 2019.  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  Patient presented to emergency room yesterday with an episode of syncope but signed out AMA.  He had a "seizure" today with collapse.  CPR was not provided by bystanders, EMS arrived 6 to 7 minutes after collapse.  At least 6 minutes of CPR was provided with 2 epinephrine and defibrillation from ventricular fibrillation--> asystole--> SVT/A. Fib--> sinus rhythm."  Intubated in field.  Did not fully wake up but was moving extremities and now is sedated. Intensivist service has been asked to admit.  Past Medical History  Nonischemic cardiomyopathy although has a pretty good LAD lesion I guess with TIMI 3 flow so thought noncontributory.  Smoker  Prior Vfib arrest  Prior AICD complicated by infection and explantation  Significant Hospital Events   11/16/2018 Admission  Consults:  11/02/2018 Cardiology and hopefully EP  Procedures:  10/25/2018 Intubation in field  Significant Diagnostic Tests:  11/03/2018 CT Head:>>>neg acute  MRI brain 6/30>>> 1. 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 2. Bilateral medial thalamus swelling  without restricted diffusion, has there been seizure activity? Also, suggest vitamin replacement given history of ethanol abuse.  Micro Data:  11/11/2018: COVID negative 10/27/2018: HIV screen negative  Antimicrobials:  None   Interim history/subjective:  MRI brain done overnight  No acute changes  Mild shivering, some hyperreflexia tol PS 10/5   Objective   Blood pressure 132/80, pulse 71, temperature 98.8 F (37.1 C), temperature source Bladder, resp. rate 12, weight 80.1 kg, SpO2 100 %. CVP:  [6 mmHg-17 mmHg] 13 mmHg  Vent Mode: PSV;CPAP FiO2 (%):  [40 %] 40 % Set Rate:  [18 bmp] 18 bmp Vt Set:  [510 mL] 510 mL PEEP:  [5 cmH20] 5 cmH20 Pressure Support:  [10 cmH20] 10 cmH20 Plateau Pressure:  [19 cmH20-22 cmH20] 19 cmH20   Intake/Output Summary (Last 24 hours) at 11/16/2018 0847 Last data filed at 11/16/2018 0700 Gross per 24 hour  Intake 2546.26 ml  Output 3350 ml  Net -803.74 ml   Filed Weights   11/14/18 0500 11/15/18 0500 11/16/18 0500  Weight: 80.1 kg 81.1 kg 80.1 kg    Examination:  General:  Young male, NAD on vent HEENT: MM pink/moist Neuro: observed neurologist exam - hyper reflexive L>R, increased tone, no dolls eyes, no gag, remains on low dose fent/versed for shivering, pupils 2mm sluggish CV: s1s2 rrr, no m/r/g PULM: even/non-labored, lungs bilaterally coarse, tol PS 10/5 with Vt ~500 NW:GNFAGI:soft, non-tender, bsx4 active  Extremities: warm/dry, scant BLE edema  Skin: no rashes or lesions  Resolved Hospital Problem list   None  Assessment & Plan:   Ventricular fibrillation, status post cardioversion, about 15 minutes before return of  spontaneous circulation in about 7 minutes downtime prior to initiation of CPR Acute on chronic systolic heart failure, prior cardiac catheterization January 2019-nonobstructive coronary artery disease, 50% mid LAD, 75% diagonal - continue normothermia -Now rewarmed -Continue Versed -cardiology following  -likely poor  neurologic prognosis as below   Hypertension -continue metoprolol  -Cardiology following -Bradycardia resolved  Hyperchloremia likely from fluid resuscitation - resolved  -Continue to monitor  Encephalopathy Anoxic brian injury  -EEG is suggestive of hypoxemic injury -MRI with global anoxic injury  -Neurology following - discussed with Dr. Cheral Marker at bedside who suspects poor neuro prognosis   Lactic acidosis, improving -Trend electrolytes -Continue to monitor closely  Hypoxemic respiratory failure -Continue ventilator support -Tolerating pressure support ventilation - mental status does not support extubation   Elevated troponin -Likely related to demand ischemia from cardiac arrest -Continue to monitor -Cardiology following  Hypoglycemia - improved  -continue D5 half-normal saline  Best practice:  Diet: TF Pain/Anxiety/Delirium protocol (if indicated): As needed VAP protocol (if indicated): ordered 6/27 DVT prophylaxis: Lovenox GI prophylaxis: Pepcid Glucose control: SSI Mobility: Bedrest Code Status: full Family Communication: will call family to discuss neuro prognosis and goals of care  Disposition: ICU  Labs   CBC: Recent Labs  Lab 2018-11-23 1009 10/30/2018 1513  11/01/2018 2212  11/14/18 0533  11/14/18 1817 11/14/18 2040 11/14/18 2159 11/15/18 0338 11/16/18 0340  WBC 6.7 8.0  --  12.3*  --  11.1*  --   --   --   --  11.0* 10.3  NEUTROABS 4.4 3.9  --   --   --   --   --   --   --   --   --   --   HGB 15.2 15.1   < > 15.5   < > 14.5   < > 13.9 15.3 14.3 14.2 13.4  HCT 44.7 44.4   < > 44.5   < > 41.8   < > 41.0 45.0 42.0 41.7 39.2  MCV 85.3 85.9  --  84.4  --  84.1  --   --   --   --  85.1 85.0  PLT 205 240  --  238  --  218  --   --   --   --  183 188   < > = values in this interval not displayed.    Basic Metabolic Panel: Recent Labs  Lab 11-23-2018 1009 10/19/2018 1513  11/14/18 0111 11/14/18 0533  11/14/18 0834  11/14/18 1817 11/14/18 2040  11/14/18 2159 11/15/18 0338 11/15/18 1526 11/16/18 0340  NA 142 139   < > 144 141   < > 143   < > 146* 146* 146* 142  --  141  K 3.9 3.2*   < > 3.5 3.8   < > 3.5   < > 3.9 3.9 4.1 3.2*  --  3.6  CL 108 107   < > 115* 115*  --  116*  --   --   --   --  111  --  108  CO2 25 15*  --   --  16*  --   --   --   --   --   --  23  --  28  GLUCOSE 96 241*   < > 120* 127*  --  111*   < > 86 77 76 81  --  115*  BUN 11 13   < > 23* 22*  --  21*  --   --   --   --  13  --  11  CREATININE 0.92 1.73*   < > 0.90 1.00  --  0.80  --   --   --   --  0.88  --  0.72  CALCIUM 9.5 8.8*  --   --  8.0*  --   --   --   --   --   --  8.4*  --  8.5*  MG 2.1 2.0  --   --  2.5*  --   --   --   --   --   --  1.9  --  2.2  PHOS  --   --   --   --   --   --   --   --   --   --   --   --  3.2 2.1*   < > = values in this interval not displayed.   GFR: Estimated Creatinine Clearance: 115.9 mL/min (by C-G formula based on SCr of 0.72 mg/dL). Recent Labs  Lab 11/15/2018 1632 10/23/2018 1850 10/22/2018 2212 11/14/18 0533 11/15/18 0338 11/16/18 0340  WBC  --   --  12.3* 11.1* 11.0* 10.3  LATICACIDVEN 3.3* 3.0*  --   --   --   --     Liver Function Tests: Recent Labs  Lab 10/19/2018 1009 10/29/2018 1513  AST 35 142*  ALT 27 117*  ALKPHOS 89 116  BILITOT 0.5 0.5  PROT 8.0 6.6  ALBUMIN 4.5 3.7   Recent Labs  Lab 11/11/2018 1513  LIPASE 42   No results for input(s): AMMONIA in the last 168 hours.  ABG    Component Value Date/Time   PHART 7.381 11/14/2018 0541   PCO2ART 25.6 (L) 11/14/2018 0541   PO2ART 153.0 (H) 11/14/2018 0541   HCO3 16.0 (L) 11/14/2018 0541   TCO2 18 (L) 11/14/2018 0834   ACIDBASEDEF 9.0 (H) 11/14/2018 0541   O2SAT 99.0 11/14/2018 0541     Coagulation Profile: Recent Labs  Lab 11/08/2018 1850 11/14/18 0221  INR 1.0 1.0    Cardiac Enzymes: No results for input(s): CKTOTAL, CKMB, CKMBINDEX, TROPONINI in the last 168 hours.  HbA1C: Hgb A1c MFr Bld  Date/Time Value Ref Range Status   11/02/2018 04:32 PM 5.7 (H) 4.8 - 5.6 % Final    Comment:    REPEATED TO VERIFY (NOTE) Pre diabetes:          5.7%-6.4% Diabetes:              >6.4% Glycemic control for   <7.0% adults with diabetes     CBG: Recent Labs  Lab 11/15/18 1528 11/15/18 1921 11/15/18 2320 11/16/18 0332 11/16/18 0721  GLUCAP 97 108* 104* 103* 79    Review of Systems:   Cannot assess- comatose.  Past Medical History  He,  has a past medical history of Arthritis, Back pain, Eczema, Hypertension, Lumbar disc herniation, and MI (myocardial infarction) (HCC) (2018).   Surgical History    Past Surgical History:  Procedure Laterality Date  . BILATERAL ANTERIOR TOTAL HIP ARTHROPLASTY Bilateral 12/14/2015   Procedure: BILATERAL ANTERIOR TOTAL HIP ARTHROPLASTY;  Surgeon: Kathryne Hitch, MD;  Location: [facility];  Service: Orthopedics;  Laterality: Bilateral;  . CORONARY/GRAFT ACUTE MI REVASCULARIZATION N/A 05/19/2017   Procedure: Coronary/Graft Acute MI Revascularization;  Surgeon: Tonny Bollman, MD;  Location: [facility] INVASIVE CV LAB;  Service: Cardiovascular;  Laterality: N/A;  . ICD GENERATOR REMOVAL N/A 11/09/2017   Procedure: ICD GENERATOR REMOVAL;  Surgeon: Duke Salvia,  MD;  Location: [facility] INVASIVE CV LAB;  Service: Cardiovascular;  Laterality: N/A;  . LEFT HEART CATH AND CORONARY ANGIOGRAPHY N/A 05/19/2017   Procedure: LEFT HEART CATH AND CORONARY ANGIOGRAPHY;  Surgeon: Tonny Bollmanooper, Michael, MD;  [facility];  Service: Cardiovascular;  Laterality: N/A;  . NO PAST SURGERIES    . POCKET REVISION/RELOCATION N/A 05/06/2018   Procedure: POCKET REVISION;  Surgeon: Marinus Mawaylor, Gregg W, MD;  [facility];  Service: Cardiovascular;  Laterality: N/A;  . SUBQ ICD IMPLANT N/A 07/17/2017   Procedure: SUBQ ICD IMPLANT;  Surgeon: Duke SalviaKlein, Steven C, MD;  [facility];  Service: Cardiovascular;  Laterality: N/A;     Social History   reports that he has been smoking cigarettes.  He started smoking about 29 years ago. He has been smoking about 0.50 packs per day. He has never used smokeless tobacco. He reports previous alcohol use. He reports that he does not use drugs.   Family History   His family history is not on file.   Allergies Allergies  Allergen Reactions  . Tomato Other (See Comments)    No acidic foods - eczema   The patient is critically ill with multiple organ system failure and requires high complexity decision making for assessment and support, frequent evaluation and titration of therapies, advanced monitoring, review of radiographic studies and interpretation of complex data.    Critical Care Time devoted to patient care services, exclusive of separately billable procedures, described in this note is 30 minutes.   Dirk DressKaty Whiteheart, NP 11/16/2018  8:47 AM Pager: [phone]) [phone] or [phone]) [phone]

## 2018-11-16 NOTE — Progress Notes (Signed)
eLink Physician-Brief Progress Note Patient Name: Tony Reynolds DOB: 1970/06/28 MRN: [id number]   Date of Service  11/16/2018  HPI/Events of Note  Fever  eICU Interventions  Prn tylenol order entered        Frederik Pear 11/16/2018, 9:13 PM

## 2018-11-16 NOTE — Telephone Encounter (Signed)
I spoke with wife.She states she will speak with patients nurse this evening.I encouraged her to also ask to speak with a Education officer, museum also.She agrees to this

## 2018-11-16 NOTE — Progress Notes (Signed)
Subjective: Shivering still present but decreased from yesterday. Continues to be unresponsive to external stimuli.   Objective: Current vital signs: BP 125/77   Pulse 70   Temp 98.8 F (37.1 C) (Bladder)   Resp 18   Wt 80.1 kg   SpO2 100%   BMI 28.51 kg/m  Vital signs in last 24 hours: Temp:  [98.4 F (36.9 C)-99.9 F (37.7 C)] 98.8 F (37.1 C) (06/30 0300) Pulse Rate:  [69-100] 70 (06/30 0700) Resp:  [12-28] 18 (06/30 0700) BP: (125-180)/(62-116) 125/77 (06/30 0700) SpO2:  [93 %-100 %] 100 % (06/30 0700) Arterial Line BP: (134-188)/(66-104) 134/80 (06/30 0700) FiO2 (%):  [40 %] 40 % (06/30 0400) Weight:  [80.1 kg] 80.1 kg (06/30 0500)  Intake/Output from previous day: 06/29 0701 - 06/30 0700 In: 2651.6 [I.V.:2651.6] Out: 3550 [Urine:3350; Emesis/NG output:200] Intake/Output this shift: No intake/output data recorded. Nutritional status:  Diet Order            Diet NPO time specified  Diet effective now              Neuro: Mental Status: Eyes closed, does not open to stimulation. Not responding to any commands. No attempts to communicate.  CN: Eyes- fixed midline gaze, pinpoint pupils without detectable reactivity. No blink to threat. No oculocephalic/dolls reflex. Motor/Sensory- Moderate to severely increased flexor and extensor tone BUE. Mild to moderately increased tone BLE. Increased tone is worse on the left.  Reflexes: Low-amplitude pathologically brisk reflexes throughout, worse to upper extremities and worse on the left.  Cerebellar/Gait: Unable to assess  Lab Results: Results for orders placed or performed during the hospital encounter of 10/23/2018 (from the past 48 hour(s))  I-STAT, chem 8     Status: Abnormal   Collection Time: 11/14/18  8:34 AM  Result Value Ref Range   Sodium 143 135 - 145 mmol/L   Potassium 3.5 3.5 - 5.1 mmol/L   Chloride 116 (H) 98 - 111 mmol/L   BUN 21 (H) 6 - 20 mg/dL   Creatinine, Ser 0.80 0.61 - 1.24 mg/dL   Glucose, Bld  111 (H) 70 - 99 mg/dL   Calcium, Ion 1.12 (L) 1.15 - 1.40 mmol/L   TCO2 18 (L) 22 - 32 mmol/L   Hemoglobin 13.6 13.0 - 17.0 g/dL   HCT 40.0 39.0 - 52.0 %  I-STAT 4, (NA,K, GLUC, HGB,HCT)     Status: Abnormal   Collection Time: 11/14/18 10:20 AM  Result Value Ref Range   Sodium 146 (H) 135 - 145 mmol/L   Potassium 3.2 (L) 3.5 - 5.1 mmol/L   Glucose, Bld 89 70 - 99 mg/dL   HCT 39.0 39.0 - 52.0 %   Hemoglobin 13.3 13.0 - 17.0 g/dL  I-STAT 4, (NA,K, GLUC, HGB,HCT)     Status: Abnormal   Collection Time: 11/14/18 12:10 PM  Result Value Ref Range   Sodium 146 (H) 135 - 145 mmol/L   Potassium 3.3 (L) 3.5 - 5.1 mmol/L   Glucose, Bld 64 (L) 70 - 99 mg/dL   HCT 38.0 (L) 39.0 - 52.0 %   Hemoglobin 12.9 (L) 13.0 - 17.0 g/dL  Glucose, capillary     Status: None   Collection Time: 11/14/18 12:59 PM  Result Value Ref Range   Glucose-Capillary 95 70 - 99 mg/dL  I-STAT 4, (NA,K, GLUC, HGB,HCT)     Status: Abnormal   Collection Time: 11/14/18  2:26 PM  Result Value Ref Range   Sodium 147 (H) 135 - 145  mmol/L   Potassium 3.8 3.5 - 5.1 mmol/L   Glucose, Bld 82 70 - 99 mg/dL   HCT 39.0 39.0 - 52.0 %   Hemoglobin 13.3 13.0 - 17.0 g/dL  I-STAT 4, (NA,K, GLUC, HGB,HCT)     Status: Abnormal   Collection Time: 11/14/18  4:10 PM  Result Value Ref Range   Sodium 146 (H) 135 - 145 mmol/L   Potassium 3.8 3.5 - 5.1 mmol/L   Glucose, Bld 80 70 - 99 mg/dL   HCT 39.0 39.0 - 52.0 %   Hemoglobin 13.3 13.0 - 17.0 g/dL  I-STAT 4, (NA,K, GLUC, HGB,HCT)     Status: Abnormal   Collection Time: 11/14/18  6:17 PM  Result Value Ref Range   Sodium 146 (H) 135 - 145 mmol/L   Potassium 3.9 3.5 - 5.1 mmol/L   Glucose, Bld 86 70 - 99 mg/dL   HCT 41.0 39.0 - 52.0 %   Hemoglobin 13.9 13.0 - 17.0 g/dL  Glucose, capillary     Status: None   Collection Time: 11/14/18  7:44 PM  Result Value Ref Range   Glucose-Capillary 82 70 - 99 mg/dL  I-STAT 4, (NA,K, GLUC, HGB,HCT)     Status: Abnormal   Collection Time: 11/14/18   8:40 PM  Result Value Ref Range   Sodium 146 (H) 135 - 145 mmol/L   Potassium 3.9 3.5 - 5.1 mmol/L   Glucose, Bld 77 70 - 99 mg/dL   HCT 45.0 39.0 - 52.0 %   Hemoglobin 15.3 13.0 - 17.0 g/dL  I-STAT 4, (NA,K, GLUC, HGB,HCT)     Status: Abnormal   Collection Time: 11/14/18  9:59 PM  Result Value Ref Range   Sodium 146 (H) 135 - 145 mmol/L   Potassium 4.1 3.5 - 5.1 mmol/L   Glucose, Bld 76 70 - 99 mg/dL   HCT 42.0 39.0 - 52.0 %   Hemoglobin 14.3 13.0 - 17.0 g/dL  Glucose, capillary     Status: Abnormal   Collection Time: 11/14/18 11:15 PM  Result Value Ref Range   Glucose-Capillary 69 (L) 70 - 99 mg/dL  Glucose, capillary     Status: None   Collection Time: 11/14/18 11:49 PM  Result Value Ref Range   Glucose-Capillary 94 70 - 99 mg/dL  Basic metabolic panel     Status: Abnormal   Collection Time: 11/15/18  3:38 AM  Result Value Ref Range   Sodium 142 135 - 145 mmol/L   Potassium 3.2 (L) 3.5 - 5.1 mmol/L    Comment: NO VISIBLE HEMOLYSIS   Chloride 111 98 - 111 mmol/L   CO2 23 22 - 32 mmol/L   Glucose, Bld 81 70 - 99 mg/dL   BUN 13 6 - 20 mg/dL   Creatinine, Ser 0.88 0.61 - 1.24 mg/dL   Calcium 8.4 (L) 8.9 - 10.3 mg/dL   GFR calc non Af Amer >60 >60 mL/min   GFR calc Af Amer >60 >60 mL/min   Anion gap 8 5 - 15    Comment: Performed at [facility], [place] [address] [place], [place] [address]  Magnesium     Status: None   Collection Time: 11/15/18  3:38 AM  Result Value Ref Range   Magnesium 1.9 1.7 - 2.4 mg/dL    Comment: Performed at [place] [address] [place], [place] [address]  CBC     Status: Abnormal   Collection Time: 11/15/18  3:38 AM  Result Value Ref  Range   WBC 11.0 (H) 4.0 - 10.5 K/uL   RBC 4.90 4.22 - 5.81 MIL/uL   Hemoglobin 14.2 13.0 - 17.0 g/dL   HCT 41.7 39.0 - 52.0 %   MCV 85.1 80.0 - 100.0 fL   MCH 29.0 26.0 - 34.0 pg   MCHC 34.1 30.0 - 36.0 g/dL   RDW 13.5 11.5 - 15.5 %   Platelets 183 150 - 400 K/uL   nRBC 0.0 0.0 - 0.2 %     Comment: Performed at [facility], [place] [address] [place], [place] [address]  Glucose, capillary     Status: None   Collection Time: 11/15/18  3:40 AM  Result Value Ref Range   Glucose-Capillary 72 70 - 99 mg/dL  Glucose, capillary     Status: None   Collection Time: 11/15/18  7:29 AM  Result Value Ref Range   Glucose-Capillary 75 70 - 99 mg/dL  Glucose, capillary     Status: None   Collection Time: 11/15/18 11:38 AM  Result Value Ref Range   Glucose-Capillary 94 70 - 99 mg/dL  Phosphorus     Status: None   Collection Time: 11/15/18  3:26 PM  Result Value Ref Range   Phosphorus 3.2 2.5 - 4.6 mg/dL    Comment: Performed at [facility], [place] [address], [place], [place] [address]  Glucose, capillary     Status: None   Collection Time: 11/15/18  3:28 PM  Result Value Ref Range   Glucose-Capillary 97 70 - 99 mg/dL  Glucose, capillary     Status: Abnormal   Collection Time: 11/15/18  7:21 PM  Result Value Ref Range   Glucose-Capillary 108 (H) 70 - 99 mg/dL  Glucose, capillary     Status: Abnormal   Collection Time: 11/15/18 11:20 PM  Result Value Ref Range   Glucose-Capillary 104 (H) 70 - 99 mg/dL  Glucose, capillary     Status: Abnormal   Collection Time: 11/16/18  3:32 AM  Result Value Ref Range   Glucose-Capillary 103 (H) 70 - 99 mg/dL  Basic metabolic panel     Status: Abnormal   Collection Time: 11/16/18  3:40 AM  Result Value Ref Range   Sodium 141 135 - 145 mmol/L   Potassium 3.6 3.5 - 5.1 mmol/L   Chloride 108 98 - 111 mmol/L   CO2 28 22 - 32 mmol/L   Glucose, Bld 115 (H) 70 - 99 mg/dL   BUN 11 6 - 20 mg/dL   Creatinine, Ser 0.72 0.61 - 1.24 mg/dL   Calcium 8.5 (L) 8.9 - 10.3 mg/dL   GFR calc non Af Amer >60 >60 mL/min   GFR calc Af Amer >60 >60 mL/min   Anion gap 5 5 - 15    Comment: Performed at [facility], [place] [address] [place], [place] [address]  Magnesium     Status: None   Collection Time: 11/16/18  3:40 AM  Result Value Ref  Range   Magnesium 2.2 1.7 - 2.4 mg/dL    Comment: Performed at [place] [address], [place] [address]  CBC     Status: None   Collection Time: 11/16/18  3:40 AM  Result Value Ref Range   WBC 10.3 4.0 - 10.5 K/uL   RBC 4.61 4.22 - 5.81 MIL/uL   Hemoglobin 13.4 13.0 - 17.0 g/dL   HCT 39.2 39.0 - 52.0 %   MCV 85.0 80.0 - 100.0 fL  MCH 29.1 26.0 - 34.0 pg   MCHC 34.2 30.0 - 36.0 g/dL   RDW 13.7 11.5 - 15.5 %   Platelets 188 150 - 400 K/uL   nRBC 0.0 0.0 - 0.2 %    Comment: Performed at [facility], [place] [address], [place], [place] [address]  Phosphorus     Status: Abnormal   Collection Time: 11/16/18  3:40 AM  Result Value Ref Range   Phosphorus 2.1 (L) 2.5 - 4.6 mg/dL    Comment: Performed at [place] [address], [place], [place] [address]    Recent Results (from the past 240 hour(s))  Novel Coronavirus,NAA,(SEND-OUT TO REF LAB - TAT 24-48 hrs); Hosp Order     Status: None   Collection Time: 10/29/2018 11:13 AM   Specimen: Nasopharyngeal Swab; Respiratory  Result Value Ref Range Status   SARS-CoV-2, NAA NOT DETECTED NOT DETECTED Final    Comment: (NOTE) This test was developed and its performance characteristics determined by [employer]. This test has not been FDA cleared or approved. This test has been authorized by FDA under an Emergency Use Authorization (EUA). This test is only authorized for the duration of time the declaration that circumstances exist justifying the authorization of the emergency use of in vitro diagnostic tests for detection of SARS-CoV-2 virus and/or diagnosis of COVID-19 infection under section 564(b)(1) of the Act, 21 U.S.C. 401UUV-2(Z)(3), unless the authorization is terminated or revoked sooner. When diagnostic testing is negative, the possibility of a false negative result should be considered in the context of a patient's recent exposures and the presence of clinical signs and symptoms  consistent with COVID-19. An individual without symptoms of COVID-19 and who is not shedding SARS-CoV-2 virus would expect to have a negative (not detected) result in this assay. Performed  At: [facility] [address] [place], [place] [id number] Rush Farmer MD QV:[id number]    [place]  Final    Comment: Performed at [facility], [address], [place], [place] [address]  SARS Coronavirus 2 ([place] - Performed in [facility] hospital lab), Hosp Order     Status: None   Collection Time: 10/22/2018  3:34 PM   Specimen: Nasopharyngeal Swab  Result Value Ref Range Status   SARS Coronavirus 2 NEGATIVE NEGATIVE Final    Comment: (NOTE) If result is NEGATIVE SARS-CoV-2 target nucleic acids are NOT DETECTED. The SARS-CoV-2 RNA is generally detectable in upper and lower  respiratory specimens during the acute phase of infection. The lowest  concentration of SARS-CoV-2 viral copies this assay can detect is 250  copies / mL. A negative result does not preclude SARS-CoV-2 infection  and should not be used as the sole basis for treatment or other  patient management decisions.  A negative result may occur with  improper specimen collection / handling, submission of specimen other  than nasopharyngeal swab, presence of viral mutation(s) within the  areas targeted by this assay, and inadequate number of viral copies  (<250 copies / mL). A negative result must be combined with clinical  observations, patient history, and epidemiological information. If result is POSITIVE SARS-CoV-2 target nucleic acids are DETECTED. The SARS-CoV-2 RNA is generally detectable in upper and lower  respiratory specimens dur ing the acute phase of infection.  Positive  results are indicative of active infection with SARS-CoV-2.  Clinical  correlation with patient history and other diagnostic information is  necessary to determine patient infection status.  Positive results do  not rule out bacterial infection or co-infection with other viruses. If result is PRESUMPTIVE POSTIVE SARS-CoV-2 nucleic acids MAY BE PRESENT.   A presumptive positive result was obtained on the submitted specimen  and confirmed on repeat testing.  While 2019 novel coronavirus  (SARS-CoV-2) nucleic acids may be present in the submitted sample  additional confirmatory testing may be necessary for epidemiological  and / or clinical management purposes  to differentiate between  SARS-CoV-2 and other Sarbecovirus currently known to infect humans.  If clinically indicated additional testing with an alternate test  methodology [phone]) is advised. The SARS-CoV-2 RNA is generally  detectable in upper and lower respiratory sp ecimens during the acute  phase of infection. The expected result is Negative. Fact Sheet for Patients:  StrictlyIdeas.no Fact Sheet for Healthcare Providers: BankingDealers.co.za This test is not yet approved or cleared by the [place] FDA and has been authorized for detection and/or diagnosis of SARS-CoV-2 by FDA under an Emergency Use Authorization (EUA).  This EUA will remain in effect (meaning this test can be used) for the duration of the COVID-19 declaration under Section 564(b)(1) of the Act, 21 U.S.C. section 360bbb-3(b)(1), unless the authorization is terminated or revoked sooner. Performed at [facility], [place] [address], [place], [place] [address]   MRSA PCR Screening     Status: None   Collection Time: 10/29/2018  5:25 PM   Specimen: Nasal Mucosa; Nasopharyngeal  Result Value Ref Range Status   MRSA by PCR NEGATIVE NEGATIVE Final    Comment:        The GeneXpert MRSA Assay (FDA approved for NASAL specimens only), is one component of a comprehensive MRSA colonization surveillance program. It is not intended to diagnose MRSA infection nor to guide or monitor treatment for MRSA  infections. Performed at [facility], [place] [address] [place], [place] [address]     Lipid Panel No results for input(s): CHOL, TRIG, HDL, CHOLHDL, VLDL, LDLCALC in the last 72 hours.  Studies/Results: Mr Brain Wo Contrast  Result Date: 11/16/2018 CLINICAL DATA:  Anoxic brain injury EXAM: MRI HEAD WITHOUT CONTRAST TECHNIQUE: Multiplanar, multiecho pulse sequences of the brain and surrounding structures were obtained without intravenous contrast. COMPARISON:  Head CT from 3 days ago FINDINGS: Brain: Restricted diffusion in the bilateral caudate. 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 These findings are consistent with global anoxic injury. Possible low-grade edema along the cortex diffusely and that there is a degree of elevated intracranial pressure-sulci are diffusely effaced. There is increased FLAIR signal the bilateral medial thalamus without restricted diffusion. No definite abnormal signal in the brainstem. No acute hemorrhage, hydrocephalus, or midline shift. Vascular: Major intracranial flow voids are preserved Skull and upper cervical spine: Negative for marrow lesion Sinuses/Orbits: Sinus and nasal cavity/nasopharyngeal opacification in the setting of intubation. IMPRESSION: 1. 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 2. Bilateral medial thalamus swelling without restricted diffusion, has there been seizure activity? Also, suggest vitamin replacement given history of ethanol abuse. Electronically Signed   By: Monte Fantasia M.D.   On: 11/16/2018 05:52    Medications:  Scheduled: . aspirin  81 mg Per Tube Daily  . atorvastatin  20 mg Per Tube q1800  . chlorhexidine gluconate (MEDLINE KIT)  15 mL Mouth Rinse BID  . Chlorhexidine Gluconate Cloth  6 each Topical Daily  . Chlorhexidine Gluconate Cloth  6 each Topical Daily  . famotidine  20 mg  Per Tube BID  . feeding supplement (PRO-STAT  SUGAR FREE 64)  30 mL Per Tube BID  . heparin  5,000 Units Subcutaneous Q8H  . insulin aspart  0-15 Units Subcutaneous Q4H  . mouth rinse  15 mL Mouth Rinse 10 times per day  . metoprolol tartrate  5 mg Intravenous Q6H  . rocuronium  50 mg Intravenous Once  . sodium chloride flush  10-40 mL Intracatheter Q12H   Continuous: . sodium chloride Stopped (11/16/18 0449)  . dextrose 5 % and 0.9% NaCl Stopped (11/16/18 0449)  . feeding supplement (VITAL 1.5 CAL) 1,000 mL (11/15/18 1349)  . fentaNYL infusion INTRAVENOUS 150 mcg/hr (11/16/18 0535)  . midazolam 4 mg/hr (11/16/18 0536)  . norepinephrine (LEVOPHED) Adult infusion Stopped (11/14/18 1130)  . propofol (DIPRIVAN) infusion Stopped (11/14/18 0947)    MRI brain: Restricted diffusion in the bilateral caudate. Bilateral medial thalamus swelling without restricted diffusion. 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 Possible low-grade edema along the cortex diffusely and that there is a degree of elevated intracranial pressure-sulci are diffusely effaced. There is increased FLAIR signal the bilateral medial thalamus without restricted diffusion. Findings are most consistent with global anoxic injury.  Assessment: Von Quintanar is a 48 yo M with complex cardiovascular history who experienced cardiac arrest and is now unresponsive.  1. Clinical picture most consistent with diffuse anoxic brain injury.  2. MRI reveals symmetric abnormalities that are also most consistent with global anoxic brain injury. 3.No purposeful movements today. No evidence for higher cortical function on exam. Exam findings suggestive of right worse than left cerebral hemispheric damage.  4. At today's examination, he is nearly 72 hours out from his cardiac arrest. Given exam findings and MRI abnormalities, prognosis is now felt to be poor.  Recommendations: - Continue current ICU tx plan -  Would contact family regarding goals of care.  - Neurology will sign off. Please call if there are additional questions.   40 minutes spent in the neurological evaluation and management of this critically ill patient.    LOS: 3 days   '@Electronically'  signed: Dr. Kerney Elbe 11/16/2018  7:21 AM

## 2018-11-16 NOTE — Plan of Care (Signed)

## 2018-11-17 ENCOUNTER — Inpatient Hospital Stay (HOSPITAL_COMMUNITY): Payer: BC Managed Care – PPO

## 2018-11-17 LAB — CBC
HCT: 35.7 % — ABNORMAL LOW (ref 39.0–52.0)
Hemoglobin: 12 g/dL — ABNORMAL LOW (ref 13.0–17.0)
MCH: 29.1 pg (ref 26.0–34.0)
MCHC: 33.6 g/dL (ref 30.0–36.0)
MCV: 86.4 fL (ref 80.0–100.0)
Platelets: 185 10*3/uL (ref 150–400)
RBC: 4.13 MIL/uL — ABNORMAL LOW (ref 4.22–5.81)
RDW: 13.7 % (ref 11.5–15.5)
WBC: 8.1 10*3/uL (ref 4.0–10.5)
nRBC: 0 % (ref 0.0–0.2)

## 2018-11-17 LAB — PHOSPHORUS: Phosphorus: 1.1 mg/dL — ABNORMAL LOW (ref 2.5–4.6)

## 2018-11-17 LAB — BASIC METABOLIC PANEL
Anion gap: 8 (ref 5–15)
BUN: 13 mg/dL (ref 6–20)
CO2: 28 mmol/L (ref 22–32)
Calcium: 8.6 mg/dL — ABNORMAL LOW (ref 8.9–10.3)
Chloride: 106 mmol/L (ref 98–111)
Creatinine, Ser: 0.78 mg/dL (ref 0.61–1.24)
GFR calc Af Amer: >60 mL/min (ref >60)
GFR calc non Af Amer: >60 mL/min (ref >60)
Glucose, Bld: 122 mg/dL — ABNORMAL HIGH (ref 70–99)
Potassium: 2.9 mmol/L — ABNORMAL LOW (ref 3.5–5.1)
Sodium: 142 mmol/L (ref 135–145)

## 2018-11-17 LAB — MAGNESIUM: Magnesium: 2.1 mg/dL (ref 1.7–2.4)

## 2018-11-17 LAB — GLUCOSE, CAPILLARY
Glucose-Capillary: 112 mg/dL — ABNORMAL HIGH (ref 70–99)
Glucose-Capillary: 107 mg/dL — ABNORMAL HIGH (ref 70–99)
Glucose-Capillary: 121 mg/dL — ABNORMAL HIGH (ref 70–99)
Glucose-Capillary: 126 mg/dL — ABNORMAL HIGH (ref 70–99)

## 2018-11-17 MED ORDER — FENTANYL 2500MCG IN NS 250ML (10MCG/ML) PREMIX INFUSION
0.0000 ug/h | INTRAVENOUS | Status: DC
  Administered 2018-11-17: 18:00:00 25 ug/h via INTRAVENOUS
  Filled 2018-11-17: qty 250

## 2018-11-17 MED ORDER — GLYCOPYRROLATE 0.2 MG/ML IJ SOLN: 0.2000 mg | INTRAMUSCULAR | Status: DC | PRN

## 2018-11-17 MED ORDER — SODIUM PHOSPHATES 45 MMOLE/15ML IV SOLN
30.0000 mmol | Freq: Once | INTRAVENOUS | Status: AC
  Administered 2018-11-17: 30 mmol via INTRAVENOUS
  Filled 2018-11-17: qty 10

## 2018-11-17 MED ORDER — MIDAZOLAM 50MG/50ML (1MG/ML) PREMIX INFUSION
0.5000 mg/h | INTRAVENOUS | Status: DC
  Administered 2018-11-17: 18:00:00 2 mg/h via INTRAVENOUS
  Filled 2018-11-17: qty 50

## 2018-11-17 MED ORDER — MIDAZOLAM HCL 2 MG/2ML IJ SOLN
INTRAMUSCULAR | Status: AC
  Administered 2018-11-17: 19:00:00
  Filled 2018-11-17: qty 2

## 2018-11-17 MED ORDER — POTASSIUM CHLORIDE 20 MEQ/15ML (10%) PO SOLN
40.0000 meq | ORAL | Status: AC
  Administered 2018-11-17 (×2): 40 meq
  Filled 2018-11-17 (×2): qty 30

## 2018-11-17 MED ORDER — GLYCOPYRROLATE 0.2 MG/ML IJ SOLN
0.2000 mg | INTRAMUSCULAR | Status: DC | PRN
  Administered 2018-11-17: 0.2 mg via INTRAVENOUS
  Filled 2018-11-17: qty 1

## 2018-11-17 MED ORDER — FENTANYL CITRATE (PF) 100 MCG/2ML IJ SOLN
25.0000 ug | INTRAMUSCULAR | Status: DC | PRN
  Administered 2018-11-17: 25 ug via INTRAVENOUS
  Filled 2018-11-17: qty 2

## 2018-11-17 MED ORDER — MIDAZOLAM HCL 2 MG/2ML IJ SOLN
2.0000 mg | INTRAMUSCULAR | Status: DC | PRN
  Administered 2018-11-17: 2 mg via INTRAVENOUS

## 2018-11-17 MED ORDER — GLYCOPYRROLATE 1 MG PO TABS
1.0000 mg | ORAL_TABLET | ORAL | Status: DC | PRN
  Filled 2018-11-17: qty 1

## 2018-11-17 DEATH — deceased

## 2018-11-22 ENCOUNTER — Telehealth: Payer: Self-pay

## 2018-11-22 NOTE — Telephone Encounter (Signed)
Received dc from [employer].  DC is for burial and a patient of [facility].   DC will be taken to Pulmonary Unit for signature.  On 11/24/2018 Received signed dc back from Doctor Wert.  I called the funeral home to let them know the dc is ready for pickup.

## 2018-12-18 NOTE — Progress Notes (Signed)
Time of death 38. Izetta Dakin and Ana Combs auscultated for heart sounds for two minutes. None heard. Family notified.

## 2018-12-18 NOTE — Progress Notes (Signed)
Called and spoke with wife at length she changed pt's code DNR she udnerstands his prognosis is poor and he will not have a quality of life he would want should we cont with trach/peg. She does not want further procedures but to focus on comfort until family can come tomorrow.    She is attempting to reach other family to establish plan to come pay respects.     UPDATE: pt became hypertensive and tachycardic then bit down on ett and tongue. Bleeding from tongue and appears to have damaged ett.  sats are maintained in 90's but tv poor about 50%  Call placed back to wife to update her she is en route she understands that the patient may pass away prior to her arrival. She does not want ett replaced in light of his poor prognosis and wants to ensure comfort of pt.   Updated RN at bedside.

## 2018-12-18 NOTE — Death Summary Note (Signed)
DEATH SUMMARY   Patient Details  Name: Tony Reynolds MRN: [id number] DOB: 04-12-1971  Admission/Discharge Information   Admit Date:  2018-11-14  Date of Death: Date of Death: 11/18/18  Time of Death: Time of Death: 09/01/34  Length of Stay: 4  Referring Physician: Celene Squibb, MD   Reason(s) for Hospitalization  vfib arrest  Diagnoses  Preliminary cause of death:  Secondary Diagnoses (including complications and co-morbidities):  Principal Problem:   Cardiac arrest [facility]) Active Problems:   ETOH abuse   Coronary artery disease involving native coronary artery of native heart without angina pectoris   Benign essential HTN   Infection of pacemaker pocket [facility])   ICD (implantable cardioverter-defibrillator) infection ([place])   Ventricular arrhythmia   Chronic systolic CHF (congestive heart failure) [facility])   Brief Hospital Course (including significant findings, care, treatment, and services provided and events leading to death)  Tony Reynolds is a 48 y.o. year old male who has a h/o tobacco abuse and Vfib arrest presenting with another Vfib arrest.  History per Dr. Grayland Ormond note and confirmed with ER staff: "He has had prior cardiac arrest in December 2018. Had nonobstructive coronary disease at that time with reduction in LV function. Subsequently had AICD implantation in the subcutaneous space in January 2019. 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 Patient presented to  emergency room yesterday with an episode of syncope but signed out AMA. He had a "seizure" today with collapse. CPR was not provided by bystanders, EMS arrived 6 to 7 minutes after collapse. At least 6 minutes of CPR was provided with 2 epinephrine and defibrillation from ventricular fibrillation-->asystole-->SVT/A. Fib-->sinus rhythm."  Intubated in field.  Did not fully wake up but was moving extremities and now is sedated. Intensivist service has been asked to admit.  (of note I  provided care for the pt for one day on 11/18/2022, please obtain details from pt's chart).  UPDATE 7/9, by Audria Nine: pt had MRI overnight on 11/18/2022 and findings c/w anoxic injury. Wife was in preparations to call in family for comfort care which would have been in line with pt's previously expressed wishes should he have to remain on lifesupport. Unfortunately, during the course of the day the pt had suspected sz event and bit thru ett. His wife requested to not exchange the ett and she was able to arrive by the pt's side quickly. The pt was compassionately extubated and expired per RN note at 53 with wife at the pt's side.     Pertinent Labs and Studies  Significant Diagnostic Studies Ct Head Wo Contrast  Result Date: 2018/11/14 CLINICAL DATA:  Seizure activity, STEMI, altered level of consciousness, history hypertension EXAM: CT HEAD WITHOUT CONTRAST TECHNIQUE: Contiguous axial images were obtained from the base of the skull through the vertex without intravenous contrast. Sagittal and coronal MPR images reconstructed from axial data set. COMPARISON:  None FINDINGS: Brain: Normal ventricular morphology. No midline shift or mass effect. Normal appearance of brain parenchyma. No intracranial hemorrhage, mass lesion, evidence of acute infarction, or extra-axial fluid collection. Vascular: No hyperdense vessels Skull: Intact Sinuses/Orbits: Clear Other: N/A IMPRESSION: No acute intracranial abnormalities. Electronically Signed   By: Lavonia Dana M.D.   On: 2018-11-14 16:06   Mr Brain Wo Contrast  Result Date: 11/16/2018 CLINICAL DATA:  Anoxic brain injury EXAM: MRI HEAD WITHOUT CONTRAST TECHNIQUE: Multiplanar, multiecho pulse sequences of the brain and surrounding structures were obtained without intravenous contrast. COMPARISON:  Head CT from  3 days ago FINDINGS: Brain: Restricted diffusion in the bilateral caudate. Small acute infarct in the right centrum semiovale and subtle increased diffusion signal  along the bilateral central sulcus. These findings are consistent with global anoxic injury. Possible low-grade edema along the cortex diffusely and that there is a degree of elevated intracranial pressure-sulci are diffusely effaced. There is increased FLAIR signal the bilateral medial thalamus without restricted diffusion. No definite abnormal signal in the brainstem. No acute hemorrhage, hydrocephalus, or midline shift. Vascular: Major intracranial flow voids are preserved Skull and upper cervical spine: Negative for marrow lesion Sinuses/Orbits: Sinus and nasal cavity/nasopharyngeal opacification in the setting of intubation. IMPRESSION: 1. 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 2. Bilateral medial thalamus swelling without restricted diffusion, has there been seizure activity? Also, suggest vitamin replacement given history of ethanol abuse. Electronically Signed   By: Marnee SpringJonathon  Watts M.D.   On: 11/16/2018 05:52   Dg Chest Portable 1 View  Result Date: 12/16/2018 CLINICAL DATA:  Respiratory failure. EXAM: PORTABLE CHEST 1 VIEW COMPARISON:  Radiograph of November 14, 2018. FINDINGS: Stable cardiomediastinal silhouette. Endotracheal and nasogastric tubes are unchanged in position. Right internal jugular catheter is unchanged. No pneumothorax is noted. Mild bibasilar subsegmental atelectasis is noted. No significant pleural effusion is noted. Bony thorax is unremarkable. IMPRESSION: Stable support apparatus. Mild bibasilar subsegmental atelectasis is noted. Electronically Signed   By: Lupita RaiderJames  Green Jr M.D.   On: 11/23/2018 08:54   Dg Chest Port 1 View  Result Date: 11/14/2018 CLINICAL DATA:  Cardiac arrest EXAM: PORTABLE CHEST 1 VIEW COMPARISON:  Chest x-rays dated 10/25/2018 and 08/23/18 FINDINGS: Endotracheal tube is adequately positioned with tip at the level of the clavicles. Enteric tube passes below the diaphragm. RIGHT IJ central line remains adequately positioned with tip at the level of the  mid/lower SVC. Pacer pads overlie the LEFT heart. Heart size and mediastinal contours are stable. Lungs are clear. No pleural effusion or pneumothorax seen. IMPRESSION: 1. Support apparatus appears stable in position. 2. Lungs are clear.  No evidence of pneumonia or pulmonary edema. Electronically Signed   By: Bary RichardStan  Maynard M.D.   On: 11/14/2018 08:04   Dg Chest Port 1 View  Result Date: 11/12/2018 CLINICAL DATA:  Central line placement EXAM: PORTABLE CHEST 1 VIEW COMPARISON:  Portable exam 1820 hours compared to 1523 hours FINDINGS: Tip of endotracheal tube projects 5.5 cm above carina. Nasogastric tube tip projects over a distal esophagus, recommend advancing tube 15 cm to place proximal side-port within stomach. RIGHT jugular central venous catheter with tip projecting over SVC. External pacing leads noted. Normal heart size, mediastinal contours, and pulmonary vascularity. Lungs clear. No infiltrate, pleural effusion, or pneumothorax. IMPRESSION: No pneumothorax following RIGHT jugular line placement. Recommend advancing nasogastric tube 15 cm to place proximal side-port within stomach. Electronically Signed   By: Ulyses SouthwardMark  Boles M.D.   On: 10/29/2018 18:48   Dg Chest Portable 1 View  Result Date: 10/28/2018 CLINICAL DATA:  S/p VFib EXAM: PORTABLE CHEST 1 VIEW COMPARISON:  08/23/18, 10:23 a.m. FINDINGS: Interval placement of endotracheal tube, tip below the thoracic inlet. Interval placement of esophagogastric tube, tip at the gastroesophageal junction and side port over the lower esophagus. Recommend advancement. Otherwise unchanged AP portable examination. No acute airspace opacity. IMPRESSION: 1. Interval placement of endotracheal tube, tip below the thoracic inlet. 2. Interval placement of esophagogastric tube, tip at the gastroesophageal junction and side port over the lower esophagus. Recommend advancement. 3. Otherwise unchanged AP portable examination. No acute airspace  opacity. Electronically  Signed   By: Lauralyn Primes M.D.   On: 11/08/2018 15:34   Dg Chest Port 1 View  Result Date: 10/29/2018 CLINICAL DATA:  Chest pain EXAM: PORTABLE CHEST 1 VIEW COMPARISON:  07/17/2017 FINDINGS: The heart size and mediastinal contours are within normal limits. Both lungs are clear. The visualized skeletal structures are unremarkable. IMPRESSION: No active disease. Electronically Signed   By: Signa Kell M.D.   On: 11/05/2018 10:35   Dg Abd Portable 1v  Result Date: 11/14/2018 CLINICAL DATA:  OG tube placement EXAM: PORTABLE ABDOMEN - 1 VIEW COMPARISON:  None. FINDINGS: The OG tube coils within the gastric body with the tip terminating over the gastric antrum/pylorus. The visualized bowel gas pattern is nonspecific. There may be a few dilated loops of small bowel in the upper abdomen. IMPRESSION: 1. Enteric tube tip projects over the gastric antrum/pylorus. The tip is pointed distally. 2. There appear to be a few mildly dilated loops of small bowel in the upper abdomen. Electronically Signed   By: Katherine Mantle M.D.   On: 11/14/2018 02:01   Dg Abd Portable 1v  Result Date: 10/31/2018 CLINICAL DATA:  48 year old male status post enteric tube placement. EXAM: PORTABLE ABDOMEN - 1 VIEW COMPARISON:  None. FINDINGS: Partially visualized enteric tube with side-port just distal to the gastroesophageal junction and tip in the proximal gastric body. Recommend further advancing the tube by approximately 5 cm for optimal positioning. Air noted within the colon. IMPRESSION: Enteric tube with tip in the proximal gastric body. Electronically Signed   By: Elgie Collard M.D.   On: 10/29/2018 21:26    Microbiology No results found for this or any previous visit (from the past 240 hour(s)).  Lab Basic Metabolic Panel: No results for input(s): NA, K, CL, CO2, GLUCOSE, BUN, CREATININE, CALCIUM, MG, PHOS in the last 168 hours. Liver Function Tests: No results for input(s): AST, ALT, ALKPHOS, BILITOT, PROT,  ALBUMIN in the last 168 hours. No results for input(s): LIPASE, AMYLASE in the last 168 hours. No results for input(s): AMMONIA in the last 168 hours. CBC: No results for input(s): WBC, NEUTROABS, HGB, HCT, MCV, PLT in the last 168 hours. Cardiac Enzymes: No results for input(s): CKTOTAL, CKMB, CKMBINDEX, TROPONINI in the last 168 hours. Sepsis Labs: No results for input(s): PROCALCITON, WBC, LATICACIDVEN in the last 168 hours.  Procedures/Operations  See chart.    Briant Sites 11/25/2018, 9:30 AM

## 2018-12-18 NOTE — Progress Notes (Signed)
RT Note: RT was called to pt's room by RN. Pt had low exhale Vt and Ve. RN and RT attempt to suction pt. Placed a bite block in place due to pt biting down on tube. Pt continued to have low Vt and Ve. Dr. Ruthann Cancer was called to see how to continue on, in case we had to switch out ETT. Dr. Ruthann Cancer stated that she has talked to wife of pt and will be on here way to the hospital. Per Dr. Ruthann Cancer, she stated to hold off on ETT exchange at this time until wife gets to hospital.

## 2018-12-18 NOTE — Progress Notes (Signed)
100 mL of fentanly and 10 mL of versed wasted in sink. Witnessed by Rosalio Macadamia, RN.

## 2018-12-18 NOTE — Progress Notes (Signed)
NAME:  Tony Reynolds, MRN:  [id number], DOB:  [birth year], LOS: 4 ADMISSION DATE:  11/07/2018, CONSULTATION DATE:  10/28/2018 REFERRING MD:  ER, CHIEF COMPLAINT:  Arrest   Brief History   48 year old man with mixed ischemic/nonischemic cardiomyopathy, prior Vfib arrest presenting with Vfib arrest. Hypothermia protocol-cooled to 33 but became bradycardic Cooling goals was changed Rewarmed-goal achieved about 9 PM 11/14/2018  History of present illness   48 year old smoker with history of Vfib arrest presenting with another Vfib arrest.  History per Dr. Corky SingHenry Smith's note and confirmed with ER staff: "He has had prior cardiac arrest in December 2018.  Had nonobstructive coronary disease at that time with reduction in LV function.  Subsequently had AICD implantation in the subcutaneous space in January 2019.  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  Patient presented to  emergency room yesterday with an episode of syncope but signed out AMA.  He had a "seizure" today with collapse.  CPR was not provided by bystanders, EMS arrived 6 to 7 minutes after collapse.  At least 6 minutes of CPR was provided with 2 epinephrine and defibrillation from ventricular fibrillation--> asystole--> SVT/A. Fib--> sinus rhythm."  Intubated in field.  Did not fully wake up but was moving extremities and now is sedated. Intensivist service has been asked to admit.  Past Medical History  Nonischemic cardiomyopathy although has a pretty good LAD lesion I guess with TIMI 3 flow so thought noncontributory.  Smoker  Prior Vfib arrest  Prior AICD complicated by infection and explantation  Significant Hospital Events   10/28/2018 Admission  Consults:  10/26/2018 Cardiology and hopefully EP 6/29 neuro  Procedures:  10/18/2018 Intubation in field 6/27 RIJ CVC 6/27 Rrad aline  Significant Diagnostic Tests:  10/25/2018 CT Head:>>>neg acute  MRI brain 6/30>>> 1. Global anoxic injury without shift or  herniation. 2. Bilateral medial thalamus swelling without restricted diffusion, has there been seizure activity? Also, suggest vitamin replacement given history of ethanol abuse.  6/28 echo:   1. The left ventricle has normal systolic function, with an ejection fraction of 55-60%. The cavity size was normal. There is moderately increased left ventricular wall thickness. Left ventricular diastolic parameters were normal.  2. The right ventricle has normal systolic function. The cavity was normal. There is no increase in right ventricular wall thickness.  3. No evidence of mitral valve stenosis.  4. The aortic valve is tricuspid. Mild thickening of the aortic valve. Mild calcification of the aortic valve. No stenosis of the aortic valve. Mild aortic annular calcification noted.  5. The aortic root is normal in size and structure.  6. Pulmonary hypertension is indeterminate, inadequate TR jet.  6/30 MRI Brain:  1. 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 2. Bilateral medial thalamus swelling without restricted diffusion, has there been seizure activity? Also, suggest vitamin replacement given history of ethanol abuse.   Micro Data:  11/09/2018: COVID negative 10/29/2018: HIV screen negative  Antimicrobials:  None   Interim history/subjective:  7/1: anoxic findings on MRI brain, not breathing over vent this am when I personally performed sbt (brief)  D/w wife via phone end of life care or other avenues with trach/peg vent dependent life. She states that is not what he would want. She will d/w other family members and RN to determine who can come visit with pt. It is likely she will move to compassionate extubation with focus on comfort.   Objective   Blood pressure 129/79, pulse  84, temperature 100.2 F (37.9 C), resp. rate 13, weight 80 kg, SpO2 100 %. CVP:  [8 mmHg-14 mmHg] 9 mmHg  Vent Mode: PRVC FiO2 (%):  [40 %] 40 % Set Rate:  [18 bmp] 18 bmp Vt Set:  [510 mL] 510 mL  PEEP:  [5 cmH20] 5 cmH20 Pressure Support:  [10 cmH20] 10 cmH20 Plateau Pressure:  [18 cmH20-22 cmH20] 18 cmH20   Intake/Output Summary (Last 24 hours) at 16-Dec-2018 3094 Last data filed at 12-16-2018 0700 Gross per 24 hour  Intake 2556.12 ml  Output 2145 ml  Net 411.12 ml   Filed Weights   11/15/18 0500 11/16/18 0500 December 16, 2018 0407  Weight: 81.1 kg 80.1 kg 80 kg    Examination:  General:  Young male, NAD on vent HEENT: NCAT, pupils non reactive, MM pink/moist Neuro: unresponsive, no spontaneous movement, no withdraw to painful stim, no gag  CV: s1s2 rrr, no m/r/g PULM: even/non-labored, lungs bilaterally clear MH:WKGS, non-tender, bsx4 active  Extremities: warm/dry, scant BLE edema  Skin: no rashes or lesions  Resolved Hospital Problem list   None  Assessment & Plan:   Ventricular fibrillation, status post cardioversion, about 15 minutes before return of spontaneous circulation in about 7 minutes downtime prior to initiation of CPR Acute on chronic systolic heart failure, prior cardiac catheterization January 2019-nonobstructive coronary artery disease, 50% mid LAD, 75% diagonal - continue normothermia -Now rewarmed -Continue Versed -cardiology following  -likely poor neurologic prognosis as below   Hypertension -continue metoprolol  -Cardiology following -Bradycardia resolved  Hypokalemia -replace -Mag 2.1  Encephalopathy Anoxic brian injury  -EEG is suggestive of hypoxemic injury -MRI with global anoxic injury  -Neurology has signed off, poor prognosis.  -will d/c all sedation to ensure best opportunity for mental status at this time. However pt did not produce effort when changed to sbt today.   Lactic acidosis, improving -recheck  Normocytic Anemia:  Appears acute Transfuse <7 No overt bleeding noted   Acute Hypoxemic respiratory failure -Continue ventilator support -Tolerating pressure support ventilation  - mental status does not support  extubation   Elevated troponin -Likely related to demand ischemia from cardiac arrest -Continue to monitor -Cardiology following  Hypoglycemia - improved  -cont tf to stop d5 0.45 saline  Best practice:  Diet: TF Pain/Anxiety/Delirium protocol (if indicated): As needed VAP protocol (if indicated): ordered 6/27 DVT prophylaxis: Lovenox GI prophylaxis: Pepcid Glucose control: SSI Mobility: Bedrest Code Status: full Family Communication: d/w wife via phone (please see above) Disposition: ICU  Labs   CBC: Recent Labs  Lab 11/11/2018 1009 10/22/2018 1513  10/20/2018 2212  11/14/18 0533  11/14/18 2040 11/14/18 2159 11/15/18 0338 11/16/18 0340 2018-12-16 0346  WBC 6.7 8.0  --  12.3*  --  11.1*  --   --   --  11.0* 10.3 8.1  NEUTROABS 4.4 3.9  --   --   --   --   --   --   --   --   --   --   HGB 15.2 15.1   < > 15.5   < > 14.5   < > 15.3 14.3 14.2 13.4 12.0*  HCT 44.7 44.4   < > 44.5   < > 41.8   < > 45.0 42.0 41.7 39.2 35.7*  MCV 85.3 85.9  --  84.4  --  84.1  --   --   --  85.1 85.0 86.4  PLT 205 240  --  238  --  218  --   --   --  183 188 185   < > = values in this interval not displayed.    Basic Metabolic Panel: Recent Labs  Lab 11/07/2018 1513  11/14/18 0533  11/14/18 0834  11/14/18 2040 11/14/18 2159 11/15/18 0338 11/15/18 1526 11/16/18 0340 11/16/18 1832 11/18/2018 0346  NA 139   < > 141   < > 143   < > 146* 146* 142  --  141  --  142  K 3.2*   < > 3.8   < > 3.5   < > 3.9 4.1 3.2*  --  3.6  --  2.9*  CL 107   < > 115*  --  116*  --   --   --  111  --  108  --  106  CO2 15*  --  16*  --   --   --   --   --  23  --  28  --  28  GLUCOSE 241*   < > 127*  --  111*   < > 77 76 81  --  115*  --  122*  BUN 13   < > 22*  --  21*  --   --   --  13  --  11  --  13  CREATININE 1.73*   < > 1.00  --  0.80  --   --   --  0.88  --  0.72  --  0.78  CALCIUM 8.8*  --  8.0*  --   --   --   --   --  8.4*  --  8.5*  --  8.6*  MG 2.0  --  2.5*  --   --   --   --   --  1.9  --  2.2  --   2.1  PHOS  --   --   --   --   --   --   --   --   --  3.2 2.1* 2.0* 1.1*   < > = values in this interval not displayed.   GFR: Estimated Creatinine Clearance: 115.9 mL/min (by C-G formula based on SCr of 0.78 mg/dL). Recent Labs  Lab 10/28/2018 1632 10/18/2018 1850  11/14/18 0533 11/15/18 0338 11/16/18 0340 12/16/2018 0346  WBC  --   --    < > 11.1* 11.0* 10.3 8.1  LATICACIDVEN 3.3* 3.0*  --   --   --   --   --    < > = values in this interval not displayed.    Liver Function Tests: Recent Labs  Lab November 07, 2018 1009 11/10/2018 1513  AST 35 142*  ALT 27 117*  ALKPHOS 89 116  BILITOT 0.5 0.5  PROT 8.0 6.6  ALBUMIN 4.5 3.7   Recent Labs  Lab 10/30/2018 1513  LIPASE 42   No results for input(s): AMMONIA in the last 168 hours.  ABG    Component Value Date/Time   PHART 7.381 11/14/2018 0541   PCO2ART 25.6 (L) 11/14/2018 0541   PO2ART 153.0 (H) 11/14/2018 0541   HCO3 16.0 (L) 11/14/2018 0541   TCO2 18 (L) 11/14/2018 0834   ACIDBASEDEF 9.0 (H) 11/14/2018 0541   O2SAT 99.0 11/14/2018 0541     Coagulation Profile: Recent Labs  Lab 10/26/2018 1850 11/14/18 0221  INR 1.0 1.0    Cardiac Enzymes: No results for input(s): CKTOTAL, CKMB, CKMBINDEX, TROPONINI in the last 168 hours.  HbA1C: Hgb A1c MFr Bld  Date/Time Value Ref  Range Status  11/10/2018 04:32 PM 5.7 (H) 4.8 - 5.6 % Final    Comment:    REPEATED TO VERIFY (NOTE) Pre diabetes:          5.7%-6.4% Diabetes:              >6.4% Glycemic control for   <7.0% adults with diabetes     CBG: Recent Labs  Lab 11/16/18 1132 11/16/18 1541 11/16/18 1938 11/16/18 2355 11/19/18 0331  GLUCAP 93 107* 99 137* 112*     Critical care time: The patient is critically ill with multiple organ systems failure and requires high complexity decision making for assessment and support, frequent evaluation and titration of therapies, application of advanced monitoring technologies and extensive interpretation of multiple  databases.  Critical care time 48 mins. This represents my time independent of the NPs time taking care of the pt. This is excluding procedures.    Audria Nine DO Pager: [phone] After hours pager: [phone]  [facility] Nov 19, 2018, 7:27 AM

## 2018-12-18 NOTE — Procedures (Signed)
Extubation Procedure Note  Patient Details:   Name: Tony Reynolds DOB: Aug 09, 1970 MRN: [id number]   Airway Documentation:    Vent end date: (not recorded) Vent end time: (not recorded)   Evaluation Pt extubated per physician order and family request to comfort care. RN at bedside.  Jorje Guild 12/17/2018, [phone]

## 2018-12-18 NOTE — Progress Notes (Signed)
CRITICAL VALUE ALERT  Critical Value:  Potassium 2.9, phosphorus 1.1  Date & Time Notied:  11-Dec-2018 0450   Provider Notified: e-link  Orders Received/Actions taken: yes

## 2018-12-18 NOTE — Progress Notes (Signed)
eLink Physician-Brief Progress Note Patient Name: Tony Reynolds DOB: [birth year] MRN: [id number]   Date of Service  11/20/2018  HPI/Events of Note  K+ 2.9, PO4 1.1  eICU Interventions  Elink electrolyte replacement protocol for K+ and PO4        [name] 12/04/2018, 6:52 AM

## 2018-12-18 NOTE — Progress Notes (Signed)
78ml of versed and 50 ml of fentanyl wasted with Lesli Albee RN

## 2018-12-18 DEATH — deceased

## 2024-06-20 ENCOUNTER — Encounter: Payer: Self-pay | Admitting: Cardiology
